# Patient Record
Sex: Female | Born: 1950 | Race: White | Hispanic: No | Marital: Married | Smoking: Former smoker
Health system: Southern US, Community
[De-identification: ages and names within clinical notes are randomized; demographics above are authoritative.]

## PROBLEM LIST (undated history)

## (undated) DIAGNOSIS — K219 Gastro-esophageal reflux disease without esophagitis: Secondary | ICD-10-CM

## (undated) DIAGNOSIS — K2270 Barrett's esophagus without dysplasia: Secondary | ICD-10-CM

## (undated) DIAGNOSIS — K317 Polyp of stomach and duodenum: Secondary | ICD-10-CM

## (undated) DIAGNOSIS — J439 Emphysema, unspecified: Secondary | ICD-10-CM

## (undated) DIAGNOSIS — T7840XA Allergy, unspecified, initial encounter: Secondary | ICD-10-CM

## (undated) DIAGNOSIS — I2510 Atherosclerotic heart disease of native coronary artery without angina pectoris: Secondary | ICD-10-CM

## (undated) DIAGNOSIS — M1990 Unspecified osteoarthritis, unspecified site: Secondary | ICD-10-CM

## (undated) DIAGNOSIS — K2210 Ulcer of esophagus without bleeding: Secondary | ICD-10-CM

## (undated) DIAGNOSIS — B029 Zoster without complications: Secondary | ICD-10-CM

## (undated) DIAGNOSIS — K222 Esophageal obstruction: Secondary | ICD-10-CM

## (undated) HISTORY — PX: JOINT REPLACEMENT: SHX530

## (undated) HISTORY — DX: Emphysema, unspecified: J43.9

## (undated) HISTORY — PX: TOTAL ABDOMINAL HYSTERECTOMY W/ BILATERAL SALPINGOOPHORECTOMY: SHX83

## (undated) HISTORY — DX: Esophageal obstruction: K22.2

## (undated) HISTORY — PX: COMPLETE MASTECTOMY W/ SENTINEL NODE BIOPSY: SHX1383

## (undated) HISTORY — PX: CARPAL TUNNEL RELEASE: SHX101

## (undated) HISTORY — DX: Zoster without complications: B02.9

## (undated) HISTORY — DX: Unspecified osteoarthritis, unspecified site: M19.90

## (undated) HISTORY — DX: Ulcer of esophagus without bleeding: K22.10

## (undated) HISTORY — PX: OTHER SURGICAL HISTORY: SHX169

## (undated) HISTORY — DX: Atherosclerotic heart disease of native coronary artery without angina pectoris: I25.10

## (undated) HISTORY — DX: Polyp of stomach and duodenum: K31.7

## (undated) HISTORY — PX: ABDOMINAL HYSTERECTOMY: SHX81

## (undated) HISTORY — PX: RADIOFREQUENCY ABLATION: SHX2290

## (undated) HISTORY — DX: Gastro-esophageal reflux disease without esophagitis: K21.9

## (undated) HISTORY — PX: CHOLECYSTECTOMY: SHX55

## (undated) HISTORY — DX: Allergy, unspecified, initial encounter: T78.40XA

---

## 2009-01-01 ENCOUNTER — Ambulatory Visit: Payer: Self-pay | Admitting: Family Medicine

## 2009-01-01 DIAGNOSIS — S838X9A Sprain of other specified parts of unspecified knee, initial encounter: Secondary | ICD-10-CM | POA: Insufficient documentation

## 2009-01-01 DIAGNOSIS — S86819A Strain of other muscle(s) and tendon(s) at lower leg level, unspecified leg, initial encounter: Secondary | ICD-10-CM

## 2009-01-01 DIAGNOSIS — M25569 Pain in unspecified knee: Secondary | ICD-10-CM | POA: Insufficient documentation

## 2009-04-03 ENCOUNTER — Ambulatory Visit: Payer: Self-pay | Admitting: Family Medicine

## 2009-04-03 DIAGNOSIS — B029 Zoster without complications: Secondary | ICD-10-CM | POA: Insufficient documentation

## 2010-08-10 HISTORY — PX: NISSEN FUNDOPLICATION: SHX2091

## 2010-08-12 ENCOUNTER — Encounter: Payer: Self-pay | Admitting: Family Medicine

## 2010-08-12 ENCOUNTER — Ambulatory Visit
Admission: RE | Admit: 2010-08-12 | Discharge: 2010-08-12 | Payer: Self-pay | Source: Home / Self Care | Admitting: Family Medicine

## 2010-08-12 DIAGNOSIS — L989 Disorder of the skin and subcutaneous tissue, unspecified: Secondary | ICD-10-CM | POA: Insufficient documentation

## 2010-08-12 DIAGNOSIS — K449 Diaphragmatic hernia without obstruction or gangrene: Secondary | ICD-10-CM | POA: Insufficient documentation

## 2010-08-12 LAB — CONVERTED CEMR LAB
Bilirubin Urine: NEGATIVE
Blood Glucose, Fingerstick: 106
Nitrite: NEGATIVE
Protein, U semiquant: NEGATIVE
pH: 6

## 2010-08-13 ENCOUNTER — Encounter: Payer: Self-pay | Admitting: Family Medicine

## 2010-08-13 LAB — CONVERTED CEMR LAB
ALT: 26 units/L (ref 0–35)
AST: 34 units/L (ref 0–37)
BUN: 18 mg/dL (ref 6–23)
CO2: 26 meq/L (ref 19–32)
Calcium: 9.4 mg/dL (ref 8.4–10.5)
Chloride: 102 meq/L (ref 96–112)
Creatinine, Ser: 0.88 mg/dL (ref 0.40–1.20)
Hgb A1c MFr Bld: 5.6 % (ref <5.7)
Lipase: <10 units/L (ref 0–75)
Sed Rate: 10 mm/hr (ref 0–22)
Total Bilirubin: 0.7 mg/dL (ref 0.3–1.2)

## 2010-08-15 ENCOUNTER — Ambulatory Visit
Admission: RE | Admit: 2010-08-15 | Discharge: 2010-08-15 | Payer: Self-pay | Source: Home / Self Care | Attending: Family Medicine | Admitting: Family Medicine

## 2010-08-15 DIAGNOSIS — K219 Gastro-esophageal reflux disease without esophagitis: Secondary | ICD-10-CM | POA: Insufficient documentation

## 2010-08-31 ENCOUNTER — Encounter: Payer: Self-pay | Admitting: Orthopedic Surgery

## 2010-09-01 ENCOUNTER — Encounter: Payer: Self-pay | Admitting: Family Medicine

## 2010-09-05 ENCOUNTER — Ambulatory Visit: Admit: 2010-09-05 | Payer: Self-pay | Admitting: Family Medicine

## 2010-09-10 DIAGNOSIS — K2210 Ulcer of esophagus without bleeding: Secondary | ICD-10-CM

## 2010-09-10 HISTORY — DX: Ulcer of esophagus without bleeding: K22.10

## 2010-09-11 NOTE — Assessment & Plan Note (Signed)
Summary: Change medication  Insurance company will not pay for Avelox.  Will substitute Cipro and metronidazole. Donna Christen MD  August 12, 2010 7:45 PM    Prescriptions: METRONIDAZOLE 500 MG TABS (METRONIDAZOLE) One by mouth q6hr  #28 x 0   Entered and Authorized by:   Donna Christen MD   Signed by:   Donna Christen MD on 08/12/2010   Method used:   Electronically to        [facility] [phone]* (retail)       [address] [place], [place]  [address]       Ph: [phone] or [phone]       Fax: [phone]   RxID:   [id number] CIPROFLOXACIN HCL 750 MG TABS (CIPROFLOXACIN HCL) One by mouth two times a day  #14 x 0   Entered and Authorized by:   Donna Christen MD   Signed by:   Donna Christen MD on 08/12/2010   Method used:   Elect[facility]ically to        [facility] [phone]* (retail)       [address] [place], [place]  [address]       Ph: [phone] or [phone]       Fax: [phone]   RxID:   [id number]

## 2010-09-11 NOTE — Assessment & Plan Note (Signed)
Summary: NAUESA/ PAIN IN Rachel Carr /EAR PAIN/LEG PAIN (rm 3)   Vital Signs:  Patient Profile:   60 Years Old Female CC:      N/V x 3 months, throat "discomfort" Height:     62 inches Weight:      183 pounds O2 Sat:      97 % O2 treatment:    Room Air Temp:     98.7 degrees F oral Pulse rate:   95 / minute Resp:     18 per minute BP sitting:   120 / 79  (left arm) Cuff size:   regular  Vitals Entered By: Lajean Saver RN (August 12, 2010 9:23 AM)             Is Patient Diabetic? No CBG Result 106      Updated Prior Medication List: No Medications Current Allergies: No known allergies History of Present Illness Chief Complaint: N/V x 3 months, throat "discomfort" History of Present Illness:  Subjective:  Patient complains of several problems: 1)  For about 3 months she has had persistent nocturnal epigastric burning usually associated with nausea and occasional vomiting.  These symptoms usually awaken her at about midnight.  She had no response from Zantac 150mg  daily for about 2 weeks.  The symptoms improve daytime, and she usually only has occasional nausea after eating, but not consistent.  She has seen occasional bright blood in her stool but notes that she has a history of hemorrhoids.  No changes in stool caliber. 2)  During the past 2 to 3 days she has developed URI symptoms with sore throat, nasal congestion, and cough.  No fever, although she has felt warm.  No pleuritic pain or shortness of breath.  She states that she had pneumonia last year. She has been more fatigued than usual.  She smokes, but has almost quit. 3)  She notes that she has had increased urine frequency for about 3 months and often feels thirsty. 4)  She complains of a mole on her left chest lateral to the breast that has changed color, but has not been painful.  Past history of cholecystectomy, partial hysterectomy because of endometriosis (left ovary remains), and appendectomy. Family  history of diabetes in 4 sisters.  REVIEW OF SYSTEMS Constitutional Symptoms      Denies fever, chills, night sweats, weight loss, weight gain, and fatigue.  Eyes       Denies change in vision, eye pain, eye discharge, glasses, contact lenses, and eye surgery. Ear/Nose/Throat/Mouth       Complains of ear pain and sinus problems.      Denies hearing loss/aids, change in hearing, ear discharge, dizziness, frequent runny nose, frequent nose bleeds, sore throat, hoarseness, and tooth pain or bleeding.  Respiratory       Complains of productive cough.      Denies dry cough, wheezing, shortness of breath, asthma, bronchitis, and emphysema/COPD.  Cardiovascular       Denies murmurs, chest pain, and tires easily with exhertion.    Gastrointestinal       Complains of nausea/vomiting and blood in bowel movements.      Denies stomach pain, diarrhea, constipation, and indigestion. Genitourniary       Denies painful urination, kidney stones, and loss of urinary control. Neurological       Complains of tingling.      Denies paralysis, seizures, and fainting/blackouts.      Comments: tingling in right thigh Musculoskeletal  Denies muscle pain, joint pain, joint stiffness, decreased range of motion, redness, swelling, muscle weakness, and gout.  Skin       Denies bruising, unusual mles/lumps or sores, and hair/skin or nail changes.  Psych       Denies mood changes, temper/anger issues, anxiety/stress, speech problems, depression, and sleep problems. Other Comments: Patinet c/o of vomiting 2-3 xs/week x 3 months which she attributed to stress. For the past 2 weeks she has has blood in her stool. She also c/o feeling like something is "stuck in her throat".  2 days ago she developed a cold. Her appetite is unchanged an denies fever. She stopped seeingher PCP because she felt they didnt give her attentive care. I gave heer a card for [place]   Past History:  Past Medical History: Reviewed history  from 01/01/2009 and no changes required. Unremarkable  Past Surgical History: Reviewed history from 01/01/2009 and no changes required. Rt knee surgery Cholecystectomy Hysterectomy  Family History: Mother, Heart Disease Father, D, Prostate CA Brother, Kidney CA Sister, Esophogus CA, DM  Social History: Reviewed history from 01/01/2009 and no changes required. 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  Extraocular movement is intact.  Conjunctivae are not inflamed.  Ears:  Canals normal.  Tympanic membranes normal.   Nose:  Normal septum.  Normal turbinates, mildly congested.  Mild maxillary sinus tenderness present.  Pharynx:  Normal  Neck:  Supple.  Slightly tender shotty posterior nodes are palpated bilaterally.  Lungs:  Clear to auscultation.  Breath sounds are equal.  Heart:  Regular rate and rhythm without murmurs, rubs, or gallops.  Abdomen:  Vague sub-xiphoid epigastric tenderness without masses or hepatosplenomegaly.  There is also mild tenderness over the descending colon.   Bowel sounds are present.  No CVA or flank tenderness.  Extremities:  No edema.  Pedal pulses are full and equal.  Skin:  On the left chest above breast is a benign appearing tan nodule with discrete edges that is suggestive of a seborrheic keratosis. CBC: 12.7 with increased GR 78.5; Hgb 13.9 Assessment New Problems: DIABETES MELLITUS, FAMILY HX (ICD-V18.0) SKIN LESION (ICD-709.9) ABDOMINAL PAIN (ICD-789.00) UPPER RESPIRATORY INFECTION, ACUTE (ICD-465.9) DIAPHRAGMAT HERN W/O MENTION OBSTRUCTION/GANGREN (ICD-553.3)  SUSPECT COMBINATION OF GERD (?ULCER) AND POSSIBLE DIVERTICULITIS.  NOW WITH ONSET OF VIRAL URI.  PATIENT AT RISK FOR TYPE 2 DIABETES.  Plan New Medications/Changes: BENZONATATE 200 MG CAPS (BENZONATATE) One by mouth hs as needed cough   #12 x 0, 08/12/2010, Donna Christen MD AVELOX 400 MG TABS (MOXIFLOXACIN HCL) 1 by mouth daily  #7 x 0, 08/12/2010, Donna Christen MD OMEPRAZOLE 20 MG CPDR (OMEPRAZOLE) 1 by mouth qpm, before eating  #15 x 2, 08/12/2010, Donna Christen MD  New Orders: Urinalysis [CPT-81003] CBC w/Diff [60454-09811] T-CMP with estimated GFR [[address]] T-Amylase [82150-23210] T-Lipase [83690-23215] T- Sed rate non-auto [85651] T- Hemoglobin A1C [83036-23375] Est. Patient Level IV [91478] Planning Comments:   Will treat empirically for diverticulitis:  begin clear liquids and slowly advance.  Begin Avelox for one week. Begin trial of omeprazole for GERD Treat URI with expectorant and  cough suppressant at bedtime. Labs pending:  CMP, amylase/lipase, Sed rate, Hgb A1c Follow-up with PCP within one week.  May need GI referral.  Consider biopsy of lesion left chest. Encouraged to discontinue smoking. Recommend flu shot when well Return for worsening symptoms.  The patient and/or caregiver has been counseled thoroughly with regard to medications prescribed including dosage, schedule, interactions, rationale for use, and possible side effects and they verbalize understanding.  Diagnoses and expected course of recovery discussed and will return if not improved as expected or if the condition worsens. Patient and/or caregiver verbalized understanding.  Prescriptions: BENZONATATE 200 MG CAPS (BENZONATATE) One by mouth hs as needed cough  #12 x 0   Entered and Authorized by:   Donna Christen MD   Signed by:   Donna Christen MD on 08/12/2010   Method used:   Print then Give to Patient   RxID:   [id number] AVELOX 400 MG TABS (MOXIFLOXACIN HCL) 1 by mouth daily  #7 x 0   Entered and Authorized by:   Donna Christen MD   Signed by:   Donna Christen MD on 08/12/2010   Method used:   Print then Give to Patient   RxID:   [id number] OMEPRAZOLE 20 MG CPDR (OMEPRAZOLE) 1 by mouth qpm, before eating   #15 x 2   Entered and Authorized by:   Donna Christen MD   Signed by:   Donna Christen MD on 08/12/2010   Method used:   Print then Give to Patient   RxID:   [id number]   Patient Instructions: 1)  May use Mucinex D (guaifenesin with decongestant) twice daily for congestion. 2)  Begin clear liquids today then gradually advance diet. 3)  May use Afrin nasal spray (or generic oxymetazoline) twice daily for about 5 days.  Also recommend using saline nasal spray several times daily and/or saline nasal irrigation. 4)  Followup with family doctor in about 7 to 10 days.  Return for worsening symptoms.  Orders Added: 1)  Urinalysis [CPT-81003] 2)  CBC w/Diff [66440-34742] 3)  T-CMP with estimated GFR [[address]] 4)  T-Amylase [82150-23210] 5)  T-Lipase [83690-23215] 6)  T- Sed rate non-auto [85651] 7)  T- Hemoglobin A1C [83036-23375] 8)  Est. Patient Level IV [59563]    Laboratory Results   Urine Tests  Date/Time Received: August 12, 2010 10:25 AM  Date/Time Reported: August 12, 2010 10:25 AM   Routine Urinalysis   Color: yellow Appearance: Cloudy Glucose: negative   (Normal Range: Negative) Bilirubin: negative   (Normal Range: Negative) Ketone: negative   (Normal Range: Negative) Spec. Gravity: 1.020   (Normal Range: 1.003-1.035) Blood: trace-lysed   (Normal Range: Negative) pH: 6.0   (Normal Range: 5.0-8.0) Protein: negative   (Normal Range: Negative) Urobilinogen: 0.2   (Normal Range: 0-1) Nitrite: negative   (Normal Range: Negative) Leukocyte Esterace: trace   (Normal Range: Negative)     Blood Tests     CBG Random:: 106mg /dL

## 2010-09-11 NOTE — Assessment & Plan Note (Signed)
Summary: NOV reflux/ vomitting   Vital Signs:  Patient profile:   60 year old female Height:      62 inches Weight:      184 pounds BMI:     33.78 O2 Sat:      99 % on Room air Temp:     97.5 degrees F oral Pulse rate:   77 / minute BP sitting:   125 / 85  (left arm) Cuff size:   large  Vitals Entered By: Payton Spark CMA (August 15, 2010 9:43 AM)  O2 Flow:  Room air CC: New to est. F/U UC. Discuss labs from UC.    Primary Care Provider:  Seymour Bars DO  CC:  New to est. F/U UC. Discuss labs from UC. Marland Kitchen  History of Present Illness: 60 yo WF presents for heartburn with associated nocturnal nausea and vomitting for the past 3 months, occuring just about every night regardless of what she's had for dinner.  She is starting to get more daytime symptoms but no vomitting.  She tried OTC antacids and H2 blockers which did not help.  She went to UC last wk and was put on Cipro + Flagyl for ? infection.  She was started on Omeprazole but this has not helped at all.  She does not drink ETOH or take NSAIDs.  She has seen some blood in her vomit and some blackness in her stools.  She is not having much epigastric pain but she has some early satiety and bloating.  She has yet to have a colonoscopy since turning 50.  She is a smoker and has fam hx of esophageal cancer.  She has lost about 5 lbs in the past month.  Her CMP and Lipase came back normal at [facility].    Current Medications (verified): 1)  Omeprazole 20 Mg Cpdr (Omeprazole) .Marland Kitchen.. 1 By Mouth Qpm, Before Eating 2)  Benzonatate 200 Mg Caps (Benzonatate) .... One By Mouth Hs As Needed Cough 3)  Ciprofloxacin Hcl 750 Mg Tabs (Ciprofloxacin Hcl) .... One By Mouth Two Times A Day 4)  Metronidazole 500 Mg Tabs (Metronidazole) .... 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    Review of Systems       no fevers/sweats/weakness, unexplained wt loss/gain, no change in vision, no difficulty hearing, +ringing in ears, no hay fever/allergies, no CP/discomfort, no palpitations, no breast lump/nipple discharge, no cough/wheeze, + blood in stool, +N/Vno /D, + nocturia, no leaking urine, no unusual vag bleeding, no vaginal/penile discharge, + muscle/joint pain, no rash, no new/changing mole, no HA, no memory loss, no anxiety, no sleep problem, no depression, no unexplained lumps, no easy bruising/bleeding, no concern with sexual function   Physical Exam  General:  alert, well-developed, well-nourished, well-hydrated, and overweight-appearing.   Head:  normocephalic and atraumatic.   Eyes:  sclera non icteric Mouth:  pharynx pink and moist and fair dentition.   Neck:  supple and no masses.   Lungs:  normal respiratory effort, no intercostal retractions, no accessory muscle use, and normal breath sounds.   Heart:  normal rate, regular rhythm, and no murmur.   Abdomen:  soft.  mildly distended.  No epigastric tenderness or guarding.  NABS.  No HSM.   Extremities:  no UE  or LE edema Skin:  no pallor or jaundice Cervical Nodes:  No lymphadenopathy noted Psych:  good eye contact, not anxious appearing, and not depressed appearing.     Impression & Recommendations:  Problem # 1:  GERD (ICD-530.81) Severe GERD vs PUD vs obstructive mass likely given her history. Will change her Omeprazole to Dexilant (samples given) and get her in with GI ASAP for EGD. Recommend long term smoking cessation.  Avoid NSAIDs, ASA and ETOH.  Call if symptoms worsen. OK to complete current abx though I am not convinced this is a bacterial process (other than maybe H. Pylori). The following medications were removed from the medication list:    Omeprazole 20 Mg Cpdr (Omeprazole) .Marland Kitchen... 1 by mouth qpm, before  eating Her updated medication list for this problem includes:    Dexilant 60 Mg Cpdr (Dexlansoprazole) .Marland Kitchen... 1 capsule by mouth once daily  Orders: Gastroenterology Referral (GI)  Problem # 2:  VOMITING ALONE (ICD-787.03) Nocturnal vomitting x 3 mos.  Treat as per #1 with EGD/ colonoscopy, stronger PPI. Reglan added to help with gastric emptying.  Small meals, bland diet.   Repeat labs at 3 wk f/u visit. Orders: Gastroenterology Referral (GI)  Complete Medication List: 1)  Ciprofloxacin Hcl 750 Mg Tabs (Ciprofloxacin hcl) .... One by mouth two times a day 2)  Metronidazole 500 Mg Tabs (Metronidazole) .... One by mouth q6hr 3)  Dexilant 60 Mg Cpdr (Dexlansoprazole) .Marland Kitchen.. 1 capsule by mouth once daily 4)  Reglan 10 Mg Tabs (Metoclopramide hcl) .Marland Kitchen.. 1 tab by mouth 30 min before dinner  Patient Instructions: 1)  Stick to bland diet, small portion sizes. 2)  Finish out antibiotics. 3)  Change Omeprazole to Dexilant once daily for acid reflux. 4)  Use Reglan to help with gastric emptying -- take 30 min before dinner.   5)  Return for follow up in 3 wks. 6)  GI appt will be made. Prescriptions: REGLAN 10 MG TABS (METOCLOPRAMIDE HCL) 1 tab by mouth 30 min before dinner  #30 x 0   Entered and Authorized by:   Seymour Bars DO   Signed by:   Seymour Bars DO on 08/15/2010   Method used:   [facility] to        [facility] [phone]* (retail)       [address] [place], [place]  [address]       Ph: [phone] or [phone]       Fax: [phone]   RxID:   [id number]    Orders Added: 1)  Gastroenterology Referral [GI] 2)  New Patient Level III [phone]

## 2010-09-11 NOTE — Letter (Signed)
Summary: Handout Printed  Printed Handout:  - Clear Liquid Diet-Brief 

## 2010-09-12 ENCOUNTER — Ambulatory Visit (INDEPENDENT_AMBULATORY_CARE_PROVIDER_SITE_OTHER): Payer: BC Managed Care – PPO | Admitting: Family Medicine

## 2010-09-12 ENCOUNTER — Encounter: Payer: Self-pay | Admitting: Family Medicine

## 2010-09-12 DIAGNOSIS — K219 Gastro-esophageal reflux disease without esophagitis: Secondary | ICD-10-CM

## 2010-09-12 DIAGNOSIS — R1110 Vomiting, unspecified: Secondary | ICD-10-CM

## 2010-09-15 LAB — CONVERTED CEMR LAB
ALT: 19 units/L (ref 0–35)
Albumin: 4.3 g/dL (ref 3.5–5.2)
Basophils Absolute: 0.1 10*3/uL (ref 0.0–0.1)
CO2: 27 meq/L (ref 19–32)
Calcium: 9.4 mg/dL (ref 8.4–10.5)
Chloride: 103 meq/L (ref 96–112)
Cholesterol: 203 mg/dL — ABNORMAL HIGH (ref 0–200)
Glucose, Bld: 78 mg/dL (ref 70–99)
Lymphocytes Relative: 32 % (ref 12–46)
Lymphs Abs: 2.7 10*3/uL (ref 0.7–4.0)
Neutro Abs: 4.4 10*3/uL (ref 1.7–7.7)
Neutrophils Relative %: 52 % (ref 43–77)
Platelets: 249 10*3/uL (ref 150–400)
Potassium: 4.9 meq/L (ref 3.5–5.3)
RDW: 13.3 % (ref 11.5–15.5)
Sodium: 139 meq/L (ref 135–145)
Total Bilirubin: 0.4 mg/dL (ref 0.3–1.2)
Total Protein: 6.9 g/dL (ref 6.0–8.3)
VLDL: 24 mg/dL (ref 0–40)
WBC: 8.5 10*3/uL (ref 4.0–10.5)

## 2010-09-17 NOTE — Assessment & Plan Note (Signed)
Summary: f/u vomitting   Vital Signs:  Patient profile:   60 year old female Height:      62 inches Weight:      184 pounds BMI:     33.78 O2 Sat:      99 % on Room air Pulse rate:   65 / minute BP sitting:   126 / 78  (left arm) Cuff size:   large  Vitals Entered By: Payton Spark CMA (September 12, 2010 10:19 AM)  O2 Flow:  Room air CC: F/U. No better.   Primary Care Provider:  Seymour Bars DO  CC:  F/U. No better.Rachel Carr  History of Present Illness: 60 yo WF presents for f/u severe GERD with nocturnal vomitting.  She did see Dr Jason Fila.  She is scheduled for an EGD on 2-14.  She was started on Dexilant which is not doing anything and Reglan before meals which is also not helping.  She continues to deny wt loss, melena, hematochezia, constipation, fevers or nightsweats.  She does not take NSAIDs or drink ETOH.  Cipro and Flagyl given by UC did not help either.  She c/o mostly epigastric pain for about an hr after eating (almost anything) and then vomitts w/ little nausea.    Current Medications (verified): 1)  Dexilant 60 Mg Cpdr (Dexlansoprazole) .Rachel Carr.. 1 Capsule By Mouth Once Daily 2)  Reglan 10 Mg Tabs (Metoclopramide Hcl) .Rachel Carr.. 1 Tab By Mouth 30 Min Before Dinner  Allergies (verified): No Known Drug Allergies  Past History:  Past Medical History: Reviewed history from 01/01/2009 and no changes required. Unremarkable  Past Surgical History: Reviewed history from 08/15/2010 and no changes required. Rt knee surgery Cholecystectomy Hysterectomy for endometriosis carpal tunnel  Family History: Reviewed history from 08/12/2010 and no changes required. Mother, Heart Disease Father, D, Prostate CA Brother, Kidney CA Sister, Esophogus CA, DM  Social History: Reviewed history from 08/15/2010 and no changes required. 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    Review of Systems      See HPI  Physical Exam  General:  alert,  well-developed, well-nourished, well-hydrated, and overweight-appearing.   Head:  normocephalic and atraumatic.   Eyes:  sclera non icteric Mouth:  pharynx pink and moist.   Neck:  no masses.   Lungs:  normal respiratory effort, no intercostal retractions, no accessory muscle use, and normal breath sounds.   Heart:  normal rate, regular rhythm, and no murmur.   Abdomen:  soft.  mildly distended.  mild  epigastric tenderness  with  guarding.  NABS.  No HSM.   Extremities:  no LE edema Skin:  color normal.  no pallor or jaundice Psych:  good eye contact, not anxious appearing, and not depressed appearing.     Impression & Recommendations:  Problem # 1:  VOMITING ALONE (ICD-787.03) Mostly nocturnal postprandial vomitting after having epigastric pain x 1 hr following consumption of a meal (of almost anything).  She saw Dr Jason Fila and is scheduled for an EGD/ colonoscopy 2-14.  Will replace Reglan with Zofran for now to see if this helps some.  Stick to a soft, bland diet and stay off NSAIDs and ETOH.  No sign of clinical dehydration.   Orders: T-CBC w/Diff ([id number])  Problem # 2:  GERD (ICD-530.81) Though not helping much, she can stay on Dexilant until she has her EGD. Her updated medication list for this problem includes:    Dexilant 60 Mg Cpdr (Dexlansoprazole) .Rachel KitchenMarland KitchenMarland KitchenMarland Carr  1 capsule by mouth once daily  Complete Medication List: 1)  Dexilant 60 Mg Cpdr (Dexlansoprazole) .Rachel Carr.. 1 capsule by mouth once daily 2)  Zofran 8 Mg Tabs (Ondansetron hcl) .Rachel Carr.. 1 tab by mouth q 8 hrs as needed nausea  Other Orders: T-Comprehensive Metabolic Panel (16109-60454) T-Lipid Profile (09811-91478)  Patient Instructions: 1)  Use Maalox in the morning and at night. 2)  Take Zofran up to 3 x a day for nausea. 3)  Labs today. 4)  Will call you w/ results on Monday. 5)  F/U with Dr Jason Fila for EGD/ colonoscopy. 6)  REturn for f/u in 4 wks. Prescriptions: ZOFRAN 8 MG TABS (ONDANSETRON HCL) 1 tab by mouth q 8 hrs  as needed nausea  #24 x 0   Entered and Authorized by:   Seymour Bars DO   Signed by:   Seymour Bars DO on 09/12/2010   Method used:   Electronically to        [facility] [phone]* (retail)       [address] [place], [place]  [address]       Ph: [phone] or [phone]       Fax: [phone]   RxID:   [id number]    Orders Added: 1)  T-CBC w/Diff [43329-51884] 2)  T-Comprehensive Metabolic Panel [80053-22900] 3)  T-Lipid Profile [80061-22930] 4)  Est. Patient Level III [16606]

## 2010-09-24 ENCOUNTER — Encounter: Payer: Self-pay | Admitting: Family Medicine

## 2010-09-29 ENCOUNTER — Encounter: Payer: Self-pay | Admitting: Family Medicine

## 2010-10-01 NOTE — Miscellaneous (Signed)
Summary: EGD/ COLONOSCOPY  Clinical Lists Changes  Observations: Added new observation of COLONRECACT: Repeat colonoscopy in 5 years.  PATH PENDING DEXILANT STARTED REPEAT EGD IN 8-12 WKS. (09/23/2010 12:50) Added new observation of COLONOSCOPY: Location:  [facility].    COLON / EGD  ABNORMAL MUCOSA IN THE WHOLE STOMACH (GASTRITIS) ULCER IN THE DISTAL ESOPHAGUS POLYP IN THE ILEO-CECAL VALVE POLYP IN THE SIGMOID COLON POLYPS IN THE RECTUM O/W NORMAL  (09/23/2010 12:50)      Colonoscopy  Procedure date:  09/23/2010  Findings:      Location:  [facility].    COLON / EGD  ABNORMAL MUCOSA IN THE WHOLE STOMACH (GASTRITIS) ULCER IN THE DISTAL ESOPHAGUS POLYP IN THE ILEO-CECAL VALVE POLYP IN THE SIGMOID COLON POLYPS IN THE RECTUM O/W NORMAL   Comments:      Repeat colonoscopy in 5 years.  PATH PENDING DEXILANT STARTED REPEAT EGD IN 8-12 WKS.   Colonoscopy  Procedure date:  09/23/2010  Findings:      Location:  [facility].    COLON / EGD  ABNORMAL MUCOSA IN THE WHOLE STOMACH (GASTRITIS) ULCER IN THE DISTAL ESOPHAGUS POLYP IN THE ILEO-CECAL VALVE POLYP IN THE SIGMOID COLON POLYPS IN THE RECTUM O/W NORMAL   Comments:      Repeat colonoscopy in 5 years.  PATH PENDING DEXILANT STARTED REPEAT EGD IN 8-12 WKS.

## 2010-10-01 NOTE — Consult Note (Signed)
Summary: [facility]  [facility]   Imported By: Lanelle Bal 09/23/2010 12:55:20  _____________________________________________________________________  External Attachment:    Type:   Image     Comment:   External Document

## 2010-10-10 ENCOUNTER — Encounter: Payer: Self-pay | Admitting: Family Medicine

## 2010-10-10 ENCOUNTER — Ambulatory Visit (INDEPENDENT_AMBULATORY_CARE_PROVIDER_SITE_OTHER): Payer: BC Managed Care – PPO | Admitting: Family Medicine

## 2010-10-10 DIAGNOSIS — K2900 Acute gastritis without bleeding: Secondary | ICD-10-CM | POA: Insufficient documentation

## 2010-10-10 DIAGNOSIS — D126 Benign neoplasm of colon, unspecified: Secondary | ICD-10-CM | POA: Insufficient documentation

## 2010-10-10 DIAGNOSIS — K2210 Ulcer of esophagus without bleeding: Secondary | ICD-10-CM

## 2010-10-16 NOTE — Letter (Signed)
Summary: Letter with Path Results/[facility]  Letter with Path Results/[facility]   Imported By: Lanelle Bal 10/09/2010 12:38:26  _____________________________________________________________________  External Attachment:    Type:   Image     Comment:   External Document

## 2010-10-16 NOTE — Assessment & Plan Note (Signed)
Summary: f/u EGD   Vital Signs:  Patient profile:   60 year old female Height:      62 inches Weight:      183 pounds BMI:     33.59 O2 Sat:      97 % on Room air Pulse rate:   83 / minute BP sitting:   125 / 83  (left arm) Cuff size:   large  Vitals Entered By: Payton Spark CMA (October 10, 2010 10:51 AM)  O2 Flow:  Room air CC: F/U vomiting   Primary Care Provider:  Seymour Bars DO  CC:  F/U vomiting.  History of Present Illness: Rachel yo WF prsents for f/u visit.  She had her EGD/ colonoscopy done last month for recurrent vomitting with dyspepsia and was found to have colon polyps (due for repeat in 5 yrs- colonoscopy), an ulcer in the distal esophagus and gastritis.  She was put on Pantoprazole once daily.  She continues to have vomitting, mostly late at night but not every night now.  Denies epigastric pain or heartburn.  Tried Zofran but this did not help.  Denies melena or hematochezia.  not taking NSAIDs or drinking ETOH.  Says it doesn't really matter what she eats.  She goes back for repeat EGD in about 6 wks.  Current Medications (verified): 1)  Zofran 8 Mg Tabs (Ondansetron Hcl) .Marland Kitchen.. 1 Tab By Mouth Q 8 Hrs As Needed Nausea  Allergies (verified): No Known Drug Allergies  Past History:  Past Medical History: esophageal ulcer/ gastritis - Dr Jason Fila 09-2010  Past Surgical History: Rt knee surgery Cholecystectomy Hysterectomy for endometriosis carpal tunnel colonoscopy 2012-- due for repeat 2017 for polyps  Social History: Reviewed history from 08/15/2010 and no changes required. 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    Review of Systems      See HPI  Physical Exam  General:  alert, well-developed, well-nourished, well-hydrated, and overweight-appearing.   Head:  normocephalic and atraumatic.   Eyes:  sclera non icteric Mouth:  pharynx pink and moist.   Lungs:  normal respiratory effort, no intercostal  retractions, no accessory muscle use, and normal breath sounds.   Heart:  normal rate, regular rhythm, and no murmur.   Abdomen:  soft.  slight epigastric and periumbilical TTP w/o much guarding. Skin:  color normal.  no pallor or jaundice Psych:  good eye contact, not anxious appearing, and not depressed appearing.     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  Denies anorexia or pain but continues to have some vomitting (slightly less) from before.  due for repeat EGD with Dr Jason Fila in about 6 wks.    Reviewed w/ pt to be sure she was not on high risk meds or products:  no ETOH, NSAIDs, bispophonates, ASA. She is to continue eating small meals.    Problem # 2:  ACUTE GASTRITIS WITHOUT MENTION OF HEMORRHAGE (ICD-535.00) As per #1.  Because her vomitting is at night and she gets anxious about waking up like this everynight, I wonder if there is a certain anxiety component given her lack of pain.  will start Lorazepam every night for the next month to see if this helps. The following medications were removed from the medication list:    Dexilant 60 Mg Cpdr (Dexlansoprazole) .Marland Kitchen... 1 capsule by mouth once daily Her updated medication list for this problem  includes:    Pantoprazole Sodium 40 Mg Tbec (Pantoprazole sodium) .Marland Kitchen... 1 tab by mouth daily  Problem # 3:  COLONIC POLYPS (ICD-211.3) Reviewed w/ pt findings on recent colonoscopy.  Repeat in 5 yrs.  Doing well after procedure.  Complete Medication List: 1)  Lorazepam 1 Mg Tabs (Lorazepam) .Marland Kitchen.. 1 tab by mouth at bedtime as needed anxiety 2)  Pantoprazole Sodium 40 Mg Tbec (Pantoprazole sodium) .Marland Kitchen.. 1 tab by mouth daily  Patient Instructions: 1)  Stay on Pantoprazole and f/u with dr Jason Fila for esophageal ulcer/ gastritis.  2)  Start Lorazepam 1 tab at 8 pm each night.   3)  Call if any problems. 4)  Return for a PHYSICAL in 3 mos. Prescriptions: LORAZEPAM 1  MG TABS (LORAZEPAM) 1 tab by mouth at bedtime as needed anxiety  #30 x 0   Entered and Authorized by:   Seymour Bars DO   Signed by:   Seymour Bars DO on 10/10/2010   Method used:   Printed then faxed to ...       [facility]  [place] [phone]* (retail)       [address] [place], [place]  [address]       Ph: [phone] or [phone]       Fax: [phone]   RxID:   [id number]    Orders Added: 1)  Est. Patient Level IV [36644]

## 2010-12-09 ENCOUNTER — Telehealth: Payer: Self-pay | Admitting: *Deleted

## 2010-12-09 NOTE — Telephone Encounter (Signed)
I have not seen her in months.  Will not call in sleep meds as all of them can be habit forming. She is free to try OTC Unisom prn.

## 2010-12-09 NOTE — Telephone Encounter (Signed)
Pt left message requesting soemthing to help her sleep.  She is seeing Dr. Huston Foley on 12/20/10, but is having trouble sleeping and is still having nausea at night.  Please advise.

## 2011-01-02 ENCOUNTER — Encounter: Payer: Self-pay | Admitting: Family Medicine

## 2011-01-02 ENCOUNTER — Ambulatory Visit (INDEPENDENT_AMBULATORY_CARE_PROVIDER_SITE_OTHER): Payer: BC Managed Care – PPO | Admitting: Family Medicine

## 2011-01-02 VITALS — BP 137/92 | HR 66 | Ht 62.0 in | Wt 180.0 lb

## 2011-01-02 DIAGNOSIS — F17200 Nicotine dependence, unspecified, uncomplicated: Secondary | ICD-10-CM

## 2011-01-02 MED ORDER — BUPROPION HCL ER (SR) 150 MG PO TB12: 150.0000 mg | ORAL_TABLET | Freq: Two times a day (BID) | ORAL | Status: DC

## 2011-01-02 NOTE — Assessment & Plan Note (Signed)
Counseled face to face on smoking cessation for 15 min.  She is motivated to quit and had SEs on Chantix in the past.  She has cut herself back from 2 to 1/2 ppd and does not want to do nicotine replacement.  Start Wellbutrin 150 mg once a day for 3 days then increase to bid.  Call if any problems. Plan quit date for 7 days from now and stay on medicine for 3 mos.  We discussed the behavior changes needed with smoking cessation.

## 2011-01-02 NOTE — Patient Instructions (Signed)
Start Buproprion 150 mg in the mornings for 3 days then increase to taking 1 tab in the morning and 1 tab at night. Set quit take for 5-7 days from now.    Plan to stay on this for 3 months.  Call me if any problems.

## 2011-01-02 NOTE — Progress Notes (Signed)
  Subjective:    Patient ID: JAUNITA MIKELS, female    DOB: [birth year], 60 y.o.   MRN: [id number]  HPI 60 yo WF presents for smoking cessation.  She tried Chantix for a wk but she had adverse SEs.  She has also tried the patch.  She has cut back from 2 ppd to 1/2 ppd.  Her husband quit smoking and she has a good support system.  She is interested in wellbutrin.  She is very motivated to quit right now.  Tried nicotrol inhaler but didn't like it.  BP 137/92  Pulse 66  Ht 5\' 2"  (1.575 m)  Wt 180 lb (81.647 kg)  BMI 32.92 kg/m2  SpO2 99%   Review of Systems  HENT: Negative for congestion.   Respiratory: Positive for cough.        Objective:   Physical Exam  Constitutional: She appears well-developed and well-nourished.  Skin: Skin is warm and dry.  Psychiatric: She has a normal mood and affect.          Assessment & Plan:

## 2011-01-11 ENCOUNTER — Encounter: Payer: Self-pay | Admitting: Family Medicine

## 2011-01-13 ENCOUNTER — Encounter: Payer: Self-pay | Admitting: Family Medicine

## 2011-01-13 ENCOUNTER — Ambulatory Visit (INDEPENDENT_AMBULATORY_CARE_PROVIDER_SITE_OTHER): Payer: BC Managed Care – PPO | Admitting: Family Medicine

## 2011-01-13 VITALS — BP 117/82 | HR 71 | Ht 62.0 in | Wt 181.0 lb

## 2011-01-13 DIAGNOSIS — F17200 Nicotine dependence, unspecified, uncomplicated: Secondary | ICD-10-CM

## 2011-01-13 DIAGNOSIS — K2270 Barrett's esophagus without dysplasia: Secondary | ICD-10-CM | POA: Insufficient documentation

## 2011-01-13 MED ORDER — PANTOPRAZOLE SODIUM 40 MG PO TBEC: 40.0000 mg | DELAYED_RELEASE_TABLET | Freq: Two times a day (BID) | ORAL | Status: DC

## 2011-01-13 MED ORDER — DIAZEPAM 5 MG PO TABS: 5.0000 mg | ORAL_TABLET | Freq: Every evening | ORAL | Status: AC | PRN

## 2011-01-13 NOTE — Patient Instructions (Signed)
Keep up the good work with smoking cessation.  When your Nexium samples run out, change to Pantoprazole twice daily for reflux.  Stop drinking soda.  Stay on Wellbutrin.  Add Diazepam 5 mg at night for sleep/ anxiety/ see if it helps nighttime vomitting.

## 2011-01-13 NOTE — Assessment & Plan Note (Signed)
I will review her notes from Dr Jason Fila.  Due to cost,  I changed her Nexium to Pantoprazole, keeping it BID.  Reviewed diet- small meals, bland diet and recommend avoiding soda.  Given occurrence of vomitting mostly at night, I do wonder if he has some cyclic vomitting or anxiety component.  Will add Diazepam 5 mg at bedtime.  Call if any problems.

## 2011-01-13 NOTE — Assessment & Plan Note (Signed)
Doing well.  Started wellbutrin 1 month ago and is down to 3 cigs/ day w/o SEs.  Keep up the good work and stay on wellbutrin for 3-4 mos.

## 2011-01-13 NOTE — Progress Notes (Signed)
  Subjective:    Patient ID: Rachel Carr, female    DOB: 24-Aug-1950, 60 y.o.   MRN: [id number]  HPI  60 yo WF presents for continued problems with vomitting and heartburn.  She had gastric emptying scan that came back normal and did have an EGD that was + for Barretts esophagus.  She is on Nexium bid which has some.  She has some anxiety but denies any acute stressors.  She has cut back on her smoking and is down to 3 cigarettes/ day.  She has been on wellbutrin for the past 4 wks and done well on it.  She is trying to eat bland foods, small meals at night, elevated the HOB at night but is still drinking soda.  Denies melena, hematochezia or wt loss.    BP 117/82  Pulse 71  Ht 5\' 2"  (1.575 m)  Wt 181 lb (82.101 kg)  BMI 33.11 kg/m2  SpO2 98%    Review of Systems  Constitutional: Negative for fever, chills and fatigue.  HENT: Positive for sore throat.   Gastrointestinal: Positive for nausea and vomiting. Negative for abdominal pain and diarrhea.       Objective:   Physical Exam  Constitutional: She appears well-developed and well-nourished. No distress.       obese  Eyes: No scleral icterus.  Neck: Neck supple.  Cardiovascular: Normal rate, regular rhythm and normal heart sounds.   Pulmonary/Chest: Effort normal and breath sounds normal.  Abdominal: Bowel sounds are normal. She exhibits no distension. There is no tenderness. There is no guarding.  Musculoskeletal: She exhibits no edema.  Lymphadenopathy:    She has no cervical adenopathy.  Skin: Skin is warm and dry.  Psychiatric: She has a normal mood and affect.          Assessment & Plan:

## 2011-04-23 ENCOUNTER — Encounter: Payer: Self-pay | Admitting: Family Medicine

## 2011-05-05 ENCOUNTER — Encounter: Payer: BC Managed Care – PPO | Admitting: Family Medicine

## 2011-05-05 DIAGNOSIS — Z0289 Encounter for other administrative examinations: Secondary | ICD-10-CM

## 2011-06-01 ENCOUNTER — Encounter: Payer: Self-pay | Admitting: Family Medicine

## 2011-06-01 ENCOUNTER — Inpatient Hospital Stay (INDEPENDENT_AMBULATORY_CARE_PROVIDER_SITE_OTHER)
Admission: RE | Admit: 2011-06-01 | Discharge: 2011-06-01 | Disposition: A | Discharge status: Home / Self Care | Payer: BC Managed Care – PPO | Source: Ambulatory Visit | Attending: Family Medicine | Admitting: Family Medicine

## 2011-06-01 DIAGNOSIS — K120 Recurrent oral aphthae: Secondary | ICD-10-CM

## 2011-06-01 DIAGNOSIS — J0100 Acute maxillary sinusitis, unspecified: Secondary | ICD-10-CM

## 2011-06-05 ENCOUNTER — Other Ambulatory Visit: Payer: Self-pay | Admitting: Family Medicine

## 2011-07-13 NOTE — Progress Notes (Signed)
Summary: Sinus/Mouth Blister (rm 2)   Vital Signs:  Patient Profile:   60 Years Old Female CC:      sinus pain/pressure and blisters in mouth x 1 week Height:     62 inches Weight:      176 pounds O2 Sat:      97 % O2 treatment:    Room Air Temp:     98.4 degrees F oral Pulse rate:   76 / minute Resp:     14 per minute BP sitting:   123 / 80  (left arm) Cuff size:   large  Vitals Entered By: Lajean Saver RN (June 01, 2011 3:49 PM)                  Updated Prior Medication List: PANTOPRAZOLE SODIUM 40 MG TBEC (PANTOPRAZOLE SODIUM) 1 tab by mouth daily BUPROPION HCL 100 MG TABS (BUPROPION HCL)  REGLAN 5 MG TABS (METOCLOPRAMIDE HCL)   Current Allergies: No known allergies History of Present Illness Chief Complaint: sinus pain/pressure and blisters in mouth x 1 week History of Present Illness:  Subjective: Patient complains of sinus congestion for about a week.  During this time she has developed painful sores on her lower anterior gums. No sore throat No cough No pleuritic pain No wheezing + post-nasal drainage + sinus pain/pressure No itchy/red eyes No earache No hemoptysis No SOB No fever/chills No nausea No vomiting No abdominal pain No diarrhea No skin rashes + fatigue No myalgias No headache    REVIEW OF SYSTEMS Constitutional Symptoms      Denies fever, chills, night sweats, weight loss, weight gain, and fatigue.  Eyes       Denies change in vision, eye pain, eye discharge, glasses, contact lenses, and eye surgery. Ear/Nose/Throat/Mouth       Complains of sinus problems and hoarseness.      Denies hearing loss/aids, change in hearing, ear pain, ear discharge, dizziness, frequent runny nose, frequent nose bleeds, sore throat, and tooth pain or bleeding.      Comments: blisters in mouth Respiratory       Complains of wheezing.      Denies dry cough, productive cough, shortness of breath, asthma, bronchitis, and emphysema/COPD.  Cardiovascular       Denies murmurs, chest pain, and tires easily with exhertion.    Gastrointestinal       Complains of nausea/vomiting.      Denies stomach pain, diarrhea, constipation, blood in bowel movements, and indigestion. Genitourniary       Denies painful urination, blood or discharge from vagina, kidney stones, and loss of urinary control. Neurological       Denies paralysis, seizures, and fainting/blackouts. Musculoskeletal       Denies muscle pain, joint pain, joint stiffness, decreased range of motion, redness, swelling, muscle weakness, and gout.  Skin       Denies bruising, unusual mles/lumps or sores, and hair/skin or nail changes.  Psych       Denies mood changes, temper/anger issues, anxiety/stress, speech problems, depression, and sleep problems. Other Comments: Patient has had GI complications x 1 month. She has been vomiting often and believes this is the cause of her mouth pain and sores. The oral blisters started about the same time has her sinus pain.    Past History:  Past Medical History: Reviewed history from 10/10/2010 and no changes required. esophageal ulcer/ gastritis - Dr Jason Fila 09-2010  Past Surgical History: Reviewed history from 10/10/2010 and no changes required.  Rt knee surgery Cholecystectomy Hysterectomy for endometriosis carpal tunnel colonoscopy 2012-- due for repeat 2017 for polyps  Family History: Reviewed history from 08/12/2010 and no changes required. Mother, Heart Disease Father, D, Prostate CA Brother, Kidney CA Sister, Esophogus CA, DM  Social History: Reviewed history from 08/15/2010 and no changes required. 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     Objective:  No acute distress  Eyes:  Pupils are equal, round, and reactive to light and accomodation.  Extraocular movement is intact.  Conjunctivae are not inflamed.  Ears:  Canals normal.  Tympanic membranes normal.   Nose:  Mildly congested  turbinates.  + maxillary sinus tenderness  Mouth:  Anterior mandibular gingiva slightly inflamed and tender to palpation.  No swelling.  There are several small tender shallow ulcers along the base of anterior gingiva.  No tooth tenderness Pharynx:  Normal  Neck:  Supple.  No adenopathy is present.  No thyromegaly is present  Lungs:  Clear to auscultation.  Breath sounds are equal.  Heart:  Regular rate and rhythm without murmurs, rubs, or gallops.   Assessment New Problems: ACUTE MAXILLARY SINUSITIS (ICD-461.0) APHTHOUS ULCERS (ICD-528.2)  ? GINGIVITIS  Plan New Medications/Changes: AMOXICILLIN 875 MG TABS (AMOXICILLIN) One by mouth two times a day  #20 x 0, 06/01/2011, Donna Christen MD  New Orders: Est. Patient Level IV [16109] Planning Comments:   Begin amoxicillin, expectorant/decongestant, cough suppressant at bedtime.  Increase fluid intake May apply OTC Orabase gel to ulcers on gingiva Recommend daily multi-vitamin supplement with B complex  Recommend evaluation by periodontist Followup with ENT if mouth ulcers not improving 7 to 10 days   The patient and/or caregiver has been counseled thoroughly with regard to medications prescribed including dosage, schedule, interactions, rationale for use, and possible side effects and they verbalize understanding.  Diagnoses and expected course of recovery discussed and will return if not improved as expected or if the condition worsens. Patient and/or caregiver verbalized understanding.  Prescriptions: AMOXICILLIN 875 MG TABS (AMOXICILLIN) One by mouth two times a day  #20 x 0   Entered and Authorized by:   Donna Christen MD   Signed by:   Donna Christen MD on 06/01/2011   Method used:   Print then Give to Patient   RxID:   [id number]   Patient Instructions: 1)  Take Mucinex D (guaifenesin with decongestant) twice daily for congestion. 2)  Increase fluid intake, rest. 3)  May use Afrin nasal spray (or generic oxymetazoline)  twice daily for about 5 days.  Also recommend using saline nasal spray several times daily and/or saline nasal irrigation. 4)  Apply Orabase gel (OTC) to mouth ulcers 5)  Recommend B complex daily vitamin 6)  Recommend evaluation by periodontist 7)  Followup with ENT doctor if not improving 7 to 10 days.   Orders Added: 1)  Est. Patient Level IV [95621]

## 2011-10-14 ENCOUNTER — Ambulatory Visit (INDEPENDENT_AMBULATORY_CARE_PROVIDER_SITE_OTHER): Payer: BC Managed Care – PPO | Admitting: Family Medicine

## 2011-10-14 ENCOUNTER — Encounter: Payer: Self-pay | Admitting: Family Medicine

## 2011-10-14 VITALS — BP 106/70 | HR 71 | Wt 166.0 lb

## 2011-10-14 DIAGNOSIS — E669 Obesity, unspecified: Secondary | ICD-10-CM

## 2011-10-14 DIAGNOSIS — K2270 Barrett's esophagus without dysplasia: Secondary | ICD-10-CM

## 2011-10-14 DIAGNOSIS — Z1231 Encounter for screening mammogram for malignant neoplasm of breast: Secondary | ICD-10-CM

## 2011-10-14 DIAGNOSIS — J189 Pneumonia, unspecified organism: Secondary | ICD-10-CM

## 2011-10-14 NOTE — Progress Notes (Signed)
  Subjective:    Patient ID: Rachel Carr, female    DOB: 1951/05/15, 61 y.o.   MRN: [id number]  HPI F/U left lingular PNA. Seen at [facility] on [facility]/2010 and then d/c next day on 09/29/10.  D/C Home on [place] x 7 days.  No longer pain or SOB.  Activity level are better. No fever.  I have the admission note but not the discharge summary.    Hx of barretts and intractable vomiting.  Says sees Dr. Adriana Simas for this.  She has called their office to make an appt.    Review of Systems     Objective:   Physical Exam  Constitutional: She is oriented to person, place, and time. She appears well-developed and well-nourished.  HENT:  Head: Normocephalic and atraumatic.  Cardiovascular: Normal rate, regular rhythm and normal heart sounds.   Pulmonary/Chest: Effort normal and breath sounds normal.  Neurological: She is alert and oriented to person, place, and time.  Skin: Skin is warm and dry.  Psychiatric: She has a normal mood and affect. Her behavior is normal.          Assessment & Plan:  Pneumonia-resolving. Symptomatically she is improved. Her pulse ox is still a little low, but her lung exam is normal.  Barrett's esophagus with a retractable vomiting-she will get appointments and with Dr. Adriana Simas to followup on this.  She is interested in pneumococcal vaccine but doesn't want it today. She says she will call back to make a nurse appointment for this. We also discussed getting the shingles vaccine. I encouraged her to check with her insurance first to see what the coverage is.   She is due for mammogram I will put in the order. She's had a hysterectomy so does not need a mammogram I will update her health maintenance.

## 2011-11-24 ENCOUNTER — Encounter: Payer: Self-pay | Admitting: Family Medicine

## 2011-11-24 ENCOUNTER — Ambulatory Visit (INDEPENDENT_AMBULATORY_CARE_PROVIDER_SITE_OTHER): Payer: BC Managed Care – PPO | Admitting: Family Medicine

## 2011-11-24 VITALS — BP 96/64 | HR 88 | Ht 61.0 in | Wt 162.0 lb

## 2011-11-24 DIAGNOSIS — F17200 Nicotine dependence, unspecified, uncomplicated: Secondary | ICD-10-CM

## 2011-11-24 DIAGNOSIS — R1110 Vomiting, unspecified: Secondary | ICD-10-CM

## 2011-11-24 DIAGNOSIS — K219 Gastro-esophageal reflux disease without esophagitis: Secondary | ICD-10-CM

## 2011-11-24 MED ORDER — ONDANSETRON 8 MG PO TBDP: 8.0000 mg | ORAL_TABLET | Freq: Three times a day (TID) | ORAL | Status: AC | PRN

## 2011-11-24 MED ORDER — AZITHROMYCIN 250 MG PO TABS: ORAL_TABLET | ORAL | Status: AC

## 2011-11-24 MED ORDER — METOCLOPRAMIDE HCL 10 MG PO TABS: 10.0000 mg | ORAL_TABLET | Freq: Every day | ORAL | Status: DC

## 2011-11-24 NOTE — Patient Instructions (Signed)
Aspiration Pneumonia Aspiration pneumonia is an infection in your lungs. It occurs when you breathe (aspirate) things into your lungs such as food, vomit, or liquid. When these things get into your lungs, swelling (inflammation) can occur. This can make it difficult for you to breath. Aspiration pneumonia is a serious condition and can be life threatening.  CAUSES  Aspiration pneumonia can have many causes. Some of the causes include:  Having a brain injury or disease:   Stroke.   Seizures.   Confusion (Dementia).   ALS (Amyotrophic Lateral Sclerosis, or Lou Gehrig's disease).   Parkinson's disease.   Other causes of aspiration pneumonia include:   Being under general anesthesia for procedures.   Being in a coma (unconscious). This unconscious state can be caused by medicine, illegal drugs, injury or disease. Being in a coma can decrease a person's cough (gag) reflex. Aspiration can occur when a person is not awake enough to cough if something goes into the lungs.   A narrowing of the esophagus (the tube that carries food to the stomach).   Having dental problems that makes it hard to swallow.   Drinking too much alcohol. If a person passes out and vomits, vomit can be swallowed into the lungs.   Taking certain medications. Tranquilizers and sedatives can sometimes decrease your swallowing or gag reflex.  SYMPTOMS   Coughing after swallowing food or liquids.   Breathing problems. These could include wheezing (a whistling sound) or shortness of breath.   Bluish skin. This can be caused by lack of oxygen.   Coughing up food or mucus. The mucus might contain blood, pus or greenish material.   Fever.   Chest pain.   Fatigue (being more tired than usual).   Sweating more than usual.   Bad breath.  DIAGNOSIS  A physical examination and testing will be needed to see if aspiration pneumonia is present. This can include:  A review of the above symptoms.   Chest X-ray.  This is a picture of the lungs.   Listening to your lungs with a stethoscope. Your healthcare provider will listen for:   Crackling sounds in the lungs.   Decreased breath sounds.   A rapid heartbeat.   Swallowing study. This test looks at how food is swallowed and whether it goes into your breathing tube (trachea) or food pipe (esophagus).   Sputum culture. Sputum (saliva and mucus) is collected from the lungs or bronchi (tubes that carry air to the lungs). It is then tested for bacteria.   Computed tomography. This is called a CT scan. It also can show lung damage.   Bronchoscopy. This test uses a flexible tube (bronchoscope) to see inside the lungs.  TREATMENT  Treatment will depend on how severe the aspiration pneumonia is and what led to it.  Some people may need to be treated in the hospital. Your breathing will be carefully monitored. Depending on how well you are breathing, you may:   Be able to breath on your own but need oxygen.   Need breathing support via a breathing machine (ventilator).   Medication:   Antibiotics or anti-fungal drugs might be prescribed. The particular medicine will depend on what caused the infection.   Other drugs may be given to reduce fever and/or pain.   Other treatments or corrections may be needed. For example:   Following a recommended diet. This is especially important if the swallowing study was failed.   Revising medications.   Fixing dental problems.  Correcting breathing obstructions.   Treating stomach disorders.   Dealing with alcohol issues.   Having a feeding tube placed in the stomach.  HOME CARE INSTRUCTIONS   Take any medicines that were prescribed. Follow the directions carefully.   Check with your caregiver before taking over-the-counter medications.   Rest as instructed by your caregiver.   Keep all follow-up appointments with your healthcare provider. This is important so the caregiver can make sure the  pneumonia is gone.  SEEK MEDICAL CARE IF:  Any of these symptoms return:  Shortness of breath or difficulty breathing.   Wheezing.   Fever of more than 100.5 F (38.1 C) or as recommended by your caregiver.   Chest pain.  MAKE SURE YOU:   Understand these instructions.   Will watch your condition.   Will get help right away if you are not doing well or get worse.  Document Released: 05/24/2009 Document Revised: 07/16/2011 Document Reviewed: 05/24/2009 [facility] Patient Information 2012 [place], [place].

## 2011-11-24 NOTE — Progress Notes (Addendum)
  Subjective:    Patient ID: Rachel Carr, female    DOB: [birth year], 61 y.o.   MRN: [id number]  HPI  Patient w vomiting and HX pf aspiration. Patient has had in the last month or so the hospitalization do to aspiration pneumonia. According to her she is now been diagnosed with having severe esophageal/hiatal hernia that she see a Careers adviser for repair. She reports last night throwing up multiple x5-7 and feeling as if she may have aspirated gastric material.  Review of Systems  HENT: Negative for voice change.   Respiratory: Positive for cough.   Gastrointestinal: Positive for vomiting.  Skin: Negative for color change.  Neurological: Negative for light-headedness.      BP 96/64  Pulse 88  Ht 5\' 1"  (1.549 m)  Wt 162 lb (73.483 kg)  BMI 30.61 kg/m2  SpO2 97% Objective:   Physical Exam  Constitutional: She is oriented to person, place, and time. She appears well-developed and well-nourished.  HENT:  Head: Normocephalic.  Neck: Normal range of motion.  Cardiovascular: Normal rate, regular rhythm and normal heart sounds.   Pulmonary/Chest: Effort normal and breath sounds normal. No respiratory distress. She has no wheezes.  Neurological: She is alert and oriented to person, place, and time.  Skin: Skin is warm.   patient is afebrile.    Assessment & Plan:  #1 history of aspiration pneumonia with prolonged episode of nausea and vomiting last night. While no signs of pneumonia detected at this time because of her history we'll place her on Z-Pak #2 recurrent reflux and vomiting. Recommend she restart her Reglan, take this with her Protonix and Zofran for nausea. Followup in May with surgeon for evaluation of hernia repair Counseled about the need to stop smoking.

## 2011-12-10 DIAGNOSIS — IMO0001 Reserved for inherently not codable concepts without codable children: Secondary | ICD-10-CM | POA: Insufficient documentation

## 2011-12-15 ENCOUNTER — Inpatient Hospital Stay: Admission: RE | Admit: 2011-12-15 | Payer: BC Managed Care – PPO | Source: Ambulatory Visit

## 2012-01-13 ENCOUNTER — Encounter: Payer: Self-pay | Admitting: Family Medicine

## 2012-01-13 ENCOUNTER — Ambulatory Visit (INDEPENDENT_AMBULATORY_CARE_PROVIDER_SITE_OTHER): Payer: BC Managed Care – PPO | Admitting: Family Medicine

## 2012-01-13 VITALS — BP 103/69 | HR 81 | Ht 61.0 in | Wt 163.0 lb

## 2012-01-13 DIAGNOSIS — R252 Cramp and spasm: Secondary | ICD-10-CM

## 2012-01-13 DIAGNOSIS — G4700 Insomnia, unspecified: Secondary | ICD-10-CM

## 2012-01-13 DIAGNOSIS — E860 Dehydration: Secondary | ICD-10-CM

## 2012-01-13 DIAGNOSIS — E785 Hyperlipidemia, unspecified: Secondary | ICD-10-CM

## 2012-01-13 NOTE — Progress Notes (Signed)
  Subjective:    Patient ID: Rachel Carr, female    DOB: 1950/11/17, 61 y.o.   MRN: [id number]  HPI Not sleeping well since Sat ( 5 days).  Scheduled for surgery since July 17th for her stomach. Wakes up feeling like she is choking, gagging and then will vomit and hard to get back to sleep. 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 She was dehydrated Says started getting muscle cramps in her legs. Needs a note for work safing she is say to work.    Severe GERD - She is back on carafate and reglan.  Says doesn't usually have problems falling asleep. Just wakes up cooughing and gagging with occ vomiting and then can take an hour to finally fall back asleep.  Only caffeine in the AM. Doesn't watch TV to go to bed.    Review of Systems     Objective:   Physical Exam  Constitutional: She is oriented to person, place, and time. She appears well-developed and well-nourished.  HENT:  Head: Normocephalic and atraumatic.  Mouth/Throat: Oropharynx is clear and moist.  Cardiovascular: Normal rate, regular rhythm and normal heart sounds.   Pulmonary/Chest: Effort normal and breath sounds normal.  Abdominal: Soft. Bowel sounds are normal. She exhibits no distension and no mass. There is no tenderness. There is no rebound and no guarding.  Neurological: She is alert and oriented to person, place, and time.  Skin: Skin is warm and dry.  Psychiatric: She has a normal mood and affect. Her behavior is normal.          Assessment & Plan:  [place] - Reviewed sleep hygiene. Avoid any sedatives bc of vomiting in the middle of the night.  No much risk of suffocation and from aspiration.  Recommend trial of melatonin or valerian root. Call if not helping.   Mild dehydration - Work on staying hydrated.  Muscle cramps have resolved.  Check BMP today. Also discussed if starts to feel bad gain should not go to work and risk injury to herself or others. Given a work note to  return tomorrow.   Severe GERD - Surgery scheduled July 17th.   Muscle camps - likley secondary to dehydration. Will check elctrolytes today.   Hyperlipidemia-due to recheck cholesterol. Has been over a year. Lab Results  Component Value Date   CHOL 203* 09/12/2010   HDL 59 09/12/2010   LDLCALC 409* 09/12/2010   TRIG 122 09/12/2010   CHOLHDL 3.4 Ratio 09/12/2010

## 2012-01-13 NOTE — Patient Instructions (Signed)
Melatonin 8 mg and or valerian root capsules to help for sleep.  Insomnia Insomnia is frequent trouble falling and/or staying asleep. Insomnia can be a long term problem or a short term problem. Both are common. Insomnia can be a short term problem when the wakefulness is related to a certain stress or worry. Long term insomnia is often related to ongoing stress during waking hours and/or poor sleeping habits. Overtime, sleep deprivation itself can make the problem worse. Every little thing feels more severe because you are overtired and your ability to cope is decreased. CAUSES    Stress, anxiety, and depression.   Poor sleeping habits.   Distractions such as TV in the bedroom.   Naps close to bedtime.   Engaging in emotionally charged conversations before bed.   Technical reading before sleep.   Alcohol and other sedatives. They may make the problem worse. They can hurt normal sleep patterns and normal dream activity.   Stimulants such as caffeine for several hours prior to bedtime.   Pain syndromes and shortness of breath can cause insomnia.   Exercise late at night.   Changing time zones may cause sleeping problems (jet lag).  It is sometimes helpful to have someone observe your sleeping patterns. They should look for periods of not breathing during the night (sleep apnea). They should also look to see how long those periods last. If you live alone or observers are uncertain, you can also be observed at a sleep clinic where your sleep patterns will be professionally monitored. Sleep apnea requires a checkup and treatment. Give your caregivers your medical history. Give your caregivers observations your family has made about your sleep.   SYMPTOMS    Not feeling rested in the morning.   Anxiety and restlessness at bedtime.   Difficulty falling and staying asleep.  TREATMENT    Your caregiver may prescribe treatment for an underlying medical disorders. Your caregiver can give  advice or help if you are using alcohol or other drugs for self-medication. Treatment of underlying problems will usually eliminate insomnia problems.   Medications can be prescribed for short time use. They are generally not recommended for lengthy use.   Over-the-counter sleep medicines are not recommended for lengthy use. They can be habit forming.   You can promote easier sleeping by making lifestyle changes such as:   Using relaxation techniques that help with breathing and reduce muscle tension.   Exercising earlier in the day.   Changing your diet and the time of your last meal. No night time snacks.   Establish a regular time to go to bed.   Counseling can help with stressful problems and worry.   Soothing music and white noise may be helpful if there are background noises you cannot remove.   Stop tedious detailed work at least one hour before bedtime.  HOME CARE INSTRUCTIONS    Keep a diary. Inform your caregiver about your progress. This includes any medication side effects. See your caregiver regularly. Take note of:   Times when you are asleep.   Times when you are awake during the night.   The quality of your sleep.   How you feel the next day.  This information will help your caregiver care for you.  Get out of bed if you are still awake after 15 minutes. Read or do some quiet activity. Keep the lights down. Wait until you feel sleepy and go back to bed.   Keep regular sleeping and waking hours.  Avoid naps.   Exercise regularly.   Avoid distractions at bedtime. Distractions include watching television or engaging in any intense or detailed activity like attempting to balance the household checkbook.   Develop a bedtime ritual. Keep a familiar routine of bathing, brushing your teeth, climbing into bed at the same time each night, listening to soothing music. Routines increase the success of falling to sleep faster.   Use relaxation techniques. This can be  using breathing and muscle tension release routines. It can also include visualizing peaceful scenes. You can also help control troubling or intruding thoughts by keeping your mind occupied with boring or repetitive thoughts like the old concept of counting sheep. You can make it more creative like imagining planting one beautiful flower after another in your backyard garden.   During your day, work to eliminate stress. When this is not possible use some of the previous suggestions to help reduce the anxiety that accompanies stressful situations.  MAKE SURE YOU:    Understand these instructions.   Will watch your condition.   Will get help right away if you are not doing well or get worse.  Document Released: 07/24/2000 Document Revised: 07/16/2011 Document Reviewed: 08/24/2007 [facility] Patient Information 2012 [place], [place].

## 2012-03-17 DIAGNOSIS — Z9889 Other specified postprocedural states: Secondary | ICD-10-CM | POA: Insufficient documentation

## 2012-06-04 ENCOUNTER — Emergency Department (INDEPENDENT_AMBULATORY_CARE_PROVIDER_SITE_OTHER): Payer: BC Managed Care – PPO

## 2012-06-04 ENCOUNTER — Emergency Department
Admission: EM | Admit: 2012-06-04 | Discharge: 2012-06-04 | Disposition: A | Discharge status: Home / Self Care | Payer: BC Managed Care – PPO | Source: Home / Self Care | Attending: Family Medicine | Admitting: Family Medicine

## 2012-06-04 ENCOUNTER — Encounter: Payer: Self-pay | Admitting: Emergency Medicine

## 2012-06-04 DIAGNOSIS — M7520 Bicipital tendinitis, unspecified shoulder: Secondary | ICD-10-CM

## 2012-06-04 DIAGNOSIS — M898X9 Other specified disorders of bone, unspecified site: Secondary | ICD-10-CM

## 2012-06-04 DIAGNOSIS — M25519 Pain in unspecified shoulder: Secondary | ICD-10-CM

## 2012-06-04 DIAGNOSIS — M7522 Bicipital tendinitis, left shoulder: Secondary | ICD-10-CM

## 2012-06-04 MED ORDER — PREDNISONE 20 MG PO TABS: 20.0000 mg | ORAL_TABLET | Freq: Two times a day (BID) | ORAL | Status: DC

## 2012-06-04 NOTE — ED Provider Notes (Addendum)
History     CSN: [id number]  Arrival date & time 06/04/12  1136   First MD Initiated Contact with Patient 06/04/12 1142      Chief Complaint  Patient presents with  . Shoulder Pain      HPI Comments: Patient complains of onset of left shoulder pain one week ago that has gradually become worse.  She applied heat last night, and has had a significant increase in pain and decreased range of motion.  She recalls no recent or past shoulder injury, and has had no recent change in physical activities.  Patient is a 61 y.o. female presenting with shoulder pain. The history is provided by the patient.  Shoulder Pain This is a new problem. Episode onset: one week ago. The problem occurs constantly. The problem has been gradually worsening. Associated symptoms comments: none. Exacerbated by: moving left shoulder. Nothing relieves the symptoms. She has tried a warm compress for the symptoms. The treatment provided no relief.    Past Medical History  Diagnosis Date  . Esophageal ulcer 09-2010    gastritis Dr. Jason Fila  . Gastric polyps     colonoscopy 2012(due again for repeat 2017)  . Shingles     on the right eye    Past Surgical History  Procedure Date  . Rt knee surgery   . Cholecystectomy   . Abdominal hysterectomy     endometriosis  . Carpal tunnel release     Family History  Problem Relation Age of Onset  . Heart disease Mother   . Diabetes Father   . Cancer Father     prostrate  . Cancer Sister     esophagus  . Diabetes Sister   . Cancer Brother     kidney    History  Substance Use Topics  . Smoking status: Current Every Day Smoker -- 0.5 packs/day for 20 years    Types: Cigarettes  . Smokeless tobacco: Not on file  . Alcohol Use: No    OB History    Grav Para Term Preterm Abortions TAB SAB Ect Mult Living                  Review of Systems  All other systems reviewed and are negative.    Allergies  Review of patient's allergies indicates no known  allergies.  Home Medications   Current Outpatient Rx  Name Route Sig Dispense Refill  . BUPROPION HCL ER (SR) 150 MG PO TB12  TAKE 1 TABLET TWICE A DAY 60 tablet 3  . METOCLOPRAMIDE HCL 10 MG PO TABS Oral Take 1 tablet (10 mg total) by mouth at bedtime. 30 tablet 1  . OMEPRAZOLE 40 MG PO CPDR Oral Take 40 mg by mouth daily.    Marland Kitchen PREDNISONE 20 MG PO TABS Oral Take 1 tablet (20 mg total) by mouth 2 (two) times daily. Take with food. 10 tablet 0  . SUCRALFATE 1 G PO TABS Oral Take 1 g by mouth 4 (four) times daily.      BP 105/70  Pulse 85  Temp 97.4 F (36.3 C) (Oral)  Resp 16  Ht 5\' 1"  (1.549 m)  Wt 158 lb (71.668 kg)  BMI 29.85 kg/m2  SpO2 98%  Physical Exam  Nursing note and vitals reviewed. Constitutional: She is oriented to person, place, and time. She appears well-developed and well-nourished. No distress.  HENT:  Head: Normocephalic and atraumatic.  Eyes: Conjunctivae normal are normal. Pupils are equal, round, and reactive to light.  Neck: Normal range of motion.  Cardiovascular: Normal rate, regular rhythm and normal heart sounds.   Pulmonary/Chest: Effort normal and breath sounds normal.  Musculoskeletal:       Left shoulder: She exhibits decreased range of motion, tenderness, bony tenderness, swelling, pain and decreased strength. She exhibits no effusion, no crepitus, no deformity, no laceration, no spasm and normal pulse.       Arms:      There is tenderness and swelling over the left AC joint.  There is also tenderness over the short and long heads of left biceps tendons.   Patient cannot actively or passively abduct her left arm above horizontal.  Apley test positive.  Empty can positive.  Yergason probably positive.  Lift off test negative.  External rotation strength adequate.  + cross arm test.  Hawkins test positive.  Lymphadenopathy:    She has no cervical adenopathy.  Neurological: She is alert and oriented to person, place, and time.  Skin: Skin is warm and  dry. No rash noted.    ED Course  Procedures  none   Dg Shoulder Left  06/04/2012  *RADIOLOGY REPORT*  Clinical Data: Pain and tenderness  LEFT SHOULDER - 2+ VIEW  Comparison: None.  Findings: Small spurs about the [facility] joint.  Negative for fracture, dislocation, or other acute bony abnormality.  No other significant degenerative change.  Normal mineralization and alignment.  IMPRESSION:  Small AC joint spurs   Original Report Authenticated By: Thora Lance III, M.D.      1. Biceps tendonitis on left   2. Acromioclavicular joint pain; note small AC joint spurs on X-ray       MDM  Rx for prednisone burst. Apply ice pack for 30 to 45 minutes every 1 to 4 hours.  Continue until swelling decreases.  Begin pendulum exercises.  May take Hydrococone/acetaminophen at bedtime for pain.  After finishing prednisone, may switch to Ibuprofen 200mg , 4 tabs every 8 hours with food        Lattie Haw, MD 06/04/12 1311  Lattie Haw, MD 06/04/12 [id number]

## 2012-06-04 NOTE — ED Notes (Signed)
Patient has had left shoulder pain for about 1 week; woke up this a.m.with exacerbation of same. States pain mostly in shoulder and is sharp, especially upon any movement.

## 2012-06-22 ENCOUNTER — Encounter: Payer: Self-pay | Admitting: Family Medicine

## 2012-08-31 ENCOUNTER — Telehealth: Payer: Self-pay | Admitting: Family Medicine

## 2012-08-31 ENCOUNTER — Encounter: Payer: Self-pay | Admitting: *Deleted

## 2012-08-31 ENCOUNTER — Emergency Department (INDEPENDENT_AMBULATORY_CARE_PROVIDER_SITE_OTHER): Payer: BC Managed Care – PPO

## 2012-08-31 ENCOUNTER — Emergency Department
Admission: EM | Admit: 2012-08-31 | Discharge: 2012-08-31 | Disposition: A | Discharge status: Home / Self Care | Payer: BC Managed Care – PPO | Source: Home / Self Care | Attending: Family Medicine | Admitting: Family Medicine

## 2012-08-31 DIAGNOSIS — R059 Cough, unspecified: Secondary | ICD-10-CM

## 2012-08-31 DIAGNOSIS — J40 Bronchitis, not specified as acute or chronic: Secondary | ICD-10-CM

## 2012-08-31 DIAGNOSIS — R05 Cough: Secondary | ICD-10-CM

## 2012-08-31 DIAGNOSIS — J441 Chronic obstructive pulmonary disease with (acute) exacerbation: Secondary | ICD-10-CM

## 2012-08-31 DIAGNOSIS — J069 Acute upper respiratory infection, unspecified: Secondary | ICD-10-CM

## 2012-08-31 DIAGNOSIS — B001 Herpesviral vesicular dermatitis: Secondary | ICD-10-CM

## 2012-08-31 DIAGNOSIS — R0989 Other specified symptoms and signs involving the circulatory and respiratory systems: Secondary | ICD-10-CM

## 2012-08-31 HISTORY — DX: Barrett's esophagus without dysplasia: K22.70

## 2012-08-31 MED ORDER — PREDNISONE 50 MG PO TABS: ORAL_TABLET | ORAL | Status: DC

## 2012-08-31 MED ORDER — METHYLPREDNISOLONE SODIUM SUCC 125 MG IJ SOLR
125.0000 mg | Freq: Once | INTRAMUSCULAR | Status: AC
  Administered 2012-08-31: 125 mg via INTRAMUSCULAR

## 2012-08-31 MED ORDER — VALACYCLOVIR HCL 1 G PO TABS: 1000.0000 mg | ORAL_TABLET | Freq: Two times a day (BID) | ORAL | Status: DC

## 2012-08-31 MED ORDER — ALBUTEROL SULFATE HFA 108 (90 BASE) MCG/ACT IN AERS: 2.0000 | INHALATION_SPRAY | RESPIRATORY_TRACT | Status: DC | PRN

## 2012-08-31 MED ORDER — DOXYCYCLINE HYCLATE 100 MG PO CAPS: 100.0000 mg | ORAL_CAPSULE | Freq: Two times a day (BID) | ORAL | Status: AC

## 2012-08-31 NOTE — ED Provider Notes (Signed)
History     CSN: [id number]  Arrival date & time 08/31/12  1553   First MD Initiated Contact with Patient 08/31/12 1611      Chief Complaint  Patient presents with  . Cough   HPI  URI Symptoms Onset: 1 week  Description: rhinorrhea, nasal congestion, cough, wheezing  Modifying factors:  30+ pack year smoking history. Quit 6 months ago. Also with hx/o PNA that required admission in the past.   Symptoms Nasal discharge: yes Fever: no Sore throat: no Cough: yes Wheezing: yes Ear pain: no GI symptoms: no Sick contacts: yes  Red Flags  Stiff neck:  Dyspnea: mild Rash: no Swallowing difficulty: no  Sinusitis Risk Factors Headache/face pain: no Double sickening: no tooth pain: no  Allergy Risk Factors Sneezing: no Itchy scratchy throat: no Seasonal symptoms: no  Flu Risk Factors Headache: no muscle aches: no severe fatigue: no  Pt also reports fever blister formation over R nares over past 4-5 days. Has happened in the past. No purulent drainage.     Past Medical History  Diagnosis Date  . Esophageal ulcer 09-2010    gastritis Dr. Jason Fila  . Gastric polyps     colonoscopy 2012(due again for repeat 2017)  . Shingles     on the right eye  . Barrett's syndrome     Past Surgical History  Procedure Date  . Rt knee surgery   . Cholecystectomy   . Abdominal hysterectomy     endometriosis  . Carpal tunnel release   . Radiofrequency ablation     Family History  Problem Relation Age of Onset  . Heart disease Mother   . Diabetes Father   . Cancer Father     prostrate  . Cancer Sister     esophagus  . Diabetes Sister   . Cancer Brother     kidney    History  Substance Use Topics  . Smoking status: Current Every Day Smoker -- 0.5 packs/day for 20 years    Types: Cigarettes  . Smokeless tobacco: Not on file  . Alcohol Use: No    OB History    Grav Para Term Preterm Abortions TAB SAB Ect Mult Living                  Review of Systems  All  other systems reviewed and are negative.    Allergies  Review of patient's allergies indicates no known allergies.  Home Medications   Current Outpatient Rx  Name  Route  Sig  Dispense  Refill  . METOCLOPRAMIDE HCL 10 MG PO TABS   Oral   Take 1 tablet (10 mg total) by mouth at bedtime.   30 tablet   1   . OMEPRAZOLE 40 MG PO CPDR   Oral   Take 40 mg by mouth daily.         Marland Kitchen PREDNISONE 20 MG PO TABS   Oral   Take 1 tablet (20 mg total) by mouth 2 (two) times daily. Take with food.   10 tablet   0   . SUCRALFATE 1 G PO TABS   Oral   Take 1 g by mouth 4 (four) times daily.           BP 116/75  Pulse 82  Temp 98.4 F (36.9 C) (Oral)  Resp 16  Ht 5\' 1"  (1.549 m)  Wt 163 lb (73.936 kg)  BMI 30.80 kg/m2  SpO2 97%  Physical Exam  Constitutional: She appears  well-developed and well-nourished.  HENT:  Head: Normocephalic and atraumatic.  Right Ear: External ear normal.  Left Ear: External ear normal.  Nose:         +nasal erythema, rhinorrhea bilaterally, + post oropharyngeal erythema   0.5-1 cm erythematous vesicular lesion on r nares consistent with fever blister     Eyes: Conjunctivae normal are normal. Pupils are equal, round, and reactive to light.  Neck: Normal range of motion. Neck supple.  Cardiovascular: Normal rate, regular rhythm and normal heart sounds.   Pulmonary/Chest: Effort normal.       Diffuses wheezes and course breath sounds    Lymphadenopathy:    She has no cervical adenopathy.    ED Course  Procedures (including critical care time)  Labs Reviewed - No data to display Dg Chest 2 View  08/31/2012  *RADIOLOGY REPORT*  Clinical Data: Cough and congestion.  CHEST - 2 VIEW  Comparison: None.  Findings: There is peribronchial thickening but the lungs are clear.  Heart size is normal.  No pneumothorax or pleural fluid.  IMPRESSION: Bronchitic change without focal process.   Original Report Authenticated By: Holley Dexter, M.D.       1. URI (upper respiratory infection)   2. COPD exacerbation   3. Fever blister   4. Bronchitis       MDM  Solumedrol 125mg  IMx1 Prednisone x 5 days. Doxycycline.  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  Plan for follow up with PCP in 2-3 days for general reevaluation.  Resp status is overall stable currently.  Discussed infectious and resp red flags at length.     The patient and/or caregiver has been counseled thoroughly with regard to treatment plan and/or medications prescribed including dosage, schedule, interactions, rationale for use, and possible side effects and they verbalize understanding. Diagnoses and expected course of recovery discussed and will return if not improved as expected or if the condition worsens. Patient and/or caregiver verbalized understanding.             Doree Albee, MD 08/31/12 1807

## 2012-08-31 NOTE — Telephone Encounter (Signed)
Call pt: Recommend spirometry to evaluate her lungs in 2-3 weeks when she is feeling better. There was some signs of thickened bronchial tubes so want to make sure breathing function is normal.

## 2012-08-31 NOTE — ED Notes (Signed)
Patient c/o cough and congestion x 1 week. ? Fever blister developed in and around nose yesterday. Denies fever. Taken Robitussin.

## 2012-09-01 NOTE — Telephone Encounter (Signed)
Pt.notified

## 2012-10-25 ENCOUNTER — Encounter: Payer: Self-pay | Admitting: Family Medicine

## 2012-10-25 ENCOUNTER — Telehealth: Payer: Self-pay | Admitting: *Deleted

## 2012-10-25 ENCOUNTER — Telehealth: Payer: Self-pay | Admitting: Family Medicine

## 2012-10-25 NOTE — Telephone Encounter (Signed)
Please call patient if she's willing to schedule her mammogram. I do not see an up-to-date with in her report. He we can certainly schedule her downstairs at that continued for her.

## 2012-10-25 NOTE — Telephone Encounter (Signed)
Imaging spoke with pt & pt does not want to schedule a mammo.

## 2012-10-25 NOTE — Telephone Encounter (Signed)
Imaging dept is scheduling her mammo appt.

## 2013-10-16 ENCOUNTER — Encounter: Payer: Self-pay | Admitting: Family Medicine

## 2013-10-16 ENCOUNTER — Ambulatory Visit (INDEPENDENT_AMBULATORY_CARE_PROVIDER_SITE_OTHER): Payer: BC Managed Care – PPO | Admitting: Family Medicine

## 2013-10-16 VITALS — BP 114/75 | HR 68 | Temp 97.4°F | Wt 174.0 lb

## 2013-10-16 DIAGNOSIS — B9689 Other specified bacterial agents as the cause of diseases classified elsewhere: Secondary | ICD-10-CM

## 2013-10-16 DIAGNOSIS — H6122 Impacted cerumen, left ear: Secondary | ICD-10-CM

## 2013-10-16 DIAGNOSIS — J329 Chronic sinusitis, unspecified: Secondary | ICD-10-CM

## 2013-10-16 DIAGNOSIS — H6120 Impacted cerumen, unspecified ear: Secondary | ICD-10-CM

## 2013-10-16 DIAGNOSIS — A499 Bacterial infection, unspecified: Secondary | ICD-10-CM

## 2013-10-16 MED ORDER — AMOXICILLIN-POT CLAVULANATE 500-125 MG PO TABS: ORAL_TABLET | ORAL | Status: AC

## 2013-10-16 NOTE — Progress Notes (Signed)
CC: Rachel Carr is a 63 y.o. female is here for Headache and Otalgia   Subjective: HPI:  Complains of left ear pain has been present for the past week worsening on a daily basis. Accompanied by fevers and chills over the weekend subjectively no objective measurement. Symptoms are worsened with leaning forward or looking down. Pain radiates from just beneath the left eye into the ear continues to the back of her head on the left. Interventions have included sweet oil placed in the ear without any benefit. She reports muffled hearing on the left and mild dizziness but no other motor or sensory disturbances. Symptoms overall are moderate in severity present all hours of the day. Denies confusion, weakness, rash, chest pain, shortness of breath or drainage from either ear. Has been accompanied by nasal congestion since onset.   Review Of Systems Outlined In HPI  Past Medical History  Diagnosis Date  . Esophageal ulcer 09-2010    gastritis Dr. Glennon Hamilton  . Gastric polyps     colonoscopy 2012(due again for repeat 2017)  . Shingles     on the right eye  . Barrett's syndrome   . Esophageal stricture     Past Surgical History  Procedure Laterality Date  . Rt knee surgery    . Cholecystectomy    . Abdominal hysterectomy      endometriosis  . Carpal tunnel release    . Radiofrequency ablation     Family History  Problem Relation Age of Onset  . Heart disease Mother   . Diabetes Father   . Cancer Father     prostrate  . Cancer Sister     esophagus  . Diabetes Sister   . Cancer Brother     kidney    History   Social History  . Marital Status: Married    Spouse Name: N/A    Number of Children: N/A  . Years of Education: N/A   Occupational History  . Not on file.   Social History Main Topics  . Smoking status: Current Every Day Smoker -- 0.50 packs/day for 20 years    Types: Cigarettes  . Smokeless tobacco: Not on file  . Alcohol Use: No  . Drug Use: No  . 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   Other Topics Concern  . Not on file   Social History Narrative  . No narrative on file     Objective: BP 114/75  Pulse 68  Temp(Src) 97.4 F (36.3 C) (Oral)  Wt 174 lb (78.926 kg)  General: Alert and Oriented, No Acute Distress HEENT: Pupils equal, round, reactive to light. Conjunctivae clear.  External ears unremarkable, on initial exam left canal is obscured by a cerumen impaction. After successful removal of impaction  left canal is unremarkable.canals clear with intact TMs with appropriate landmarks.  Middle ear appears open without effusion. Pink inferior turbinates.  Moist mucous membranes, pharynx without inflammation nor lesions.  Neck supple without palpable lymphadenopathy nor abnormal masses. No mastoid tenderness to percussion Lungs: Clear to auscultation bilaterally, no wheezing/ronchi/rales.  Comfortable work of breathing. Good air movement. Neuro: Cranial nerves II through XII grossly intact Extremities: No peripheral edema.  Strong peripheral pulses.  Mental Status: No depression, anxiety, nor agitation. Skin: Warm and dry.  Assessment & Plan: Ia was seen today for headache and otalgia.  Diagnoses and associated orders for this visit:  Bacterial sinusitis - amoxicillin-clavulanate (AUGMENTIN) 500-125 MG per tablet;  Take one by mouth every 8 hours for ten total days.  Impacted cerumen of left ear    Bacterial sinusitis: Start Augmentin with as needed Alka-Seltzer cold and sinus and nasal saline washes. If pain, dizziness, or any other neurologic symptoms persist by Wednesday call for consideration of neuroimaging.  Indication: Cerumen impaction of the ear(s) Medical necessity statement: On physical examination, cerumen impairs clinically significant portions of the external auditory canal, and tympanic membrane. Noted obstructive, copious cerumen that cannot be removed without  magnification and instrumentations requiring physician skills Consent: Discussed benefits and risks of procedure and verbal consent obtained Procedure: Patient was prepped for the procedure. Utilized an otoscope to assess and take note of the ear canal, the tympanic membrane, and the presence, amount, and placement of the cerumen. Gentle water irrigation and soft plastic curette was utilized to remove cerumen.  Post procedure examination: shows cerumen was completely removed. Patient tolerated procedure well. The patient is made aware that they may experience temporary vertigo, temporary hearing loss, and temporary discomfort. If these symptom last for more than 24 hours to call the clinic or proceed to the ED.    Return if symptoms worsen or fail to improve.

## 2013-12-20 ENCOUNTER — Emergency Department
Admission: EM | Admit: 2013-12-20 | Discharge: 2013-12-20 | Disposition: A | Discharge status: Home / Self Care | Payer: BC Managed Care – PPO | Source: Home / Self Care | Attending: Family Medicine | Admitting: Family Medicine

## 2013-12-20 ENCOUNTER — Encounter: Payer: Self-pay | Admitting: Emergency Medicine

## 2013-12-20 DIAGNOSIS — H5710 Ocular pain, unspecified eye: Secondary | ICD-10-CM

## 2013-12-20 DIAGNOSIS — H5712 Ocular pain, left eye: Secondary | ICD-10-CM

## 2013-12-20 MED ORDER — PREDNISOLONE ACETATE 1 % OP SUSP: 1.0000 [drp] | Freq: Four times a day (QID) | OPHTHALMIC | Status: DC

## 2013-12-20 MED ORDER — VALACYCLOVIR HCL 1 G PO TABS: 1000.0000 mg | ORAL_TABLET | Freq: Three times a day (TID) | ORAL | Status: DC

## 2013-12-20 NOTE — ED Notes (Signed)
Pt c/o RT eye pain, burning and swelling x this AM. Denies fever or injury. She reports a hx of shingles in her RT eye 2 years ago. No OTC meds.

## 2013-12-20 NOTE — ED Provider Notes (Signed)
CSN: [id number]     Arrival date & time 12/20/13  1404 History   First MD Initiated Contact with Patient 12/20/13 1420     Chief Complaint  Patient presents with  . Eye Pain      HPI Comments: Patient noticed some mild irritation in her right eye yesterday evening, but no foreign body sensation.  This morning she awoke with distinct burning right eye pain and sensitivity to light.  She noticed a burning sensation in the area beneath her right eye.  No known foreign body to right eye.  No injury.  No fevers, chills, and sweats.  She feels well otherwise. She states that she had herpes zoster ophthalmicus last year and the initial symptoms at that time were exactly the same.  At that time she had a rash beneath her right eye.  Her symptoms last year resolved with Valtrex.  Patient is a 63 y.o. female presenting with eye pain. The history is provided by the patient.  Eye Pain This is a recurrent problem. The current episode started 6 to 12 hours ago. The problem occurs constantly. The problem has been gradually worsening. Associated symptoms comments: none. Exacerbated by: light. Nothing relieves the symptoms. She has tried nothing for the symptoms.    Past Medical History  Diagnosis Date  . Esophageal ulcer 09-2010    gastritis Dr. Glennon Hamilton  . Gastric polyps     colonoscopy 2012(due again for repeat 2017)  . Shingles     on the right eye  . Barrett's syndrome   . Esophageal stricture    Past Surgical History  Procedure Laterality Date  . Rt knee surgery    . Cholecystectomy    . Abdominal hysterectomy      endometriosis  . Carpal tunnel release    . Radiofrequency ablation     Family History  Problem Relation Age of Onset  . Heart disease Mother   . Diabetes Father   . Cancer Father     prostrate  . Cancer Sister     esophagus  . Diabetes Sister   . Cancer Brother     kidney   History  Substance Use Topics  . Smoking status: Current Every Day Smoker -- 0.50 packs/day for 20  years    Types: Cigarettes  . Smokeless tobacco: Not on file  . Alcohol Use: No   OB History   Grav Para Term Preterm Abortions TAB SAB Ect Mult Living                 Review of Systems  Eyes: Positive for pain.  All other systems reviewed and are negative.   Allergies  Review of patient's allergies indicates no known allergies.  Home Medications   Prior to Admission medications   Medication Sig Start Date End Date Taking? Authorizing Provider  albuterol (PROVENTIL HFA;VENTOLIN HFA) 108 (90 BASE) MCG/ACT inhaler Inhale 2 puffs into the lungs every 4 (four) hours as needed for wheezing (cough, shortness of breath or wheezing.). 08/31/12   Shanda Howells, MD  metoCLOPramide (REGLAN) 10 MG tablet Take 1 tablet (10 mg total) by mouth at bedtime. 11/24/11 11/23/12  Frederich Cha, MD  omeprazole (PRILOSEC) 40 MG capsule Take 40 mg by mouth daily.    Historical Provider, MD  prednisoLONE acetate (PRED FORTE) 1 % ophthalmic suspension Place 1 drop into the right eye 4 (four) times daily. 12/20/13   Kandra Nicolas, MD  sucralfate (CARAFATE) 1 G tablet Take 1 g by mouth  4 (four) times daily.    Historical Provider, MD  valACYclovir (VALTREX) 1000 MG tablet Take 1 tablet (1,000 mg total) by mouth 3 (three) times daily. 12/20/13   Kandra Nicolas, MD   BP 123/78  Pulse 78  Temp(Src) 97.7 F (36.5 C) (Oral)  Resp 18  Ht 5\' 1"  (1.549 m)  Wt 171 lb (77.565 kg)  BMI 32.33 kg/m2  SpO2 99% Physical Exam Nursing notes and Vital Signs reviewed. Appearance:  Patient appears healthy, stated age, and in no acute distress Eyes:  Pupils are equal, round, and reactive to light and accomodation.  Extraocular movement is intact.  Conjunctivae are not inflamed.  Patient has distinct photophobia, not completely resolved after instillation of tetracaine ophth susp.  Fundi difficult to adequately visualize.  No lid swelling, erythema or tenderness.  Right lid eversion reveals no foreign bodies.  No fluorescein  uptake in right eye.    Skin:  No rash present.   ED Course  Procedures  none          MDM   1. 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     Begin Valtrex, and Prednisolone ophth suspension. May take Tylenol as needed for pain. Followup with ophthalmologist tomorrow.   Kandra Nicolas, MD 12/20/13 [phone]

## 2013-12-20 NOTE — Discharge Instructions (Signed)
May take Tylenol as needed for pain. 

## 2014-04-10 ENCOUNTER — Encounter: Payer: Self-pay | Admitting: Family Medicine

## 2014-08-22 ENCOUNTER — Emergency Department (INDEPENDENT_AMBULATORY_CARE_PROVIDER_SITE_OTHER)
Admission: EM | Admit: 2014-08-22 | Discharge: 2014-08-22 | Disposition: A | Discharge status: Home / Self Care | Payer: BLUE CROSS/BLUE SHIELD | Source: Home / Self Care | Attending: Family Medicine | Admitting: Family Medicine

## 2014-08-22 ENCOUNTER — Encounter: Payer: Self-pay | Admitting: *Deleted

## 2014-08-22 DIAGNOSIS — J209 Acute bronchitis, unspecified: Secondary | ICD-10-CM

## 2014-08-22 MED ORDER — DOXYCYCLINE HYCLATE 100 MG PO CAPS: 100.0000 mg | ORAL_CAPSULE | Freq: Two times a day (BID) | ORAL | Status: DC

## 2014-08-22 MED ORDER — BENZONATATE 200 MG PO CAPS: 200.0000 mg | ORAL_CAPSULE | Freq: Every day | ORAL | Status: DC

## 2014-08-22 NOTE — ED Notes (Signed)
Aveena c/o non-productive cough, congestion, sinus pain and aches x 1 week. Denies fever.

## 2014-08-22 NOTE — Discharge Instructions (Signed)
Take plain Mucinex (1200 mg guaifenesin) twice daily for cough and congestion.  May add Pseudoephedrine for sinus congestion.   Increase fluid intake, rest. May use Afrin nasal spray (or generic oxymetazoline) twice daily for about 5 days.  Also recommend using saline nasal spray several times daily and saline nasal irrigation; continue using Neti pot. Try warm salt water gargles for sore throat.  Stop all antihistamines for now, and other non-prescription cough/cold preparations. Follow-up with family doctor for smoking cessation.  Follow-up with family doctor if not improving about10 days.

## 2014-08-22 NOTE — ED Provider Notes (Signed)
CSN: [id number]     Arrival date & time 08/22/14  1234 History   First MD Initiated Contact with Patient 08/22/14 1325     Chief Complaint  Patient presents with  . Cough  . Nasal Congestion      HPI Comments: Patient complains of ten day history of typical cold-like symptoms including myalgias, sinus congestion, headache, fatigue, and cough (but minimal sore throat).  The cough has persisted and she now has intermittent wheezing. She continues to smoke and admits that she would like to quit.   The history is provided by the patient.    Past Medical History  Diagnosis Date  . Esophageal ulcer 09-2010    gastritis Dr. Glennon Hamilton  . Gastric polyps     colonoscopy 2012(due again for repeat 2017)  . Shingles     on the right eye  . Barrett's syndrome   . Esophageal stricture    Past Surgical History  Procedure Laterality Date  . Rt knee surgery    . Cholecystectomy    . Abdominal hysterectomy      endometriosis  . Carpal tunnel release    . Radiofrequency ablation     Family History  Problem Relation Age of Onset  . Heart disease Mother   . Diabetes Father   . Cancer Father     prostrate  . Cancer Sister     esophagus  . Diabetes Sister   . Cancer Brother     kidney   History  Substance Use Topics  . Smoking status: Current Every Day Smoker -- 0.50 packs/day for 20 years    Types: Cigarettes  . Smokeless tobacco: Never Used  . Alcohol Use: No   OB History    No data available     Review of Systems No sore throat + hoarseness + cough No pleuritic pain + wheezing + nasal congestion + post-nasal drainage No sinus pain/pressure No itchy/red eyes Noearache No hemoptysis No SOB No fever/chills No nausea No vomiting No abdominal pain No diarrhea No urinary symptoms No skin rash + fatigue + myalgias + headache Used OTC meds without relief  Allergies  Review of patient's allergies indicates no known allergies.  Home Medications   Prior to Admission  medications   Medication Sig Start Date End Date Taking? Authorizing Provider  omeprazole (PRILOSEC) 40 MG capsule Take 40 mg by mouth daily.   Yes Historical Provider, MD  benzonatate (TESSALON) 200 MG capsule Take 1 capsule (200 mg total) by mouth at bedtime. Take as needed for cough 08/22/14   Kandra Nicolas, MD  doxycycline (VIBRAMYCIN) 100 MG capsule Take 1 capsule (100 mg total) by mouth 2 (two) times daily. Take with food. 08/22/14   Kandra Nicolas, MD  sucralfate (CARAFATE) 1 G tablet Take 1 g by mouth 4 (four) times daily.    Historical Provider, MD  valACYclovir (VALTREX) 1000 MG tablet Take 1 tablet (1,000 mg total) by mouth 3 (three) times daily. 12/20/13   Kandra Nicolas, MD   BP 113/74 mmHg  Pulse 79  Temp(Src) 98.1 F (36.7 C) (Oral)  Resp 16  Wt 180 lb (81.647 kg)  SpO2 97% Physical Exam Nursing notes and Vital Signs reviewed. Appearance:  Patient appears stated age, and in no acute distress Eyes:  Pupils are equal, round, and reactive to light and accomodation.  Extraocular movement is intact.  Conjunctivae are not inflamed  Ears:  Canals normal.  Tympanic membranes normal.  Nose:  Mildly congested turbinates.  No sinus tenderness.    Pharynx:  Normal Neck:  Supple.  Tender enlarged posterior nodes are palpated bilaterally  Lungs:  Clear to auscultation.  Breath sounds are equal.  Heart:  Regular rate and rhythm without murmurs, rubs, or gallops.  Abdomen:  Nontender without masses or hepatosplenomegaly.  Bowel sounds are present.  No CVA or flank tenderness.  Extremities:  No edema.  No calf tenderness Skin:  No rash present.   ED Course  Procedures  none     MDM   1. Acute bronchitis, unspecified organism    Begin doxycycline 100mg  BID.  Prescription written for Benzonatate [facility]) to take at bedtime for night-time cough.  Take plain Mucinex (1200 mg guaifenesin) twice daily for cough and congestion.  May add Pseudoephedrine for sinus congestion.   Increase  fluid intake, rest. May use Afrin nasal spray (or generic oxymetazoline) twice daily for about 5 days.  Also recommend using saline nasal spray several times daily and saline nasal irrigation; continue using Neti pot. Try warm salt water gargles for sore throat.  Stop all antihistamines for now, and other non-prescription cough/cold preparations. Follow-up with family doctor for smoking cessation.  Follow-up with family doctor if not improving about10 days.     Kandra Nicolas, MD 08/27/14 [phone]

## 2014-08-24 ENCOUNTER — Ambulatory Visit: Payer: BLUE CROSS/BLUE SHIELD | Admitting: Family Medicine

## 2014-08-30 ENCOUNTER — Ambulatory Visit (INDEPENDENT_AMBULATORY_CARE_PROVIDER_SITE_OTHER): Payer: BLUE CROSS/BLUE SHIELD | Admitting: Family Medicine

## 2014-08-30 ENCOUNTER — Encounter: Payer: Self-pay | Admitting: Family Medicine

## 2014-08-30 VITALS — BP 89/57 | HR 108 | Ht 61.0 in | Wt 181.0 lb

## 2014-08-30 DIAGNOSIS — Z720 Tobacco use: Secondary | ICD-10-CM

## 2014-08-30 DIAGNOSIS — F17200 Nicotine dependence, unspecified, uncomplicated: Secondary | ICD-10-CM

## 2014-08-30 DIAGNOSIS — K5900 Constipation, unspecified: Secondary | ICD-10-CM | POA: Diagnosis not present

## 2014-08-30 DIAGNOSIS — K5909 Other constipation: Secondary | ICD-10-CM

## 2014-08-30 MED ORDER — VARENICLINE TARTRATE 0.5 MG X 11 & 1 MG X 42 PO MISC: ORAL | Status: DC

## 2014-08-30 NOTE — Patient Instructions (Signed)
Constipation  Constipation is when a person has fewer than three bowel movements a week, has difficulty having a bowel movement, or has stools that are dry, hard, or larger than normal. As people grow older, constipation is more common. If you try to fix constipation with medicines that make you have a bowel movement (laxatives), the problem may get worse. Long-term laxative use may cause the muscles of the colon to become weak. A low-fiber diet, not taking in enough fluids, and taking certain medicines may make constipation worse.   CAUSES   · Certain medicines, such as antidepressants, pain medicine, iron supplements, antacids, and water pills.    · Certain diseases, such as diabetes, irritable bowel syndrome (IBS), thyroid disease, or depression.    · Not drinking enough water.    · Not eating enough fiber-rich foods.    · Stress or travel.    · Lack of physical activity or exercise.    · Ignoring the urge to have a bowel movement.    · Using laxatives too much.    SIGNS AND SYMPTOMS   · Having fewer than three bowel movements a week.    · Straining to have a bowel movement.    · Having stools that are hard, dry, or larger than normal.    · Feeling full or bloated.    · Pain in the lower abdomen.    · Not feeling relief after having a bowel movement.    DIAGNOSIS   Your health care provider will take a medical history and perform a physical exam. Further testing may be done for severe constipation. Some tests may include:  · A barium enema X-ray to examine your rectum, colon, and, sometimes, your small intestine.    · A sigmoidoscopy to examine your lower colon.    · A colonoscopy to examine your entire colon.  TREATMENT   Treatment will depend on the severity of your constipation and what is causing it. Some dietary treatments include drinking more fluids and eating more fiber-rich foods. Lifestyle treatments may include regular exercise. If these diet and lifestyle recommendations do not help, your health care  provider may recommend taking over-the-counter laxative medicines to help you have bowel movements. Prescription medicines may be prescribed if over-the-counter medicines do not work.   HOME CARE INSTRUCTIONS   · Eat foods that have a lot of fiber, such as fruits, vegetables, whole grains, and beans.  · Limit foods high in fat and processed sugars, such as french fries, hamburgers, cookies, candies, and soda.    · A fiber supplement may be added to your diet if you cannot get enough fiber from foods.    · Drink enough fluids to keep your urine clear or pale yellow.    · Exercise regularly or as directed by your health care provider.    · Go to the restroom when you have the urge to go. Do not hold it.    · Only take over-the-counter or prescription medicines as directed by your health care provider. Do not take other medicines for constipation without talking to your health care provider first.    SEEK IMMEDIATE MEDICAL CARE IF:   · You have bright red blood in your stool.    · Your constipation lasts for more than 4 days or gets worse.    · You have abdominal or rectal pain.    · You have thin, pencil-like stools.    · You have unexplained weight loss.  MAKE SURE YOU:   · Understand these instructions.  · Will watch your condition.  · Will get help right away if you are not   you have with your health care provider. Magnesium Hydroxide oral suspension What is this medicine? MAGNESIUM HYDROXIDE (mag NEE zhum hye DROX ide) is a laxative. It is used to treat constipation. This medicine may be used for other purposes; ask your health care provider or pharmacist if you have questions. COMMON BRAND NAME(S): Ex-Lax, Hardin Negus  Milk of Magnesia What should I tell my health care provider before I take this medicine? They need to know if you have any of these conditions: -bowel, intestinal, or stomach disease -change in bowel habits for more than 14 days -kidney disease -low magnesium diet -nausea, vomiting -stomach pain or blockage -an unusual or allergic reaction to magnesium hydroxide, other medicines, foods, dyes, or preservatives -pregnant or trying to get pregnant -breast-feeding How should I use this medicine? Take this medicine by mouth. Follow the directions on the package or prescription label. Shake well before using. Use a specially marked spoon or dropper to measure each dose. Ask your pharmacist if you do not have one. Household spoons are not accurate. After taking this medicine, drink a full glass of water. Take your medicine at regular intervals. Do not take your medicine more often than directed. Talk to your pediatrician regarding the use of this medicine in children. While this medicine may be used in children for selected conditions, precautions do apply. Overdosage: If you think you have taken too much of this medicine contact a poison control center or emergency room at once. NOTE: This medicine is only for you. Do not share this medicine with others. What if I miss a dose? If you miss a dose, take it as soon as you can. If it is almost time for your next dose, take only that dose. Do not take double or extra doses. What may interact with this medicine? -antibiotics -delavirdine -gabapentin -lactulose -medicines for fungal infections like itraconazole and ketoconazole -medicines for osteoporosis like alendronate, etidronate, risedronate and tiludronate -medicines for seizures like ethotoin and phenytoin -methenamine -other magnesium-containing antacids, laxatives or supplements -phenothiazines like chlorpromazine, mesoridazine, prochlorperazine,  thioridazine -quinidine -rosuvastatin -sodium polystyrene sulfonate -sotalol -vitamin D This list may not describe all possible interactions. Give your health care provider a list of all the medicines, herbs, non-prescription drugs, or dietary supplements you use. Also tell them if you smoke, drink alcohol, or use illegal drugs. Some items may interact with your medicine. What should I watch for while using this medicine? Tell your doctor or healthcare professional if your symptoms do not start to get better or if they get worse. Do not treat yourself for constipation with this medicine for more than 1 week. See a doctor if you have black tarry stools, rectal bleeding, or if you feel unusually tired. Do not change to another laxative product without advice. If you are taking other medicines, leave an interval of at least 2 hours before or after taking this medicine. To help reduce constipation, drink several glasses of water a day. What side effects may I notice from receiving this medicine? Side effects that you should report to your doctor or health care professional as soon as possible: -allergic reactions like skin rash, itching or hives, swelling of the face, lips, or tongue -confusion -feeling faint or lightheaded, falls -loss of appetite -nausea, vomiting -rectal bleeding -unusually weak or tired Side effects that usually do not require medical attention (report to your doctor or health care professional if they continue or are bothersome): -chalky taste -diarrhea -stomach cramps This list may not describe all possible  side effects. Call your doctor for medical advice about side effects. You may report side effects to FDA at 1-800-FDA-1088. Where should I keep my medicine? Keep out of the reach of children. Store at room temperature between 15 and 30 degrees C (59 and 86 degrees F). Do not freeze. Protect from light and moisture. Throw away any unused medicine after the expiration  date. NOTE: This sheet is a summary. It may not cover all possible information. If you have questions about this medicine, talk to your doctor, pharmacist, or health care provider.  2015, [employer]. (2008-02-13 14:54:13)

## 2014-08-30 NOTE — Progress Notes (Signed)
   Subjective:    Patient ID: Rachel Carr, female    DOB: 08-Aug-1951, 64 y.o.   MRN: [id number]  HPI She is wanting to quit smoking. She is smoking about 2 packs per week.  Took Chantix aobut 10 years ago and started it but couldn't afford to continue it. She did feel it help. She said the point where she really wants to quit smoking.  Constipation - pt reports that her bm's are irregular and she has to take a laxative before they move. She says the stools are soft, they are not hard but they just not move on her own. she is now using prunelax. she has tried miralax before and didn't work. she does drink plenty of water. She denies any blood in the stool. She does have hemorrhoids and says they occasionally bleed.   Review of Systems     Objective:   Physical Exam  Constitutional: She is oriented to person, place, and time. She appears well-developed and well-nourished.  HENT:  Head: Normocephalic and atraumatic.  Cardiovascular: Normal rate, regular rhythm and normal heart sounds.   Pulmonary/Chest: Effort normal and breath sounds normal.  Abdominal: Soft. Bowel sounds are normal. She exhibits no distension and no mass. There is no tenderness. There is no rebound and no guarding.  Neurological: She is alert and oriented to person, place, and time.  Skin: Skin is warm and dry.  Psychiatric: She has a normal mood and affect. Her behavior is normal.          Assessment & Plan:  Tobacco abuse-is fantastic as she's her to cut back down to 2 packs per day. Will restart Chantix. She'll try to 10 years ago and did well with it. I reminded her about the potential side effects and call she hasn't palms or concerns. Prescription given today as well as coupon card provided. Next  Constipation-, chronic-discussed different options to help move her bowels. Encouraged her to increase fiber and drink plenty of water. We also discussed that there are 2 prescription medications on the market for  chronic constipation that could be used. We could also try getting her bowels moving with the stimuli laxative and then switch back to MiraLAX on a daily basis. She was worried and did not want to take it more than 4 days in a row. I let her know that it's safe to use on a regular basis. And if this is still not helping the consider one of the prescription options. Abdomen was soft and nontender on exam today.

## 2014-09-29 ENCOUNTER — Emergency Department
Admission: EM | Admit: 2014-09-29 | Discharge: 2014-09-29 | Disposition: A | Discharge status: Home / Self Care | Payer: BLUE CROSS/BLUE SHIELD | Source: Home / Self Care | Attending: Family Medicine | Admitting: Family Medicine

## 2014-09-29 ENCOUNTER — Encounter: Payer: Self-pay | Admitting: Emergency Medicine

## 2014-09-29 DIAGNOSIS — B0230 Zoster ocular disease, unspecified: Secondary | ICD-10-CM

## 2014-09-29 MED ORDER — VALACYCLOVIR HCL 1 G PO TABS: 1000.0000 mg | ORAL_TABLET | Freq: Three times a day (TID) | ORAL | Status: AC

## 2014-09-29 MED ORDER — PREDNISOLONE ACETATE 1 % OP SUSP: 1.0000 [drp] | Freq: Four times a day (QID) | OPHTHALMIC | Status: DC

## 2014-09-29 NOTE — Discharge Instructions (Signed)
Thank you for coming in today. Follow-up with an ophthalmologist on Monday. Call the clinic if you cannot be seen by the same ophthalmologist who saw you last year. Start Valtrex 3 times daily and use the eyedrops as directed. Return as needed  Shingles Shingles (herpes zoster) is an infection that is caused by the same virus that causes chickenpox (varicella). The infection causes a painful skin rash and fluid-filled blisters, which eventually break open, crust over, and heal. It may occur in any area of the body, but it usually affects only one side of the body or face. The pain of shingles usually lasts about 1 month. However, some people with shingles may develop long-term (chronic) pain in the affected area of the body. Shingles often occurs many years after the person had chickenpox. It is more common:  In people older than 50 years.  In people with weakened immune systems, such as those with HIV, AIDS, or cancer.  In people taking medicines that weaken the immune system, such as transplant medicines.  In people under great stress. CAUSES  Shingles is caused by the varicella zoster virus (VZV), which also causes chickenpox. After a person is infected with the virus, it can remain in the person's body for years in an inactive state (dormant). To cause shingles, the virus reactivates and breaks out as an infection in a nerve root. The virus can be spread from person to person (contagious) through contact with open blisters of the shingles rash. It will only spread to people who have not had chickenpox. When these people are exposed to the virus, they may develop chickenpox. They will not develop shingles. Once the blisters scab over, the person is no longer contagious and cannot spread the virus to others. SIGNS AND SYMPTOMS  Shingles shows up in stages. The initial symptoms may be pain, itching, and tingling in an area of the skin. This pain is usually described as burning, stabbing, or  throbbing.In a few days or weeks, a painful red rash will appear in the area where the pain, itching, and tingling were felt. The rash is usually on one side of the body in a band or belt-like pattern. Then, the rash usually turns into fluid-filled blisters. They will scab over and dry up in approximately 2-3 weeks. Flu-like symptoms may also occur with the initial symptoms, the rash, or the blisters. These may include:  Fever.  Chills.  Headache.  Upset stomach. DIAGNOSIS  Your health care provider will perform a skin exam to diagnose shingles. Skin scrapings or fluid samples may also be taken from the blisters. This sample will be examined under a microscope or sent to a lab for further testing. TREATMENT  There is no specific cure for shingles. Your health care provider will likely prescribe medicines to help you manage the pain, recover faster, and avoid long-term problems. This may include antiviral drugs, anti-inflammatory drugs, and pain medicines. HOME CARE INSTRUCTIONS   Take a cool bath or apply cool compresses to the area of the rash or blisters as directed. This may help with the pain and itching.   Take medicines only as directed by your health care provider.   Rest as directed by your health care provider.  Keep your rash and blisters clean with mild soap and cool water or as directed by your health care provider.  Do not pick your blisters or scratch your rash. Apply an anti-itch cream or numbing creams to the affected area as directed by your health  care provider.  Keep your shingles rash covered with a loose bandage (dressing).  Avoid skin contact with:  Babies.   Pregnant women.   Children with eczema.   Elderly people with transplants.   People with chronic illnesses, such as leukemia or AIDS.   Wear loose-fitting clothing to help ease the pain of material rubbing against the rash.  Keep all follow-up visits as directed by your health care  provider.If the area involved is on your face, you may receive a referral for a specialist, such as an eye doctor (ophthalmologist) or an ear, nose, and throat (ENT) doctor. Keeping all follow-up visits will help you avoid eye problems, chronic pain, or disability.  SEEK IMMEDIATE MEDICAL CARE IF:   You have facial pain, pain around the eye area, or loss of feeling on one side of your face.  You have ear pain or ringing in your ear.  You have loss of taste.  Your pain is not relieved with prescribed medicines.   Your redness or swelling spreads.   You have more pain and swelling.  Your condition is worsening or has changed.   You have a fever. MAKE SURE YOU:  Understand these instructions.  Will watch your condition.  Will get help right away if you are not doing well or get worse. Document Released: 07/27/2005 Document Revised: 12/11/2013 Document Reviewed: 03/10/2012 [facility] Patient Information 2015 [place], [place]. This information is not intended to replace advice given to you by your health care provider. Make sure you discuss any questions you have with your health care provider.

## 2014-09-29 NOTE — ED Notes (Signed)
States right eye irritation with redness began yesterday; no known injury or foreign body; is concerned because has history of shingles which involved eye. Vision with glasses wnl.

## 2014-09-29 NOTE — ED Provider Notes (Signed)
Rachel Carr is a 64 y.o. female who presents to Urgent Care today for right eye pain. Patient has a one-day history of worsening right eye pain and redness. She notes some burning sensations. She denies any significant photophobia. 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 Her current symptoms are somewhat consistent with previous episodes of zoster ophthalmicus. She has not tried any medications yet.   Past Medical History  Diagnosis Date  . Esophageal ulcer 09-2010    gastritis Dr. Glennon Hamilton  . Gastric polyps     colonoscopy 2012(due again for repeat 2017)  . Shingles     on the right eye  . Barrett's syndrome   . Esophageal stricture   . Shingles    Past Surgical History  Procedure Laterality Date  . Rt knee surgery    . Cholecystectomy    . Abdominal hysterectomy      endometriosis  . Carpal tunnel release    . Radiofrequency ablation     History  Substance Use Topics  . Smoking status: Current Every Day Smoker -- 0.50 packs/day for 20 years    Types: Cigarettes  . Smokeless tobacco: Never Used  . Alcohol Use: No   ROS as above Medications: No current facility-administered medications for this encounter.   Current Outpatient Prescriptions  Medication Sig Dispense Refill  . AMBULATORY NON FORMULARY MEDICATION Take 2 tablets by mouth as needed. Medication Name: Prunelax    . omeprazole (PRILOSEC) 40 MG capsule Take 40 mg by mouth daily.    . prednisoLONE acetate (PRED FORTE) 1 % ophthalmic suspension Place 1 drop into the right eye 4 (four) times daily. 5 mL 0  . valACYclovir (VALTREX) 1000 MG tablet Take 1 tablet (1,000 mg total) by mouth 3 (three) times daily. 30 tablet 0  . varenicline (CHANTIX STARTING MONTH PAK) 0.5 MG X 11 & 1 MG X 42 tablet Take one 0.5 mg tablet by mouth once daily for 3 days, then increase to one 0.5 mg tablet twice daily for 4 days, then increase to one 1 mg tablet twice daily. 53 tablet 0   No Known Allergies   Exam:   BP 114/75 mmHg  Pulse 73  Temp(Src) 97.5 F (36.4 C) (Oral)  Resp 16  Ht 5\' 1"  (1.549 m)  Wt 177 lb (80.287 kg)  BMI 33.46 kg/m2  SpO2 100% Gen: Well NAD HEENT: ,  MMM right is significant scleral/conjunctival injection. Pupils are equal round reactive to light bilaterally. Eye motion is normal. No uptake with fluorescein stain. Visual Acuity:  Right Eye Distance: 20/25 Left Eye Distance: 20/25 Bilateral Distance: 20/20 (with correction)  Lungs: Normal work of breathing. CTABL Heart: RRR no MRG Abd: NABS, Soft. Nondistended, Nontender Exts: Brisk capillary refill, warm and well perfused.   No results found for this or any previous visit (from the past 24 hour(s)). No results found.  Assessment and Plan: 64 y.o. 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 Follow-up with ophthalmology. Treat with Pred Forte today and Valtrex as previous. Return as needed.   Discussed warning signs or symptoms. Please see discharge instructions. Patient expresses understanding.     Gregor Hams, MD 09/29/14 [phone]

## 2014-10-01 ENCOUNTER — Telehealth: Payer: Self-pay

## 2014-10-01 DIAGNOSIS — B0230 Zoster ocular disease, unspecified: Secondary | ICD-10-CM

## 2014-10-01 NOTE — Telephone Encounter (Signed)
Patient states she was seen by [facility] care the first time she had shingles. Signed referral.

## 2014-10-01 NOTE — Telephone Encounter (Signed)
Patient called and requested a referral to [facility]. She has zoster ophthalmicus. I called and left patient a message stating that [facility] is optometrist and not ophthalmologist. Awaiting a call back.

## 2014-10-01 NOTE — Telephone Encounter (Signed)
[facility] called and states that they are seeing this patient tomorrow for shingles and they need a Referral since she has [employer]. Can you enter this referral

## 2015-05-10 ENCOUNTER — Ambulatory Visit (INDEPENDENT_AMBULATORY_CARE_PROVIDER_SITE_OTHER): Payer: BLUE CROSS/BLUE SHIELD | Admitting: Osteopathic Medicine

## 2015-05-10 ENCOUNTER — Encounter: Payer: Self-pay | Admitting: Osteopathic Medicine

## 2015-05-10 VITALS — BP 119/73 | HR 71 | Temp 97.8°F | Ht 61.0 in | Wt 173.0 lb

## 2015-05-10 DIAGNOSIS — B9689 Other specified bacterial agents as the cause of diseases classified elsewhere: Secondary | ICD-10-CM

## 2015-05-10 DIAGNOSIS — J019 Acute sinusitis, unspecified: Secondary | ICD-10-CM

## 2015-05-10 MED ORDER — AMOXICILLIN-POT CLAVULANATE 875-125 MG PO TABS: 1.0000 | ORAL_TABLET | Freq: Two times a day (BID) | ORAL | Status: DC

## 2015-05-10 MED ORDER — FLUTICASONE PROPIONATE 50 MCG/ACT NA SUSP: 2.0000 | Freq: Every day | NASAL | Status: DC

## 2015-05-10 NOTE — Progress Notes (Signed)
HPI: Rachel Carr is a 64 y.o. female who presents to [place]  today for chief complaint of:  Chief Complaint  Patient presents with  . Sinus Problem  . Nasal Congestion    . Location: sinuses . Quality: pressure . Severity: moderate . Duration: 3 weeks . Context: no sick contacts, no recent travel . Modifying factors: has tried the following OTC medications: neti pot  without relief . Assoc signs/symptoms: no fever/chills, no productive cough   Past medical, social and family history reviewed: Past Medical History  Diagnosis Date  . Esophageal ulcer 09-2010    gastritis Dr. Glennon Hamilton  . Gastric polyps     colonoscopy 2012(due again for repeat 2017)  . Shingles     on the right eye  . Barrett's syndrome   . Esophageal stricture   . Shingles    Past Surgical History  Procedure Laterality Date  . Rt knee surgery    . Cholecystectomy    . Abdominal hysterectomy      endometriosis  . Carpal tunnel release    . Radiofrequency ablation     Social History  Substance Use Topics  . Smoking status: Current Every Day Smoker -- 0.50 packs/day for 20 years    Types: Cigarettes  . Smokeless tobacco: Never Used  . Alcohol Use: No   Family History  Problem Relation Age of Onset  . Heart disease Mother   . Diabetes Father   . Cancer Father     prostrate  . Cancer Sister     esophagus  . Diabetes Sister   . Cancer Brother     kidney    Current Outpatient Prescriptions  Medication Sig Dispense Refill  . AMBULATORY NON FORMULARY MEDICATION Take 2 tablets by mouth as needed. Medication Name: Prunelax    . omeprazole (PRILOSEC) 40 MG capsule Take 40 mg by mouth daily.    .      . varenicline (CHANTIX STARTING MONTH PAK) 0.5 MG X 11 & 1 MG X 42 tablet Take one 0.5 mg tablet by mouth once daily for 3 days, then increase to one 0.5 mg tablet twice daily for 4 days, then increase to one 1 mg tablet twice daily. 53 tablet 0   No current  facility-administered medications for this visit.   No Known Allergies    Review of Systems: CONSTITUTIONAL: no fever/chills HEAD/EYES/EARS/NOSE/THROAT: no headache, no vision change or hearing change, yes sore throat CARDIAC: No chest pain/pressure/palpitations, no orthopnea RESPIRATORY: no cough, no shortness of breath GASTROINTESTINAL: no nausea, no vomiting, no abdominal pain/blood in stool/diarrhea/constipation MUSCULOSKELETAL: no myalgia/arthralgia   Exam:  There were no vitals taken for this visit. Constitutional: VSS, see above. General Appearance: alert, well-developed, well-nourished, NAD Eyes: Normal lids and conjunctive, non-icteric sclera, PERRLA Ears, Nose, Mouth, Throat: Normal external inspection ears/nares/mouth/lips/gums, normal TM bilaterally, MMM; posterior pharynx without erythema, without exudate Neck: No masses, trachea midline. No thyroid enlargement/tenderness/mass appreciated, normal lymph nodes Respiratory: Normal respiratory effort. no wheeze/rhonchi/rales Cardiovascular: S1/S2 normal, no murmur/rub/gallop auscultated. RRR. No carotid bruit or JVD. No lower extremity edema.     No results found for this or any previous visit (from the past 72 hour(s)).    ASSESSMENT/PLAN:  Acute bacterial rhinosinusitis - Plan: fluticasone (FLONASE) 50 MCG/ACT nasal spray, amoxicillin-clavulanate (AUGMENTIN) 875-125 MG tablet   Given long course of illness, likely bacterial. Pt advised take all antibiotics even if feeling better, let us know if no improvement even after abx are done.  RTC sooner if any problems, otherwise w/u with Dr Madilyn Fireman. Patient has been educated on significant possible side effects of medication and is instructed to contact me or other medical professional with any concerns about side effects.

## 2015-05-13 ENCOUNTER — Encounter: Payer: Self-pay | Admitting: Family Medicine

## 2015-05-13 ENCOUNTER — Ambulatory Visit (INDEPENDENT_AMBULATORY_CARE_PROVIDER_SITE_OTHER): Payer: BLUE CROSS/BLUE SHIELD | Admitting: Family Medicine

## 2015-05-13 VITALS — BP 108/64 | HR 70 | Wt 173.0 lb

## 2015-05-13 DIAGNOSIS — R1013 Epigastric pain: Secondary | ICD-10-CM

## 2015-05-13 DIAGNOSIS — R110 Nausea: Secondary | ICD-10-CM | POA: Diagnosis not present

## 2015-05-13 DIAGNOSIS — Z9889 Other specified postprocedural states: Secondary | ICD-10-CM

## 2015-05-13 MED ORDER — DEXLANSOPRAZOLE 60 MG PO CPDR: 60.0000 mg | DELAYED_RELEASE_CAPSULE | Freq: Every day | ORAL | Status: DC

## 2015-05-13 MED ORDER — SUCRALFATE 1 GM/10ML PO SUSP: 1.0000 g | Freq: Three times a day (TID) | ORAL | Status: DC

## 2015-05-13 NOTE — Progress Notes (Signed)
   Subjective:    Patient ID: Rachel Carr, female    DOB: 1950-09-06, 64 y.o.   MRN: [id number]  HPI Abdominal Pain x 1-2 months -  pt has hx of barrett's dx. she reports that she has nausea, no vomiting, and her BM's have been runny/thin. she has been having trouble swallowing. Her last colonoscopy was in 2012, approximately 4 years ago. Pain in between her breasts after eating.  She taking the 40mg  omeprazole BID.  No other medication.  No GERD sxs.  .   She avoids soda and only drinks water. Her last endoscopy was about a year ago.  Review of Systems     Objective:   Physical Exam  Constitutional: She is oriented to person, place, and time. She appears well-developed and well-nourished.  HENT:  Head: Normocephalic and atraumatic.  Cardiovascular: Normal rate, regular rhythm and normal heart sounds.   Pulmonary/Chest: Effort normal and breath sounds normal.  Abdominal: Soft. Bowel sounds are normal. She exhibits no distension and no mass. There is tenderness. There is no rebound and no guarding.  Mild tenderness in teh epigastric area.    Neurological: She is alert and oriented to person, place, and time.  Skin: Skin is warm and dry.  Psychiatric: She has a normal mood and affect. Her behavior is normal.          Assessment & Plan:  Epigastric pain - I really think we need to probably get her back in with Dr. Glennon Hamilton her gastroenterologist. Burnis Medin try switching her from Prilosec to dexilant. She had taken in the past but have palms with insurance covering it. We will also add back Carafate before meals to help coat the stomach. We'll try) suspension but may need to switch to tabs which she can crush if needed if the liquid is not covered. She has had a fundoplication so would really be better if she is able to get the liquid and safer ultimately. Continue to watch diet. Also do additional blood work just to evaluate for hepatitis and pancreatitis.

## 2015-05-14 ENCOUNTER — Other Ambulatory Visit: Payer: Self-pay | Admitting: Family Medicine

## 2015-05-14 LAB — LIPASE: LIPASE: 8 U/L (ref 7–60)

## 2015-05-14 LAB — CBC WITH DIFFERENTIAL/PLATELET
BASOS ABS: 0.1 10*3/uL (ref 0.0–0.1)
BASOS PCT: 1 % (ref 0–1)
EOS ABS: 0.2 10*3/uL (ref 0.0–0.7)
EOS PCT: 3 % (ref 0–5)
HCT: 39.3 % (ref 36.0–46.0)
Hemoglobin: 13.5 g/dL (ref 12.0–15.0)
LYMPHS ABS: 2.8 10*3/uL (ref 0.7–4.0)
Lymphocytes Relative: 40 % (ref 12–46)
MCH: 30.8 pg (ref 26.0–34.0)
MCHC: 34.4 g/dL (ref 30.0–36.0)
MCV: 89.7 fL (ref 78.0–100.0)
MPV: 10.8 fL (ref 8.6–12.4)
Monocytes Absolute: 0.7 10*3/uL (ref 0.1–1.0)
Monocytes Relative: 10 % (ref 3–12)
NEUTROS PCT: 46 % (ref 43–77)
Neutro Abs: 3.2 10*3/uL (ref 1.7–7.7)
PLATELETS: 247 10*3/uL (ref 150–400)
RBC: 4.38 MIL/uL (ref 3.87–5.11)
RDW: 13.3 % (ref 11.5–15.5)
WBC: 7 10*3/uL (ref 4.0–10.5)

## 2015-05-14 LAB — COMPLETE METABOLIC PANEL WITH GFR
ALT: 11 U/L (ref 6–29)
AST: 12 U/L (ref 10–35)
Albumin: 4.1 g/dL (ref 3.6–5.1)
Alkaline Phosphatase: 81 U/L (ref 33–130)
BILIRUBIN TOTAL: 0.5 mg/dL (ref 0.2–1.2)
BUN: 15 mg/dL (ref 7–25)
CHLORIDE: 103 mmol/L (ref 98–110)
CO2: 29 mmol/L (ref 20–31)
CREATININE: 0.89 mg/dL (ref 0.50–0.99)
Calcium: 9.4 mg/dL (ref 8.6–10.4)
GFR, EST AFRICAN AMERICAN: 79 mL/min (ref >=60)
GFR, Est Non African American: 69 mL/min (ref >=60)
GLUCOSE: 99 mg/dL (ref 65–99)
Potassium: 4.7 mmol/L (ref 3.5–5.3)
SODIUM: 139 mmol/L (ref 135–146)
TOTAL PROTEIN: 6.5 g/dL (ref 6.1–8.1)

## 2015-05-14 LAB — AMYLASE: AMYLASE: 18 U/L (ref 0–105)

## 2015-05-14 MED ORDER — PANTOPRAZOLE SODIUM 40 MG PO TBEC
40.0000 mg | DELAYED_RELEASE_TABLET | Freq: Every day | ORAL | Status: DC
Start: 2015-05-14 — End: 2016-03-27

## 2015-05-14 MED ORDER — SUCRALFATE 1 G PO TABS: 1.0000 g | ORAL_TABLET | Freq: Three times a day (TID) | ORAL | Status: DC

## 2015-05-14 NOTE — Progress Notes (Signed)
Note received from the pharmacy that the liquid suspension of Carafate is not covered but the tabs are. The insurance will also not covered  Dexilant. Will send over prescription for pantoprazole. If that is not covered or not effective then consider doing prior authorization for that [place] .

## 2015-05-16 NOTE — Progress Notes (Signed)
Called and informed pt of recommendations .Rachel Carr  

## 2015-05-26 ENCOUNTER — Emergency Department: Admission: EM | Admit: 2015-05-26 | Discharge: 2015-05-26 | Payer: BLUE CROSS/BLUE SHIELD | Source: Home / Self Care

## 2015-08-28 ENCOUNTER — Other Ambulatory Visit: Payer: Self-pay | Admitting: Family Medicine

## 2015-08-28 DIAGNOSIS — Z139 Encounter for screening, unspecified: Secondary | ICD-10-CM

## 2015-09-04 ENCOUNTER — Ambulatory Visit (INDEPENDENT_AMBULATORY_CARE_PROVIDER_SITE_OTHER): Payer: BLUE CROSS/BLUE SHIELD

## 2015-09-04 DIAGNOSIS — Z1231 Encounter for screening mammogram for malignant neoplasm of breast: Secondary | ICD-10-CM | POA: Diagnosis not present

## 2015-09-04 DIAGNOSIS — Z139 Encounter for screening, unspecified: Secondary | ICD-10-CM

## 2015-09-21 ENCOUNTER — Emergency Department
Admission: EM | Admit: 2015-09-21 | Discharge: 2015-09-21 | Disposition: A | Discharge status: Home / Self Care | Payer: BLUE CROSS/BLUE SHIELD | Source: Home / Self Care | Attending: Family Medicine | Admitting: Family Medicine

## 2015-09-21 ENCOUNTER — Encounter: Payer: Self-pay | Admitting: Emergency Medicine

## 2015-09-21 DIAGNOSIS — H9203 Otalgia, bilateral: Secondary | ICD-10-CM | POA: Diagnosis not present

## 2015-09-21 DIAGNOSIS — J0100 Acute maxillary sinusitis, unspecified: Secondary | ICD-10-CM

## 2015-09-21 DIAGNOSIS — R519 Headache, unspecified: Secondary | ICD-10-CM

## 2015-09-21 DIAGNOSIS — R51 Headache: Secondary | ICD-10-CM | POA: Diagnosis not present

## 2015-09-21 LAB — POCT INFLUENZA A/B
Influenza A, POC: NEGATIVE
Influenza B, POC: NEGATIVE

## 2015-09-21 MED ORDER — ONDANSETRON HCL 4 MG PO TABS: 4.0000 mg | ORAL_TABLET | Freq: Four times a day (QID) | ORAL | Status: DC

## 2015-09-21 MED ORDER — AMOXICILLIN-POT CLAVULANATE 875-125 MG PO TABS: 1.0000 | ORAL_TABLET | Freq: Two times a day (BID) | ORAL | Status: DC

## 2015-09-21 MED ORDER — ONDANSETRON HCL 4 MG PO TABS
4.0000 mg | ORAL_TABLET | Freq: Once | ORAL | Status: AC
  Administered 2015-09-21: 4 mg via ORAL

## 2015-09-21 NOTE — ED Provider Notes (Signed)
CSN: [id number]     Arrival date & time 09/21/15  [id number] History   First MD Initiated Contact with Patient 09/21/15 1000     Chief Complaint  Patient presents with  . Nausea   (Consider location/radiation/quality/duration/timing/severity/associated sxs/prior Treatment) HPI Pt is a 65yo female presenting to [facility] with c/o severe facial pain and pressure with bilateral ear pain, nasal congestion, and nausea for 2 days.  She has been using a Netti pot and taking Aleve without relief.  She is unsure if she had a fever. She has nausea but denies vomiting stating she had nissen fundoplication and is unable to vomit.  Denies diarrhea. Denies sick contacts or recent travel.   Past Medical History  Diagnosis Date  . Esophageal ulcer 09-2010    gastritis Dr. Glennon Hamilton  . Gastric polyps     colonoscopy 2012(due again for repeat 2017)  . Shingles     on the right eye  . Barrett's syndrome   . Esophageal stricture   . Shingles    Past Surgical History  Procedure Laterality Date  . Rt knee surgery    . Cholecystectomy    . Abdominal hysterectomy      endometriosis  . Carpal tunnel release    . Radiofrequency ablation    . Nissen fundoplication  0000000   Family History  Problem Relation Age of Onset  . Heart disease Mother   . Diabetes Father   . Cancer Father     prostrate  . Cancer Sister     esophagus  . Diabetes Sister   . Cancer Brother     kidney   Social History  Substance Use Topics  . Smoking status: Current Every Day Smoker -- 0.50 packs/day for 20 years    Types: Cigarettes  . Smokeless tobacco: Never Used  . Alcohol Use: No   OB History    No data available     Review of Systems  Constitutional: Positive for fatigue. Negative for fever and chills.  HENT: Positive for congestion, ear pain, postnasal drip, rhinorrhea and sinus pressure. Negative for sore throat, trouble swallowing and voice change.   Respiratory: Negative for cough and shortness of breath.    Cardiovascular: Negative for chest pain and palpitations.  Gastrointestinal: Positive for nausea. Negative for vomiting, abdominal pain and diarrhea.  Musculoskeletal: Negative for myalgias, back pain and arthralgias.  Skin: Negative for rash.  Neurological: Positive for dizziness and headaches. Negative for light-headedness.    Allergies  Review of patient's allergies indicates no known allergies.  Home Medications   Prior to Admission medications   Medication Sig Start Date End Date Taking? Authorizing Provider  AMBULATORY NON FORMULARY MEDICATION Take 2 tablets by mouth as needed. Medication Name: Prunelax    Historical Provider, MD  amoxicillin-clavulanate (AUGMENTIN) 875-125 MG tablet Take 1 tablet by mouth 2 (two) times daily. One po bid x 7 days 09/21/15   Noland Fordyce, PA-C  fluticasone [facility]) 50 MCG/ACT nasal spray Place 2 sprays into both nostrils daily. 05/10/15   Emeterio Reeve, DO  ondansetron (ZOFRAN) 4 MG tablet Take 1 tablet (4 mg total) by mouth every 6 (six) hours. 09/21/15   Noland Fordyce, PA-C  pantoprazole (PROTONIX) 40 MG tablet Take 1 tablet (40 mg total) by mouth daily. 05/14/15   Hali Marry, MD  sucralfate (CARAFATE) 1 G tablet Take 1 tablet (1 g total) by mouth 4 (four) times daily -  with meals and at bedtime. 05/14/15   Hali Marry, MD  Meds Ordered and Administered this Visit   Medications  ondansetron (ZOFRAN) tablet 4 mg (4 mg Oral Given 09/21/15 1003)    BP 102/71 mmHg  Pulse 79  Temp(Src) 98.5 F (36.9 C) (Oral)  Wt 171 lb (77.565 kg)  SpO2 98% No data found.   Physical Exam  Constitutional: She appears well-developed and well-nourished. No distress.  HENT:  Head: Normocephalic and atraumatic.  Right Ear: Hearing, external ear and ear canal normal. A middle ear effusion is present.  Left Ear: Hearing, tympanic membrane, external ear and ear canal normal.  Nose: Mucosal edema present. Right sinus exhibits maxillary sinus  tenderness. Right sinus exhibits no frontal sinus tenderness. Left sinus exhibits maxillary sinus tenderness. Left sinus exhibits no frontal sinus tenderness.  Mouth/Throat: Uvula is midline, oropharynx is clear and moist and mucous membranes are normal.  Eyes: Conjunctivae are normal. No scleral icterus.  Neck: Normal range of motion. Neck supple.  Cardiovascular: Normal rate, regular rhythm and normal heart sounds.   Pulmonary/Chest: Effort normal and breath sounds normal. No stridor. No respiratory distress. She has no wheezes. She has no rales. She exhibits no tenderness.  Abdominal: Soft. She exhibits no distension. There is no tenderness.  Musculoskeletal: Normal range of motion.  Lymphadenopathy:    She has no cervical adenopathy.  Neurological: She is alert.  Skin: Skin is warm and dry. She is not diaphoretic.  Nursing note and vitals reviewed.   ED Course  Procedures (including critical care time)  Labs Review Labs Reviewed  POCT INFLUENZA A/B    Imaging Review No results found.    MDM   1. Acute maxillary sinusitis, recurrence not specified   2. Frontal headache   3. Ear pain, bilateral    Pt c/o severe facial pain with bilateral ear pain and pressure.   She has tried Aleve and sinus rinses without relief of pain. She does have sinus tenderness on exam and middle ear effusion in Right ear. Due to severity of pain, will tx with augmentin. Pt declined prednisone stating she has not reacted well to it in the past.  Advised pt to use acetaminophen and ibuprofen as needed for fever and pain. Encouraged rest and fluids. F/u with PCP in 7-10 days if not improving, sooner if worsening. Pt verbalized understanding and agreement with tx plan.     Noland Fordyce, PA-C 09/21/15 1029

## 2015-09-21 NOTE — ED Notes (Signed)
Pt  C/o nausea, HA, bilateral ear and facial pain x2 days. Unknown fever?

## 2015-09-21 NOTE — Discharge Instructions (Signed)

## 2015-11-19 ENCOUNTER — Encounter: Payer: Self-pay | Admitting: Family Medicine

## 2015-11-19 ENCOUNTER — Ambulatory Visit (INDEPENDENT_AMBULATORY_CARE_PROVIDER_SITE_OTHER): Payer: BLUE CROSS/BLUE SHIELD | Admitting: Family Medicine

## 2015-11-19 ENCOUNTER — Other Ambulatory Visit (HOSPITAL_COMMUNITY)
Admission: RE | Admit: 2015-11-19 | Discharge: 2015-11-19 | Disposition: A | Discharge status: Home / Self Care | Payer: BLUE CROSS/BLUE SHIELD | Source: Ambulatory Visit | Attending: Family Medicine | Admitting: Family Medicine

## 2015-11-19 VITALS — BP 128/65 | HR 71 | Wt 174.0 lb

## 2015-11-19 DIAGNOSIS — Z1159 Encounter for screening for other viral diseases: Secondary | ICD-10-CM | POA: Diagnosis not present

## 2015-11-19 DIAGNOSIS — Z1151 Encounter for screening for human papillomavirus (HPV): Secondary | ICD-10-CM | POA: Diagnosis not present

## 2015-11-19 DIAGNOSIS — Z114 Encounter for screening for human immunodeficiency virus [HIV]: Secondary | ICD-10-CM

## 2015-11-19 DIAGNOSIS — Z13220 Encounter for screening for lipoid disorders: Secondary | ICD-10-CM | POA: Diagnosis not present

## 2015-11-19 DIAGNOSIS — Z01411 Encounter for gynecological examination (general) (routine) with abnormal findings: Secondary | ICD-10-CM | POA: Insufficient documentation

## 2015-11-19 DIAGNOSIS — Z0000 Encounter for general adult medical examination without abnormal findings: Secondary | ICD-10-CM | POA: Diagnosis not present

## 2015-11-19 DIAGNOSIS — R635 Abnormal weight gain: Secondary | ICD-10-CM

## 2015-11-19 NOTE — Progress Notes (Signed)
Subjective:     Rachel Carr is a 65 y.o. female and is here for a comprehensive physical exam. The patient reports no problems. She is excited she is actually retiring next week. She will go Medicare. He does exercise some. She reports that her last eye exam was in 2016 performed at [facility]. She declines any vaccinations.  Social History   Social History  . Marital Status: Married    Spouse Name: N/A  . Number of Children: N/A  . Years of Education: N/A   Occupational History  . Not on file.   Social History Main Topics  . Smoking status: Current Every Day Smoker -- 0.50 packs/day for 20 years    Types: Cigarettes  . Smokeless tobacco: Never Used  . Alcohol Use: No  . Drug Use: No  . 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   Other Topics Concern  . Not on file   Social History Narrative   Health Maintenance  Topic Date Due  . Hepatitis C Screening  06-05-1951  . HIV Screening  11/21/1965  . INFLUENZA VACCINE  07/09/2016 (Originally 03/10/2016)  . ZOSTAVAX  11/18/2020 (Originally 11/22/2010)  . TETANUS/TDAP  08/10/2017  . MAMMOGRAM  09/03/2017  . COLONOSCOPY  05/10/2020    The following portions of the patient's history were reviewed and updated as appropriate: allergies, current medications, past family history, past medical history, past social history, past surgical history and problem list.  Review of Systems A comprehensive review of systems was negative.   Objective:    BP 128/65 mmHg  Pulse 71  Wt 174 lb (78.926 kg)  SpO2 100% General appearance: alert, cooperative and appears stated age Head: Normocephalic, without obvious abnormality, atraumatic Eyes: conj clear, EOMI Ears: normal TM's and external ear canals both ears Nose: Nares normal. Septum midline. Mucosa normal. No drainage or sinus tenderness. Throat: lips, mucosa, and tongue normal; teeth and gums normal Neck: no adenopathy, no carotid bruit, no JVD,  supple, symmetrical, trachea midline and thyroid not enlarged, symmetric, no tenderness/mass/nodules Back: symmetric, no curvature. ROM normal. No CVA tenderness. Lungs: clear to auscultation bilaterally Breasts: normal appearance, no masses or tenderness Heart: regular rate and rhythm, S1, S2 normal, no murmur, click, rub or gallop Abdomen: soft, non-tender; bowel sounds normal; no masses,  no organomegaly Pelvic: external genitalia normal, no adnexal masses or tenderness, rectovaginal septum normal, vagina normal without discharge and vaginal walls normal.  vaginal cuff swab performed.  Extremities: extremities normal, atraumatic, no cyanosis or edema Pulses: 2+ and symmetric Skin: Skin color, texture, turgor normal. No rashes or lesions Lymph nodes: Cervical, supraclavicular, and axillary nodes normal. Neurologic: Alert and oriented X 3, normal strength and tone. Normal symmetric reflexes. Normal coordination and gait    Assessment:    Healthy female exam.      Plan:     See After Visit Summary for Counseling Recommendations  Keep up a regular exercise program and make sure you are eating a healthy diet Try to eat 4 servings of dairy a day, or if you are lactose intolerant take a calcium with vitamin D daily.   Vaginal cuff swab performed today.   Abnormal weight gain-she has noted she's gained some weight even though she hasn't changed her diet or her exercise or activity levels. No family history of thyroid disease but we will check her thyroid and check for anemia. Also explained that she may need to work on increasing her exercise  to counteract the slowing metabolism as she ages. He also need to continue to work on portion control.  Declines vaccinations.  Mammogram is up-to-date.  Colonoscopy is up-to-date.

## 2015-11-19 NOTE — Patient Instructions (Signed)
Keep up a regular exercise program and make sure you are eating a healthy diet Try to eat 4 servings of dairy a day, or if you are lactose intolerant take a calcium with vitamin D daily.  Your vaccines are up to date.   

## 2015-11-20 ENCOUNTER — Encounter: Payer: Self-pay | Admitting: Family Medicine

## 2015-11-20 DIAGNOSIS — R7301 Impaired fasting glucose: Secondary | ICD-10-CM | POA: Insufficient documentation

## 2015-11-20 LAB — COMPLETE METABOLIC PANEL WITH GFR
ALBUMIN: 3.8 g/dL (ref 3.6–5.1)
ALT: 13 U/L (ref 6–29)
AST: 15 U/L (ref 10–35)
Alkaline Phosphatase: 79 U/L (ref 33–130)
BILIRUBIN TOTAL: 0.5 mg/dL (ref 0.2–1.2)
BUN: 17 mg/dL (ref 7–25)
CALCIUM: 8.7 mg/dL (ref 8.6–10.4)
CO2: 25 mmol/L (ref 20–31)
CREATININE: 0.75 mg/dL (ref 0.50–0.99)
Chloride: 104 mmol/L (ref 98–110)
GFR, Est Non African American: 85 mL/min (ref >=60)
Glucose, Bld: 79 mg/dL (ref 65–99)
Potassium: 4.4 mmol/L (ref 3.5–5.3)
Sodium: 139 mmol/L (ref 135–146)
TOTAL PROTEIN: 6.2 g/dL (ref 6.1–8.1)

## 2015-11-20 LAB — CBC
HCT: 39.2 % (ref 35.0–45.0)
Hemoglobin: 13.2 g/dL (ref 11.7–15.5)
MCH: 31.4 pg (ref 27.0–33.0)
MCHC: 33.7 g/dL (ref 32.0–36.0)
MCV: 93.1 fL (ref 80.0–100.0)
MPV: 10.5 fL (ref 7.5–12.5)
PLATELETS: 247 10*3/uL (ref 140–400)
RBC: 4.21 MIL/uL (ref 3.80–5.10)
RDW: 13.7 % (ref 11.0–15.0)
WBC: 5.6 10*3/uL (ref 3.8–10.8)

## 2015-11-20 LAB — HEMOGLOBIN A1C
HEMOGLOBIN A1C: 5.7 % — AB (ref <5.7)
MEAN PLASMA GLUCOSE: 117 mg/dL

## 2015-11-20 LAB — LIPID PANEL
CHOLESTEROL: 176 mg/dL (ref 125–200)
HDL: 61 mg/dL (ref >=46)
LDL CALC: 98 mg/dL (ref <130)
TRIGLYCERIDES: 87 mg/dL (ref <150)
Total CHOL/HDL Ratio: 2.9 Ratio (ref <=5.0)
VLDL: 17 mg/dL (ref <30)

## 2015-11-20 LAB — HIV ANTIBODY (ROUTINE TESTING W REFLEX): HIV 1&2 Ab, 4th Generation: NONREACTIVE

## 2015-11-20 LAB — HEPATITIS C ANTIBODY: HCV Ab: NEGATIVE

## 2015-11-20 LAB — TSH: TSH: 0.89 mIU/L

## 2015-11-21 LAB — CYTOLOGY - PAP

## 2016-01-10 ENCOUNTER — Ambulatory Visit (INDEPENDENT_AMBULATORY_CARE_PROVIDER_SITE_OTHER): Payer: Medicare Other | Admitting: Osteopathic Medicine

## 2016-01-10 ENCOUNTER — Encounter: Payer: Self-pay | Admitting: Osteopathic Medicine

## 2016-01-10 VITALS — BP 133/79 | HR 77 | Ht 61.0 in | Wt 175.0 lb

## 2016-01-10 DIAGNOSIS — J019 Acute sinusitis, unspecified: Secondary | ICD-10-CM

## 2016-01-10 DIAGNOSIS — J069 Acute upper respiratory infection, unspecified: Secondary | ICD-10-CM

## 2016-01-10 MED ORDER — AMOXICILLIN-POT CLAVULANATE 875-125 MG PO TABS: 1.0000 | ORAL_TABLET | Freq: Two times a day (BID) | ORAL | Status: DC

## 2016-01-10 MED ORDER — IPRATROPIUM BROMIDE 0.03 % NA SOLN: 2.0000 | Freq: Three times a day (TID) | NASAL | Status: DC | PRN

## 2016-01-10 NOTE — Patient Instructions (Signed)
DR. Georganna Maxson'S HOME CARE INSTRUCTIONS: VIRAL ILLNESSES - ACHES/PAINS, SORE THROAT, SINUSITIS, COUGH, GASTRITIS   FRIST, A FEW NOTES ON OVER-THE-COUNTER MEDICATIONS!   USE CAUTION - MANY OVER-THE-COUNTER MEDICATIONS COME IN COMBINATIONS OF MULTIPLE GENERIC MEDICINES. FOR INSTANCE, NYQUIL HAS TYLENOL + COUGH MEDICINE + AN ANTIHISTAMINE, SO BE CAREFUL YOU'RE NOT TAKING A COMBINATION MEDICINE WHICH CONTAINS MEDICATIONS YOU'RE ALSO TAKING SEPARATELY (LIKE NYQUIL SYRUP PLUS TYLENOL PILLS).   YOUR PHARMACIST CAN HELP YOU AVOID MEDICATION INTERACTIONS AND DUPLICATIONS - ASK FOR THEIR HELP IF YOU ARE CONFUSED OR UNSURE ABOUT WHAT TO PURCHASE OVER-THE-COUNTER!   REMEMBER - IF YOU'RE EVER CONCERNED ABOUT MEDICATION SIDE EFFECTS, OR IF YOU'RE EVER CONCERNED YOUR SYMPTOMS ARE GETTING WORSE DESPITE TREATMENT, PLEASE CALL THE DOCTOR'S OFFICE! IF AFTER-HOURS, YOU CAN BE SEEN IN URGENT CARE OR, FOR SEVERE ILLNESS, PLEASE GO TO THE EMERGENCY ROOM.   TREAT SINUS CONGESTION, RUNNY NOSE & POSTNASAL DRIP:  Treat to increase airflow through sinuses, decrease congestion pain and prevent bacterial growth!   Remember, only 0.5-2% of sinus infections are due to a bacteria, the rest are due to a virus (usually the common cold) which will not get better with antibiotics!  NASAL SPRAYS: generally safe and should not interact with other medicines. Can take any of these medications, either alone or together... FLONASE (FLUTICASONE) - 2 sprays in each nostril twice per day (also a great allergy medicine to use long-term!) AFRIN (OXYMETOLAZONE) - use sparingly because it will cause rebound congestion, NEVER USE IN KIDS  SALINE NASAL SPRAY- no limit, but avoid use after other nasal sprays or it can wash the medicine away PRESCRTIPTION ATROVENT - as directed on prescription bottle  ANTIHISTAMINES: Helps dry out runny nose and decreases postnasal drip. Benadryl may cause drowsiness but other preparations should not be  as sedating. Certain kinds are not as safe in elderly individuals. OK to use unless Dr A says otherwise.  Only use one of the following... BENADRYL (DIPHENHYDRAMINE) - 25-50 mg every 6 hours ZYRTEC (CETIRIZINE) - 5-10 mg daily CLARITIN (LORATIDINE) - less potent. 10 mg daily ALLEGRA (FEXOFENADINE) - least likely to cause drowsiness! 180 mg daily or 60 mg twice per day  DECONGESTANTS: Helps dry out runny nose and helps with sinus pain. May cause insomnia, or sometimes elevated heart rate. Can cause problems if used often in people with high blood pressure. OK to use unless Dr A says otherwise. NEVER USE IN KIDS UNDER 2 YEARS OLD. Only use one of the following... SUDAFED (PSEUDOEPHEDINE) - 60 mg every 4 - 6 hours, also comes in 120 mg extended release every 12 hours, maximum 240 mg in 24 hours.  SUDAED PE (PHENYLEPHRINE) - 10 mg every 4 - 6 hours, maximum 60 mg per day  COMBINATIONS OF ABOVE ANTIHISTAMINES + DECONGESTANTS: these usually require you to show your ID at the pharmacy counter. You can also purchase these medicines separately as noted above.  Only use one of the following... ZYRTEC-D (CETIRIZINE + PSEUDOEPHEDRINE) - 12 hour formulation as directed CLARITIN-D (LORATIDINE + PSEUDOEPHEDRINE) - 12 and 24 hour formulations as directed ALLEGRA-D (FEXOFENADINE + PSEUDOEPHEDRINE) - 12 and 24 hour formulations as directed  PRESCRIPTION TREATMENT FOR SINUS PROBLEMS: Can include nasal sprays, pills, or antibiotics in the case of true bacterial infection. Not everyone needs an antibiotic but there are other medicines which will help you feel better while your body fights the infection!    TREAT COUGH & SORE THROAT:  Remember, cough is the body's way of protecting your   airways and lungs, it's a hard-wired reflex that is tough for medicines to treat!   Irritation to the airways will cause cough. This irritation is usually caused by upper airway problems like postnasal drip (treat as above)  and viral sore throat, but in severe cases can be due to lower airway problems like bronchitis or pneumonia, which a doctor can usually hear on exam of your lungs or see on an X-ray.  Sore throat is almost always due to a virus, but occasionally caused by certain strains of Strep, which requires antibiotics.   Exercise and smoking may make cough worse - take it easy while you're sick, and QUIT SMOKING!   Cough due to viral infection can linger for 2-3 weeks or so. If you're coughing longer than you think you should, or if the cough is severe, please make an appointment in the office - you may need a chest X-ray.  EXPECTORANT: Used to help clear airways, take these with PLENTY of water to help thin mucus secretions and make the mucus easier to cough up  Only use one of the following... ROBITUSSIN (DEXTROMETHORPHAN OR GUAIFENISEN depending on formulation)  MUCINEX (GUAIFENICEN) - usually longer acting  COUGH DROPS/LOZENGES: Whichever over-the-counter agent you prefer!  Here are some suggestions for ingredients to look for (can take both)... BENZOCAINE - numbing effect, also in CHLORASEPTIC THROAT SPRAY MENTHOL - cooling effect  HONEY: has gone head-to-head in several clinical trials with prescription cough medicines and found to be equally effective! Try 1 Teaspoon Honey + 2 Drops Lemon Juice, as much as you want to use. NONE FOR KIDS UNDER AGE 2  HERBAL TEA: There are certain ingredients which help "coat the throat" to relieve pain  such as ELM BARK, LICORICE ROOT, MARSHMALLOW ROOT  PRESCRIPTION TREATMENT FOR COUGH: Reserved for severe cases. Can include pills, syrups or inhalers.    TREAT ACHES & PAINS, FEVER:  Illness causes aches, pains and fever as your body increases its natural inflammation response to help fight the infection.   Rest, good hydration and nutrition, and taking anti-inflammatory medications will help.   Remember: a true fever is a temperature 100.4 or  higher. If you have a fever that is 105.0 or higher, that is a dangerous level and needs medical attention in the office or in the ER!  Can take both of these together if it's ok with your doctor... IBUPROFEN - 400-800 mg every 6 - 8 hours. Ibuprofen and similar medications can cause problems for people with heart disease or history of stomach ulcers, check with Dr A first if you're concerned. Lower doses are usually safe and effective.  TYLENOL (ACETAMINOPHEN) - 500-1000 mg every 6 hours. It won't cause problems with heart or stomach.     TREAT GASTROINTESTINAL SYMPTOMS:  Hydrate, hydrate, hydrate! Try drinking Gatorade/Powerade, broth/soup. Avoid milk and juice, these can make diarrhea worse. You can drink water, of course, but if you are also having vomiting and loose stool you are losing electrolytes which water alone can't replace.  IMMODIUM (LOPERAMIDE) - as directed on the bottle to help with loose stool PRESCRIPTION ZOFRAN (ONDANSETRON) OR PHENERGAN (PROMETHAZINE) - as directed to help nausea and vomiting. Try taking these before you eat if you are having trouble keeping food down.     REMEMBER - THE MOST IMPORTANT THINGS YOU CAN DO TO AVOID CATCHING OR SPREADING ILLNESS INCLUDE:   COVER YOUR COUGH WITH YOUR ARM, NOT WITH YOUR HANDS!   DISINFECT COMMONLY USED SURFACES (SUCH AS   TELEPHONES & DOORKNOBS) WHEN YOU OR SOMEONE CLOSE TO YOU IS FEELING SICK!   BE SURE VACCINES ARE UP TO DATE - GET A FLU SHOT EVERY YEAR! THE FLU CAN BE DEADLY FOR BABIES, ELDERLY FOLKS, AND PEOPLE WITH WEAK IMMUNE SYSTEMS - YOU SHOULD BE VACCINATED TO HELP PREVENT YOURSELF FROM GETTING SICK, BUT ALSO TO PREVENT SOMEONE ELSE FROM GETTING AN INFECTION WHICH MAY HOSPITALIZE OR KILL THEM.  GOOD NUTRITION AND HEALTHY LIFESTYLE WILL HELP YOUR IMMUNE SYSTEM YEAR-ROUND! THERE IS NO MAGIC SUPPLEMENT!  AND ABOVE ALL - WASH YOUR HANDS OFTEN!   

## 2016-01-10 NOTE — Progress Notes (Signed)
HPI: Rachel Carr is a 65 y.o. female who presents to [place]  today for chief complaint of:  Chief Complaint  Patient presents with  . Sinus Problem    . Location: Sinuses, frontal . Quality: Soreness fullness . Assoc signs/symptoms: see ROS . Duration: 4 days . Modifying factors: has tried the following OTC/Rx medications: NyQuil, Flonase   Past medical, social and family history reviewed. Current medications and allergies reviewed.     Review of Systems: CONSTITUTIONAL: no fever/chills HEAD/EYES/EARS/NOSE/THROAT: yes headache, no vision change or hearing change, yes sore throat CARDIAC: No chest pain/pressure/palpitations RESPIRATORY: no cough, no shortness of breath GASTROINTESTINAL: no nausea, no vomiting, no abdominal pain, no diarrhea MUSCULOSKELETAL: no myalgia/arthralgia   Exam:  BP 133/79 mmHg  Pulse 77  Ht 5\' 1"  (1.549 m)  Wt 175 lb (79.379 kg)  BMI 33.08 kg/m2 Constitutional: VSS, see above. General Appearance: alert, well-developed, well-nourished, NAD Eyes: Normal lids and conjunctive, non-icteric sclera, PERRLA Ears, Nose, Mouth, Throat: Normal external inspection ears/nares/mouth/lips/gums, normal TM, MMM;       posterior pharynx with erythema, without exudate Neck: No masses, trachea midline. normal lymph nodes Respiratory: Normal respiratory effort. No  wheeze/rhonchi/rales Cardiovascular: S1/S2 normal, no murmur/rub/gallop auscultated. RRR.   No results found for this or any previous visit (from the past 72 hour(s)). No results found.   ASSESSMENT/PLAN: Likely viral, Abx rx printed to fill if no better by Sunday or Monday or if worse, Atrovent and OTC symptomatic treatment   Viral URI - Plan: ipratropium (ATROVENT) 0.03 % nasal spray  Acute sinusitis, recurrence not specified, unspecified location - Plan: amoxicillin-clavulanate (AUGMENTIN) 875-125 MG tablet    Return if symptoms worsen or fail to  improve and as directed by PCP for routine care.

## 2016-02-21 ENCOUNTER — Inpatient Hospital Stay: Payer: Medicare Other | Admitting: Sports Medicine

## 2016-02-25 ENCOUNTER — Encounter: Payer: Self-pay | Admitting: Family Medicine

## 2016-02-25 DIAGNOSIS — R079 Chest pain, unspecified: Secondary | ICD-10-CM | POA: Insufficient documentation

## 2016-02-25 LAB — HEPATIC FUNCTION PANEL
ALT: 16 U/L (ref 7–35)
AST: 18 U/L (ref 13–35)
Alkaline Phosphatase: 72 U/L (ref 25–125)
BILIRUBIN, TOTAL: 0.6 mg/dL

## 2016-02-25 LAB — CBC AND DIFFERENTIAL
HEMATOCRIT: 37 % (ref 36–46)
Hemoglobin: 12.6 g/dL (ref 12.0–16.0)
WBC: 6.2 10*3/mL

## 2016-02-25 LAB — BASIC METABOLIC PANEL
BUN: 11 mg/dL (ref 4–21)
Creatinine: 0.8 mg/dL (ref 0.5–1.1)
GLUCOSE: 89 mg/dL
Potassium: 4 mmol/L (ref 3.4–5.3)
Sodium: 142 mmol/L (ref 137–147)

## 2016-02-25 LAB — TSH: TSH: 1.53 u[IU]/mL (ref 0.41–5.90)

## 2016-02-25 LAB — HEMOGLOBIN A1C: HEMOGLOBIN A1C: 5

## 2016-02-27 DIAGNOSIS — I200 Unstable angina: Secondary | ICD-10-CM | POA: Insufficient documentation

## 2016-02-28 ENCOUNTER — Inpatient Hospital Stay: Payer: Medicare Other | Admitting: Family Medicine

## 2016-03-26 ENCOUNTER — Telehealth: Payer: Self-pay | Admitting: Family Medicine

## 2016-03-26 ENCOUNTER — Ambulatory Visit (INDEPENDENT_AMBULATORY_CARE_PROVIDER_SITE_OTHER): Payer: Medicare Other | Admitting: Family Medicine

## 2016-03-26 ENCOUNTER — Encounter: Payer: Self-pay | Admitting: Family Medicine

## 2016-03-26 VITALS — BP 114/65 | HR 71 | Resp 16 | Ht 61.0 in | Wt 172.0 lb

## 2016-03-26 DIAGNOSIS — Z9861 Coronary angioplasty status: Secondary | ICD-10-CM | POA: Diagnosis not present

## 2016-03-26 DIAGNOSIS — F17200 Nicotine dependence, unspecified, uncomplicated: Secondary | ICD-10-CM

## 2016-03-26 DIAGNOSIS — I2510 Atherosclerotic heart disease of native coronary artery without angina pectoris: Secondary | ICD-10-CM | POA: Diagnosis not present

## 2016-03-26 MED ORDER — CLOPIDOGREL BISULFATE 75 MG PO TABS: 75.0000 mg | ORAL_TABLET | Freq: Every day | ORAL | 3 refills | Status: DC

## 2016-03-26 NOTE — Telephone Encounter (Signed)
Call patient: I sent her prescription for generic Plavix for her to take in place of her Effient. She can just switch. If for example if she took the Effient today than tomorrow she can just take the Plavix instead. If it's not covered by her insurance them please let me know.

## 2016-03-26 NOTE — Addendum Note (Signed)
Addended by: Beatrice Lecher D on: 03/26/2016 11:54 AM   Modules accepted: Orders

## 2016-03-26 NOTE — Telephone Encounter (Signed)
Called pt and advised of recommendations.Rachel Carr

## 2016-03-26 NOTE — Addendum Note (Signed)
Addended by: Teddy Spike on: 03/26/2016 11:36 AM   Modules accepted: Orders

## 2016-03-26 NOTE — Progress Notes (Addendum)
Subjective:    CC: Hosp F/U  HPI:  65 year old female with a history of GERD, and tobacco please went to the emergency department in [facility] complaining of chest pain that have been going on for about a week. She had actually originally presented to the ED on July 12 but decided to leave AMA. Her cardiac enzymes were normal. Her pro-beta peptide was just slightly elevated and d-dimer was normal. She was encouraged to be admitted but actually decided to leave AMA.She went back to the ED on July 18. She was admitted on July 18 for 2 days and discharged home on July 20. She did have a stress test performed which showed an ejection fraction of 82% with reversibility of the inferior wall near the base. Could not exclude ischemia. She then underwent PCI with DES to the right coronary artery. She was started on aspirin and Effient. She has a follow-up in 2-3 weeks at the [facility] clinic. Then a follow-up with Dr. Mauricio Po in 3 months. Now on aspirin, Lipitor, carvedilol, Effient and as needed nitroglycerin.    She says the Effient is quite expensive. She still taking some free samples that were given to her. Plus she says it makes her feel "sick". She denies any nausea/ vomiting or GI bleed.  Past medical history, Surgical history, Family history not pertinant except as noted below, Social history, Allergies, and medications have been entered into the medical record, reviewed, and corrections made.   Review of Systems: No fevers, chills, night sweats, weight loss, chest pain, or shortness of breath.   Objective:    General: Well Developed, well nourished, and in no acute distress.  Neuro: Alert and oriented x3, extra-ocular muscles intact, sensation grossly intact.  HEENT: Normocephalic, atraumatic  Skin: Warm and dry, no rashes. Cardiac: Regular rate and rhythm, no murmurs rubs or gallops, no lower extremity edema. No carotid bruits.  Respiratory: Clear to auscultation bilaterally. Not  using accessory muscles, speaking in full sentences. Ext: No LE edema.    Impression and Recommendations:   CAD - she is actually feeling much better overall. Will discontinue Effient and switch her to Plavix since the cost is going to be over $200 a month and she says she doesn't feel well on the Effient. She says it makes her feel "sick". We'll switch to generic Plavix. Explained her that she will need to take this for up to a year. We will also work on trying to get her back in with cardiology. She should've been contacted to follow-up in the clinic sometime next week but says she has not heard from their office and we will place referral and try to expedite that today. Make sure to continue with statin, aspirin, beta blocker. She has when necessary nitroglycerin as well. Blood pressure is well controlled as well. Strongly encouraged her to continue to work on healthy diet and getting regular exercise. She says she is Artie started making some changes. She has not picked up smoking cigarettes again but has started vaping again. Encouraged her to work on discontinuing that as well.

## 2016-03-27 ENCOUNTER — Other Ambulatory Visit: Payer: Self-pay | Admitting: *Deleted

## 2016-03-27 MED ORDER — PANTOPRAZOLE SODIUM 40 MG PO TBEC: 40.0000 mg | DELAYED_RELEASE_TABLET | Freq: Every day | ORAL | 6 refills | Status: DC

## 2016-03-31 ENCOUNTER — Encounter: Payer: Self-pay | Admitting: *Deleted

## 2016-09-11 ENCOUNTER — Other Ambulatory Visit: Payer: Self-pay | Admitting: Family Medicine

## 2016-09-29 ENCOUNTER — Ambulatory Visit (INDEPENDENT_AMBULATORY_CARE_PROVIDER_SITE_OTHER): Payer: Medicare HMO | Admitting: Family Medicine

## 2016-09-29 ENCOUNTER — Encounter: Payer: Self-pay | Admitting: Family Medicine

## 2016-09-29 VITALS — BP 109/64 | HR 71 | Ht 61.0 in | Wt 178.0 lb

## 2016-09-29 DIAGNOSIS — Z9861 Coronary angioplasty status: Secondary | ICD-10-CM

## 2016-09-29 DIAGNOSIS — R7301 Impaired fasting glucose: Secondary | ICD-10-CM | POA: Diagnosis not present

## 2016-09-29 DIAGNOSIS — K2270 Barrett's esophagus without dysplasia: Secondary | ICD-10-CM

## 2016-09-29 DIAGNOSIS — L821 Other seborrheic keratosis: Secondary | ICD-10-CM | POA: Diagnosis not present

## 2016-09-29 DIAGNOSIS — I2510 Atherosclerotic heart disease of native coronary artery without angina pectoris: Secondary | ICD-10-CM | POA: Diagnosis not present

## 2016-09-29 DIAGNOSIS — Z79899 Other long term (current) drug therapy: Secondary | ICD-10-CM | POA: Diagnosis not present

## 2016-09-29 DIAGNOSIS — L989 Disorder of the skin and subcutaneous tissue, unspecified: Secondary | ICD-10-CM

## 2016-09-29 LAB — POCT GLYCOSYLATED HEMOGLOBIN (HGB A1C): HEMOGLOBIN A1C: 5.8

## 2016-09-29 NOTE — Patient Instructions (Addendum)
Okay to get wet in the shower tomorrow morning. Do not scrub at the area. Pat dry and apply Vaseline daily for the next 2 weeks. Work on [employer] and regular exercise to get her A1c back down to where it was before.

## 2016-09-29 NOTE — Progress Notes (Signed)
Subjective:    CC: IFG   HPI:  IFG - No increased thirst urination. She admits that she ate a lot over the holidays and actually gained a few pounds.  Barrett's esophagus-currently on Protonix 40 mg daily and she's no longer on the Carafate.  Coronary artery disease-she is not had any recent chest pain or shortness of breath. She's been on a statin since then and has not had labs rechecked.  She would also like to look at her moles today. She has one particular on her right abdomen that has gotten larger over very short period of time. She says it started out as a small brown area and has gotten bigger. She denies any pain irritation.  Past medical history, Surgical history, Family history not pertinant except as noted below, Social history, Allergies, and medications have been entered into the medical record, reviewed, and corrections made.   Review of Systems: No fevers, chills, night sweats, weight loss, chest pain, or shortness of breath.   Objective:    General: Well Developed, well nourished, and in no acute distress.  Neuro: Alert and oriented x3, extra-ocular muscles intact, sensation grossly intact.  HEENT: Normocephalic, atraumatic  Skin: Warm and dry, no rashes. She has a large approximately 1-1/2 I 1 cm seborrheic keratosis on the right side of the abdomen. She has 2 slightly smaller lesion just above the strap over the mid back. And on her right shoulder she has a small approximately 2-3 mm hyperkeratotic lesion. Cardiac: Regular rate and rhythm, no murmurs rubs or gallops, no lower extremity edema.  Respiratory: Clear to auscultation bilaterally. Not using accessory muscles, speaking in full sentences.   Impression and Recommendations:    IFG - Hemoglobin A1c is up to 5.8 today which is a big jump for her. Discussed getting back on track with healthy diet and regular exercise. Follow back up in 6 months.  Barrett's esophagus-continue PPI. Off of Carafate saw removed from  medication list.  Seborrheic keratosis on her right abdomen and above her bra strap. Discussed treatment with cryotherapy. Patient would like to have this done and she says it does catch on her bra strap on her clothing.  Coronary artery disease status post stent-due to recheck lipid panel and liver enzymes.  Suspicious lesion on right shoulder-recommend shave biopsy for further evaluation. It's very keratotic on the surface which is concerning for possible squamous cell. Will call with biopsy results once available.  Shave Biopsy Procedure Note  Pre-operative Diagnosis: Suspicious lesion  Post-operative Diagnosis: normal  Locations:right shoudler  Indications: irritation  Anesthesia: shoulder without added sodium bicarbonate  Procedure Details  History of allergy to iodine: no  Patient informed of the risks (including bleeding and infection) and benefits of the  procedure and Verbal informed consent obtained.  The lesion and surrounding area were given a sterile prep using chlorhexidine and draped in the usual sterile fashion. A scalpel was used to shave an area of skin approximately 8mm by 66mm.  Hemostasis achieved with alumuninum chloride. Antibiotic ointment and a sterile dressing applied.  The specimen was sent for pathologic examination. The patient tolerated the procedure well.  EBL: 0 ml  Findings: await pathology  Condition: Stable  Complications: none.  Plan: 1. Instructed to keep the wound dry and covered for 24-48h and clean thereafter. 2. Warning signs of infection were reviewed.   3. Recommended that the patient use OTC acetaminophen as needed for pain.  4. Return PRN.   Cryotherapy Procedure Note  Pre-operative  Diagnosis: Seborrheic keratosis 3  Post-operative Diagnosis: same   Locations: right abdomen 2 mid back  Indications: irritation   Anesthesia: not required    Procedure Details  Patient informed of risks (permanent scarring, infection,  light or dark discoloration, bleeding, infection, weakness, numbness and recurrence of the lesion) and benefits of the procedure and verbal informed consent obtained.  The areas are treated with liquid nitrogen therapy, frozen until ice ball extended 2 mm beyond lesion, allowed to thaw, and treated again. The patient tolerated procedure well.  The patient was instructed on post-op care, warned that there may be blister formation, redness and pain. Recommend OTC analgesia as needed for pain.  Condition: Stable  Complications: none.  Plan: 1. Instructed to keep the area dry and covered for 24-48h and clean thereafter. 2. Warning signs of infection were reviewed.   3. Recommended that the patient use OTC acetaminophen as needed for pain.  4. Return PRN.

## 2016-10-01 LAB — COMPLETE METABOLIC PANEL WITH GFR
ALBUMIN: 4 g/dL (ref 3.6–5.1)
ALK PHOS: 94 U/L (ref 33–130)
ALT: 11 U/L (ref 6–29)
AST: 13 U/L (ref 10–35)
BUN: 18 mg/dL (ref 7–25)
CO2: 25 mmol/L (ref 20–31)
Calcium: 8.8 mg/dL (ref 8.6–10.4)
Chloride: 104 mmol/L (ref 98–110)
Creat: 0.79 mg/dL (ref 0.50–0.99)
GFR, Est African American: >89 mL/min (ref >=60)
GFR, Est Non African American: 79 mL/min (ref >=60)
GLUCOSE: 90 mg/dL (ref 65–99)
POTASSIUM: 4.6 mmol/L (ref 3.5–5.3)
SODIUM: 140 mmol/L (ref 135–146)
Total Bilirubin: 0.9 mg/dL (ref 0.2–1.2)
Total Protein: 6.4 g/dL (ref 6.1–8.1)

## 2016-10-01 LAB — LIPID PANEL
CHOL/HDL RATIO: 1.7 ratio (ref <5.0)
Cholesterol: 127 mg/dL (ref <200)
HDL: 76 mg/dL (ref >50)
LDL CALC: 38 mg/dL (ref <100)
Triglycerides: 65 mg/dL (ref <150)
VLDL: 13 mg/dL (ref <30)

## 2016-11-23 ENCOUNTER — Telehealth: Payer: Self-pay | Admitting: *Deleted

## 2016-11-23 NOTE — Telephone Encounter (Signed)
She really needs appt with [place]

## 2016-11-23 NOTE — Telephone Encounter (Signed)
Pt left vm wanting to know if you would be willing to send an abx in for her. She has a tooth abscess and is currently trying to find a dentist that accepts her ins.  She stated that she is in a lot of pain but doesn't want any pain meds.

## 2016-11-23 NOTE — Telephone Encounter (Signed)
LMOM notifying pt to schedule appointment.

## 2016-11-25 ENCOUNTER — Encounter: Payer: Self-pay | Admitting: Family Medicine

## 2016-11-25 ENCOUNTER — Ambulatory Visit (INDEPENDENT_AMBULATORY_CARE_PROVIDER_SITE_OTHER): Payer: Medicare HMO | Admitting: Family Medicine

## 2016-11-25 VITALS — BP 113/75 | HR 68 | Ht 61.0 in | Wt 179.0 lb

## 2016-11-25 DIAGNOSIS — K047 Periapical abscess without sinus: Secondary | ICD-10-CM | POA: Diagnosis not present

## 2016-11-25 MED ORDER — AMOXICILLIN-POT CLAVULANATE 875-125 MG PO TABS: 1.0000 | ORAL_TABLET | Freq: Two times a day (BID) | ORAL | 0 refills | Status: DC

## 2016-11-25 NOTE — Progress Notes (Signed)
Subjective:    Patient ID: Rachel Carr, female    DOB: 02/09/1951, 66 y.o.   MRN: [id number]  HPI   66 year old female comes in today because she has some significant swelling over her lower jaw at the third tooth from the middle on the left side. She has been swelling for about 5 days infection can't to the office requesting that we call in something that we encouraged her to come in just to make sure that she didn't have any type of extended infection into the jaw etc. She's been using ibuprofen and trying to do some warm salt water gargles. No other medications for pain. She says it was extremely painful to put her partial plate in which fits over the tooth just above the jaw line. She said she does have dental insurance and try: Several practices here locally but they unfortunately will not take her insurance without her paying upfront. No fevers chills or sweats. She denies any pus or drainage from the gum line.   Review of Systems   BP 113/75   Pulse 68   Ht 5\' 1"  (1.549 m)   Wt 179 lb (81.2 kg)   SpO2 100%   BMI 33.82 kg/m     No Known Allergies  Past Medical History:  Diagnosis Date  . Barrett's syndrome   . Esophageal stricture   . Esophageal ulcer 09-2010   gastritis Dr. Glennon Hamilton  . Gastric polyps    colonoscopy 2012(due again for repeat 2017)  . Shingles    on the right eye  . Shingles     Past Surgical History:  Procedure Laterality Date  . ABDOMINAL HYSTERECTOMY     endometriosis  . CARPAL TUNNEL RELEASE    . CHOLECYSTECTOMY    . NISSEN FUNDOPLICATION  4765  . RADIOFREQUENCY ABLATION    . RT knee surgery      Social History   Social History  . Marital status: Married    Spouse name: N/A  . Number of children: N/A  . Years of education: N/A   Occupational History  . Not on file.   Social History Main Topics  . Smoking status: Current Every Day Smoker    Packs/day: 0.50    Years: 20.00  . Smokeless tobacco: Never Used     Comment: uses vape   . Alcohol use No  . Drug use: No  . 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   Other Topics Concern  . Not on file   Social History Narrative  . No narrative on file    Family History  Problem Relation Age of Onset  . Heart disease Mother   . Diabetes Father   . Cancer Father     prostrate  . Cancer Sister     esophagus  . Diabetes Sister   . Cancer Brother     kidney    Outpatient Encounter Prescriptions as of 11/25/2016  Medication Sig  . AMBULATORY NON FORMULARY MEDICATION Take 2 tablets by mouth as needed. Medication Name: Prunelax  . amoxicillin-clavulanate (AUGMENTIN) 875-125 MG tablet Take 1 tablet by mouth 2 (two) times daily.  Marland Kitchen atorvastatin (LIPITOR) 80 MG tablet TAKE ONE TABLET (80 MG TOTAL) BY MOUTH AT BEDTIME.  . carvedilol (COREG) 3.125 MG tablet TAKE ONE TABLET (3.125 MG TOTAL) BY MOUTH 2 (TWO) TIMES DAILY.  Marland Kitchen clopidogrel (PLAVIX) 75 MG tablet Take 1 tablet (75 mg total) by mouth daily.  Marland Kitchen  [facility] CH[facility] ASPIRIN 81 MG chewable tablet Chew 1 tablet by mouth.  . fluticasone (FLONASE) 50 MCG/ACT nasal spray Place 2 sprays into both nostrils daily.  . nitroGLYCERIN (NITROSTAT) 0.4 MG SL tablet PLACE ONE TABLET (0.4 MG TOTAL) UNDER THE TONGUE EVERY 5 (FIVE) MINUTES AS NEEDED FOR CHEST PAIN.  Marland Kitchen pantoprazole (PROTONIX) 40 MG tablet TAKE 1 TABLET (40 MG TOTAL) BY MOUTH DAILY.   No facility-administered encounter medications on file as of 11/25/2016.           Objective:   Physical Exam  Constitutional: She is oriented to person, place, and time. She appears well-developed and well-nourished.  HENT:  Head: Normocephalic and atraumatic.  Right Ear: External ear normal.  Left Ear: External ear normal.  Nose: Nose normal.  Her mouth does appear a little dry. She does have her partial plate and. Where she actually still has a tooth in place which is the third on the bottom Road to the left. There is significant swelling in almost  a white discoloration of the gum around the baseline of the tooth. It is tender to palpation. I was not able to express any pus. The tooth itself is very tender. Nontender along the lower jaw line. She does have a small right anterior cervical lymph node that is mildly swollen.  Eyes: Conjunctivae and EOM are normal.  Neck: Neck supple. No thyromegaly present.  Cardiovascular: Normal rate.   Pulmonary/Chest: Effort normal.  Lymphadenopathy:    She has cervical adenopathy.  Neurological: She is alert and oriented to person, place, and time.  Skin: Skin is dry. No pallor.  Psychiatric: She has a normal mood and affect. Her behavior is normal.  Vitals reviewed.         Assessment & Plan:  Abscessed tooth with infected gum-don't see any extension towards the jaw. No facial swelling and was at this point. Undergo ahead and place her on Augmentin but explained that this has only temporary. Ultimately she needs to have the area dressed. She does not remember any injury to the gum line. We will also help try to call rounds if we can find someone else take her insurance. Also encouraged her to call the insurance company directly to see if they have a list of providers who are in network for her. She said she asked to called on Tuesday multiple times and says never got any went into the phone and left multiple messages and has not heard back from them.

## 2016-11-26 ENCOUNTER — Emergency Department (INDEPENDENT_AMBULATORY_CARE_PROVIDER_SITE_OTHER): Payer: Medicare HMO

## 2016-11-26 ENCOUNTER — Encounter: Payer: Self-pay | Admitting: Emergency Medicine

## 2016-11-26 ENCOUNTER — Emergency Department
Admission: EM | Admit: 2016-11-26 | Discharge: 2016-11-26 | Disposition: A | Discharge status: Home / Self Care | Payer: Medicare HMO | Source: Home / Self Care | Attending: Family Medicine | Admitting: Family Medicine

## 2016-11-26 DIAGNOSIS — M2342 Loose body in knee, left knee: Secondary | ICD-10-CM

## 2016-11-26 DIAGNOSIS — S8992XA Unspecified injury of left lower leg, initial encounter: Secondary | ICD-10-CM

## 2016-11-26 DIAGNOSIS — R936 Abnormal findings on diagnostic imaging of limbs: Secondary | ICD-10-CM | POA: Diagnosis not present

## 2016-11-26 DIAGNOSIS — M1712 Unilateral primary osteoarthritis, left knee: Secondary | ICD-10-CM

## 2016-11-26 DIAGNOSIS — M7122 Synovial cyst of popliteal space [Baker], left knee: Secondary | ICD-10-CM | POA: Diagnosis not present

## 2016-11-26 DIAGNOSIS — M25562 Pain in left knee: Secondary | ICD-10-CM

## 2016-11-26 DIAGNOSIS — S82142A Displaced bicondylar fracture of left tibia, initial encounter for closed fracture: Secondary | ICD-10-CM

## 2016-11-26 DIAGNOSIS — M170 Bilateral primary osteoarthritis of knee: Secondary | ICD-10-CM

## 2016-11-26 MED ORDER — IBUPROFEN 600 MG PO TABS
600.0000 mg | ORAL_TABLET | Freq: Once | ORAL | Status: AC
  Administered 2016-11-26: 600 mg via ORAL

## 2016-11-26 NOTE — ED Triage Notes (Signed)
Patient's left knee locked during exit of shower last night; now very painful when attempting to bend. No OTCs.

## 2016-11-26 NOTE — Discharge Instructions (Signed)
Home care instructions as per Dr. Aundria Mems

## 2016-11-26 NOTE — Consult Note (Signed)
   Subjective:    I'm seeing this patient as a consultation for:  Dr. Theone Murdoch  CC: Left knee injury  HPI: Earlier today this pleasant 66 year old female took misstep, and fell out of the shower. She had immediate pain, swelling, she was able to minimally bear weight. She was seen in urgent care where x-rays showed what appeared to be a lateral tibial plateau fracture. Pain is moderate, persistent, localized laterally without radiation with severe swelling.  Past medical history:  Negative.  See flowsheet/record as well for more information.  Surgical history: Negative.  See flowsheet/record as well for more information.  Family history: Negative.  See flowsheet/record as well for more information.  Social history: Negative.  See flowsheet/record as well for more information.  Allergies, and medications have been entered into the medical record, reviewed, and no changes needed.   Review of Systems: No headache, visual changes, nausea, vomiting, diarrhea, constipation, dizziness, abdominal pain, skin rash, fevers, chills, night sweats, weight loss, swollen lymph nodes, body aches, joint swelling, muscle aches, chest pain, shortness of breath, mood changes, visual or auditory hallucinations.   Objective:   General: Well Developed, well nourished, and in no acute distress.  Neuro/Psych: Alert and oriented x3, extra-ocular muscles intact, able to move all 4 extremities, sensation grossly intact. Skin: Warm and dry, no rashes noted.  Respiratory: Not using accessory muscles, speaking in full sentences, trachea midline.  Cardiovascular: Pulses palpable, no extremity edema. Abdomen: Does not appear distended. Left Knee: Swollen, pain with terminal flexion, visible effusion with palpable fluid wave.  ROM normal in flexion and extension and lower leg rotation. Ligaments with solid consistent endpoints including ACL, PCL, LCL, MCL. Negative Mcmurray's and provocative meniscal tests. Non  painful patellar compression. Patellar and quadriceps tendons unremarkable. Hamstring and quadriceps strength is normal.  X-rays concerning for tibial plateau fracture, however clinical picture is not suggestive.  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  Procedure: Real-time Ultrasound Guided aspiration/injection of left knee Device: GE Logiq E  Verbal informed consent obtained.  Time-out conducted.  Noted no overlying erythema, induration, or other signs of local infection.  Skin prepped in a sterile fashion.  Local anesthesia: Topical Ethyl chloride.  With sterile technique and under real time ultrasound guidance:  Aspirated 20 mL straw-colored fluid, syringe switched and 1 mL kenalog 40, 2 mL bupivacaine, 2 mL lidocaine injected easily. Completed without difficulty  Pain immediately resolved suggesting accurate placement of the medication.  Advised to call if fevers/chills, erythema, induration, drainage, or persistent bleeding.  Images permanently stored and available for review in the ultrasound unit.  Impression: Technically successful ultrasound guided injection.  Impression and Recommendations:   This case required medical decision making of moderate complexity.  Knee injury: Question whether this is a true tibial plateau fracture, I am going to get a CT of the knee without contrast.  CT negative for fracture, only shows osteoarthritis with an intra-articular loose body, injection as above, return to see me in 4 weeks, I'm adding formal physical therapy in the meantime.  ___________________________________________ Gwen Her. Dianah Field, M.D., ABFM., CAQSM. Primary Care and [place] In[employer] of [place] [employer] [facility]

## 2016-11-26 NOTE — ED Provider Notes (Signed)
[facility]    CSN: [id number] Arrival date & time: 11/26/16  1121     History   Chief Complaint Chief Complaint  Patient presents with  . Knee Pain    left    HPI Rachel Carr is a 66 y.o. female.   While patient was stepping out of her shower yesterday, her left knee suddenly locked and has been persistently painful when she tries flex it and bear weight.   The history is provided by the patient.  Knee Pain  Location:  Knee Time since incident:  1 day Injury: no   Knee location:  L knee Pain details:    Quality:  Aching   Radiates to:  Does not radiate   Severity:  Severe   Onset quality:  Sudden   Duration:  1 day   Timing:  Constant   Progression:  Unchanged Chronicity:  New Dislocation: no   Foreign body present:  No foreign bodies Prior injury to area:  No Relieved by:  Nothing Worsened by:  Bearing weight and flexion Ineffective treatments:  NSAIDs Associated symptoms: decreased ROM, stiffness and swelling   Associated symptoms: no back pain, no fever, no muscle weakness and no numbness     Past Medical History:  Diagnosis Date  . Barrett's syndrome   . Esophageal stricture   . Esophageal ulcer 09-2010   gastritis Dr. Glennon Hamilton  . Gastric polyps    colonoscopy 2012(due again for repeat 2017)  . Shingles    on the right eye  . Shingles     Patient Active Problem List   Diagnosis Date Noted  . Left knee injury 11/26/2016  . CAD S/P percutaneous coronary angioplasty 09/29/2016  . IFG (impaired fasting glucose) 11/20/2015  . Obese 10/14/2011  . Barrett's esophagus 01/13/2011  . Tobacco use disorder 01/02/2011  . COLONIC POLYPS 10/10/2010  . ULCER OF ESOPHAGUS WITHOUT BLEEDING 10/10/2010  . GERD 08/15/2010  . 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 ABDOMINAL HYSTERECTOMY     endometriosis  .  CARPAL TUNNEL RELEASE    . CHOLECYSTECTOMY    . NISSEN FUNDOPLICATION  6789  . RADIOFREQUENCY ABLATION    . RT knee surgery      OB History    No data available       Home Medications    Prior to Admission medications   Medication Sig Start Date End Date Taking? Authorizing Provider  AMBULATORY NON FORMULARY MEDICATION Take 2 tablets by mouth as needed. Medication Name: Prunelax    Historical Provider, MD  amoxicillin-clavulanate (AUGMENTIN) 875-125 MG tablet Take 1 tablet by mouth 2 (two) times daily. 11/25/16   Hali Marry, MD  atorvastatin (LIPITOR) 80 MG tablet TAKE ONE TABLET (80 MG TOTAL) BY MOUTH AT BEDTIME. 02/28/16   Historical Provider, MD  carvedilol (COREG) 3.125 MG tablet TAKE ONE TABLET (3.125 MG TOTAL) BY MOUTH 2 (TWO) TIMES DAILY. 02/28/16   Historical Provider, MD  clopidogrel (PLAVIX) 75 MG tablet Take 1 tablet (75 mg total) by mouth daily. 03/26/16   Hali Marry, MD  [facility] ASPIRIN 81 MG chewable tablet Chew 1 tablet by mouth. 02/28/16   Historical Provider, MD  fluticasone (FLONASE) 50 MCG/ACT nasal spray Place 2 sprays into both nostrils daily. 05/10/15   Emeterio Reeve, DO  nitroGLYCERIN (NITROSTAT) 0.4 MG SL tablet PLACE ONE TABLET (0.4 MG TOTAL)  UNDER THE TONGUE EVERY 5 (FIVE) MINUTES AS NEEDED FOR CHEST PAIN. 02/28/16   Historical Provider, MD  pantoprazole (PROTONIX) 40 MG tablet TAKE 1 TABLET (40 MG TOTAL) BY MOUTH DAILY. 09/11/16   Hali Marry, MD    Family History Family History  Problem Relation Age of Onset  . Heart disease Mother   . Diabetes Father   . Cancer Father     prostrate  . Cancer Sister     esophagus  . Diabetes Sister   . Cancer Brother     kidney    Social History Social History  Substance Use Topics  . Smoking status: Current Every Day Smoker    Packs/day: 0.50    Years: 20.00  . Smokeless tobacco: Never Used     Comment: uses vape  . Alcohol use No     Allergies   Patient has no known  allergies.   Review of Systems Review of Systems  Constitutional: Negative for fever.  Musculoskeletal: Positive for stiffness. Negative for back pain.  All other systems reviewed and are negative.    Physical Exam Triage Vital Signs ED Triage Vitals  Enc Vitals Group     BP 11/26/16 1152 104/69     Pulse Rate 11/26/16 1152 73     Resp 11/26/16 1152 16     Temp 11/26/16 1152 98.4 F (36.9 C)     Temp Source 11/26/16 1152 Oral     SpO2 11/26/16 1152 96 %     Weight 11/26/16 1154 176 lb (79.8 kg)     Height 11/26/16 1154 5' 1.5" (1.562 m)     Head Circumference --      Peak Flow --      Pain Score 11/26/16 1154 8     Pain Loc --      Pain Edu? --      Excl. in [place]? --    No data found.   Updated Vital Signs BP 104/69 (BP Location: Left Arm)   Pulse 73   Temp 98.4 F (36.9 C) (Oral)   Resp 16   Ht 5' 1.5" (1.562 m)   Wt 176 lb (79.8 kg)   SpO2 96%   BMI 32.72 kg/m   Visual Acuity Right Eye Distance:   Left Eye Distance:   Bilateral Distance:    Right Eye Near:   Left Eye Near:    Bilateral Near:     Physical Exam  Constitutional: She appears well-developed and well-nourished. No distress.  HENT:  Head: Atraumatic.  Eyes: Pupils are equal, round, and reactive to light.  Cardiovascular: Normal rate.   Pulmonary/Chest: Effort normal.  Musculoskeletal:       Left knee: She exhibits decreased range of motion, swelling and effusion. She exhibits no ecchymosis, no deformity, no laceration and no erythema. Tenderness found. Lateral joint line tenderness noted.       Legs: Patient unable to actively or passively flex her left knee.  Tenderness to palpation primarily laterally.  Distal neurovascular function is intact.   Neurological: She is alert.  Skin: Skin is warm and dry.  Nursing note and vitals reviewed.    UC Treatments / Results  Labs (all labs ordered are listed, but only abnormal results are displayed) Labs Reviewed - No data to display  EKG   EKG Interpretation None       Radiology Ct Knee Left Wo Contrast  Result Date: 11/26/2016 CLINICAL DATA:  Left knee injury getting out of the shower last  night with onset of pain. Initial encounter. EXAM: CT OF THE LEFT KNEE WITHOUT CONTRAST TECHNIQUE: Multidetector CT imaging of the left knee was performed according to the standard protocol. Multiplanar CT image reconstructions were also generated. COMPARISON:  Plain films left knee 11/26/2016. FINDINGS: Bones/Joint/Cartilage There is no fracture. Possible lateral tibial plateau fracture seen on prior examination is due to osteophytic ridge along the anterior periphery of the lateral tibial plateau. Bulky tricompartmental osteophytosis is worst medially. There is near bone-on-bone joint space narrowing in the medial compartment. Ligaments Suboptimally assessed by CT.  Appear intact. Muscles and Tendons Intact. Soft tissues Moderate joint effusion is seen. A 0.8 cm in diameter loose body is seen in the effusion along the distal aspect of the lateral femur. Baker cyst measuring 1.8 cm AP x 1.8 cm transverse x 4.3 cm craniocaudal is noted. IMPRESSION: Negative for fracture or other acute abnormality. Osteoarthritis about the knee appears markedly advanced for age and worst in the medial compartment where it is severe. 0.8 cm in diameter loose body in the patellofemoral joint effusion is noted. Baker's cyst. Electronically Signed   By: Inge Rise M.D.   On: 11/26/2016 13:05   Dg Knee Complete 4 Views Left  Result Date: 11/26/2016 CLINICAL DATA:  Left lateral and anterior knee pain with inability to fully extend. Onset of symptoms last night EXAM: LEFT KNEE - COMPLETE 4+ VIEW COMPARISON:  Left knee radiograph of Jan 01, 2009 FINDINGS: The bones are subjectively adequately mineralized. There is marked irregularity of the articular surfaces of the medial femoral condyles and the medial tibial plateau. Mild irregularity of the lateral tibial plateau is  observed. There is cortical lucency along the articular margin of the lateral tibial plateau that may reflect a mildly depressed tibial plateau fracture. There is moderate narrowing of the medial joint compartment. There are large spurs arising from the superior articular margin of the patella. There is a small suprapatellar effusion. IMPRESSION: Moderate to severe osteoarthritic change of the medial joint compartment. There are moderate osteoarthritic changes of the patellofemoral compartment. Irregularity of the margin of the lateral tibial plateau suspicious for mildly depressed fracture. The age of this is unclear. Immobilization and nonweightbearing is recommended until MRI can be performed. These results will be called to the ordering clinician or representative by the Radiologist Assistant, and communication documented in the PACS or zVision Dashboard. Electronically Signed   By: David  Martinique M.D.   On: 11/26/2016 12:10    Procedures Procedures (including critical care time)  Medications Ordered in UC Medications  ibuprofen (ADVIL,MOTRIN) tablet 600 mg (600 mg Oral Given 11/26/16 1334)     Initial Impression / Assessment and Plan / UC Course  I have reviewed the triage vital signs and the nursing notes.  Pertinent labs & imaging results that were available during my care of the patient were reviewed by me and considered in my medical decision making (see chart for details).    ?fracture left tibial plateau. Will refer to Dr. Aundria Mems for further evaluation and management.  Final Clinical Impressions(s) / UC Diagnoses   Final diagnoses:  Acute pain of left knee    New Prescriptions Discharge Medication List as of 11/26/2016  1:41 PM       Kandra Nicolas, MD 11/26/16 1539

## 2016-12-02 ENCOUNTER — Ambulatory Visit (INDEPENDENT_AMBULATORY_CARE_PROVIDER_SITE_OTHER): Payer: Medicare HMO | Admitting: Physical Therapy

## 2016-12-02 DIAGNOSIS — R2689 Other abnormalities of gait and mobility: Secondary | ICD-10-CM | POA: Diagnosis not present

## 2016-12-02 DIAGNOSIS — M25561 Pain in right knee: Secondary | ICD-10-CM

## 2016-12-02 DIAGNOSIS — M6281 Muscle weakness (generalized): Secondary | ICD-10-CM | POA: Diagnosis not present

## 2016-12-02 DIAGNOSIS — M25562 Pain in left knee: Secondary | ICD-10-CM | POA: Diagnosis not present

## 2016-12-02 NOTE — Patient Instructions (Signed)
Abductor Strength: Bridge Pose (Strap)    Make strap wide enough to brace knees at hip width. Press into strap with knees. Hold for _5___ seconds. Repeat __10__ times.  Copyright  VHI. All rights reserved.    Strengthening: Hip Abductor - Resisted    With band looped around both legs above knees, push thighs apart. Repeat __10-15__ times per set. Do _1___ sets per session. Do __1__ sessions per day.  http://orth.exer.us/689   Copyright  VHI. All rights reserved.    PLACE BAND AROUND ANKLES FOR NEXT 2 EXERCISES:  ABDUCTION: Standing - Resistance Band (Active)   Stand, feet flat. Against green resistance band, lift right leg out to side.  Repeat with other leg. Complete __1_ sets of __10-15_ repetitions. Perform _1__ sessions per day.    Strengthening: Hip Extension - Resisted   With tubing around both ankles, face anchor and pull leg straight back. Repeat __10-15__ times per set. Do ___1_ sets per session. Do __1__ sessions per day.     Cardio Equipment (Goal is 20-30 minutes)  1. Recumbent Bike  2. Seated Stepper  3. Treadmill  4. Elliptical   Machines for Strengthening (Goal is 2-3 sets of 10-15 reps)  1. Knee Extension  2. Hamstring Curls  3. Hip Abduction  4. Hip Adduction  5.  Leg Press  6. Hip Extension (may not have - that's okay!)

## 2016-12-02 NOTE — Therapy (Addendum)
[facility] [place], [place], [address] Phone: [phone]   Fax:  [phone]  Physical Therapy Evaluation  Patient Details  Name: Rachel Carr MRN: [id number] Date of Birth: 1951/02/20 Referring Provider: Silverio Decamp, MD  Encounter Date: 12/02/2016      PT End of Session - 12/02/16 1506    Visit Number 1   Number of Visits 6   Date for PT Re-Evaluation 01/13/17   Authorization Type [employer]   PT Start Time 1415   PT Stop Time 1500   PT Time Calculation (min) 45 min   Activity Tolerance Patient tolerated treatment well   Behavior During Therapy [facility] for tasks assessed/performed      Past Medical History:  Diagnosis Date  . Barrett's syndrome   . Esophageal stricture   . Esophageal ulcer 09-2010   gastritis Dr. Glennon Hamilton  . Gastric polyps    colonoscopy 2012(due again for repeat 2017)  . Shingles    on the right eye  . Shingles     Past Surgical History:  Procedure Laterality Date  . ABDOMINAL HYSTERECTOMY     endometriosis  . CARPAL TUNNEL RELEASE    . CHOLECYSTECTOMY    . NISSEN FUNDOPLICATION  3710  . RADIOFREQUENCY ABLATION    . RT knee surgery      There were no vitals filed for this visit.       Subjective Assessment - 12/02/16 1414    Subjective Pt is a 66 y/o female who presents to OPPT for Lt knee pain.  Pt reports on 11/25/16 Lt knee "locked up" when trying to step out of shower.  Pt went to MD, and has OA in bil knees.  Pt also had fluid aspirated and injection to Lt knee with instant improvement in pain.  Pt presents today without pain in Lt knee, but pain still in Rt knee (plans to have aspiration of Rt knee as well).    Pertinent History Rt knee arthroscopic surgery.   Limitations Walking   How long can you stand comfortably? "weakness" sensation with initial standing   How long can you walk comfortably? depends on weather   Patient Stated Goals "I'm  confused about why I'm here."     Currently in Pain? Yes   Pain Score 8   at best 4/10   Pain Location Knee   Pain Orientation Right  no pain in Lt knee   Pain Descriptors / Indicators Aching   Pain Type Chronic pain   Pain Onset More than a month ago   Pain Frequency Intermittent   Aggravating Factors  weather, stairs   Pain Relieving Factors good weather, rest            [facility] PT Assessment - 12/02/16 1421      Assessment   Medical Diagnosis Lt knee pain   Referring Provider Silverio Decamp, MD   Onset Date/Surgical Date 11/25/16   Next MD Visit 3 weeks   Prior Therapy none     Precautions   Precautions None     Restrictions   Weight Bearing Restrictions No     Balance Screen   Has the patient fallen in the past 6 months No   Has the patient had a decrease in activity level because of a fear of falling?  No   Is the patient reluctant to leave their home because of a fear of falling?  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)   Leisure travel to [employer], walk on treadmill (20 min), gardening, ride 4 wheelers     Cognition   Overall Cognitive Status Within Functional Limits for tasks assessed     Observation/Other Assessments   Focus on Therapeutic Outcomes (FOTO)  48 (52% limited; predicted 43% limited)     AROM   Overall AROM Comments discomfort in Rt knee with all ROM testing   AROM Assessment Site Knee   Right/Left Knee Right;Left   Right Knee Extension 4  with quad set: 0   Right Knee Flexion 91   Left Knee Extension 0    Left Knee Flexion 110     PROM   PROM Assessment Site Knee   Right/Left Knee Right   Right Knee Flexion 95     Strength   Strength Assessment Site Hip;Knee   Right/Left Hip Right;Left   Right Hip Flexion 4/5   Right Hip Extension 4/5   Right Hip ABduction 3+/5   Right Hip ADduction 4-/5   Left Hip Flexion 3+/5   Left Hip Extension 3+/5   Left Hip ABduction 3/5   Left Hip ADduction 4/5   Right/Left Knee Right;Left   Right Knee Flexion 3/5  with pain   Right Knee Extension 5/5   Left Knee Flexion 4/5   Left Knee Extension 5/5     Ambulation/Gait   Gait Comments mild antalgic gait with decreased stance on RLE      Educated and performed on the following home exercises: Abductor Strength: Bridge Pose (Strap)    Make strap wide enough to brace knees at hip width. Press into strap with knees. Hold for _5___ seconds. Repeat __10__ times.  Copyright  VHI. All rights reserved.    Strengthening: Hip Abductor - Resisted    With band looped around both legs above knees, push thighs apart. Repeat __10-15__ times per set. Do _1___ sets per session. Do __1__ sessions per day.  http://orth.exer.us/689   Copyright  VHI. All rights reserved.    PLACE BAND AROUND ANKLES FOR NEXT 2 EXERCISES:  ABDUCTION: Standing - Resistance Band (Active)   Stand, feet flat. Against green resistance band, lift right leg out to side.  Repeat with other leg. Complete __1_ sets of __10-15_ repetitions. Perform _1__ sessions per day.    Strengthening: Hip Extension - Resisted   With tubing around both ankles, face anchor and pull leg straight back. Repeat __10-15__ times per set. Do ___1_ sets per session. Do __1__ sessions per day.                     PT Education - 12/02/16 1506    Education provided Yes   Education Details HEP, gym program, Silver Sneakers, aquatic program   Person(s) Educated Patient   Methods Explanation;Demonstration;Handout   Comprehension  Verbalized understanding;Returned demonstration             PT Long Term Goals - 12/02/16 1510      PT LONG TERM GOAL #1   Title independent  with HEP (01/13/17)   Time 6   Period Weeks   Status New     PT LONG TERM GOAL #2   Title report pain < 4/10 in bil knees for improved function and mobility (01/13/17)   Time 6   Period Weeks   Status New               Plan - 12/02/16 1507    Clinical Impression Statement Pt is a 66 y/o female who presents to OPPT for low complexity eval of knee pain.  Pt's referral is for left knee pain which is now resolved with injection and aspiration of fluid, and now c/o increased Rt knee pain, likely due to OA and compensation for Lt knee injury last week.  Pt demonstrates decreased strength and mild decreased ROM of Rt knee.  Pt unable to attend PT due to finances and therefore will see PRN 1x/wk.  Pt to call if she needs to follow up with [place].  HEP and community fitness program issues to pt today.   Rehab Potential Good   PT Frequency 1x / week  PRN due to finances   PT Duration 6 weeks   PT Treatment/Interventions ADLs/Self Care Home Management;Cryotherapy;Presenter, broadcasting;Therapeutic exercise;Therapeutic activities;Functional mobility training;Stair training;Gait training;Patient/family education;Manual techniques;Passive range of motion;Vasopneumatic Device;Taping   PT Next Visit Plan review HEP, progress PRN, modalities PRN; will need new gcode if pt returns   Consulted and Agree with Plan of Care Patient      Patient will benefit from skilled therapeutic intervention in order to improve the following deficits and impairments:  Abnormal gait, Decreased range of motion, Decreased strength, Pain, Decreased mobility  Visit Diagnosis: Acute pain of left knee - Plan: PT plan of care cert/re-cert  Acute pain of right knee - Plan: PT plan of care cert/re-cert  Muscle weakness (generalized) - Plan:  PT plan of care cert/re-cert  Other abnormalities of gait and mobility - Plan: PT plan of care cert/re-cert     Problem List Patient Active Problem List   Diagnosis Date Noted  . Left knee injury 11/26/2016  . CAD S/P percutaneous coronary angioplasty 09/29/2016  . IFG (impaired fasting glucose) 11/20/2015  . Obese 10/14/2011  . Barrett's esophagus 01/13/2011  . Tobacco use disorder 01/02/2011  . COLONIC POLYPS 10/10/2010  . ULCER OF ESOPHAGUS WITHOUT BLEEDING 10/10/2010  . GERD 08/15/2010  . 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 Patient is being discharged due to not returning since the last visit.  ?????    Jeral Pinch, PT 01/15/17 8:41 AM

## 2016-12-10 ENCOUNTER — Other Ambulatory Visit: Payer: Self-pay | Admitting: Family Medicine

## 2016-12-16 ENCOUNTER — Ambulatory Visit (INDEPENDENT_AMBULATORY_CARE_PROVIDER_SITE_OTHER): Payer: Medicare HMO | Admitting: Sports Medicine

## 2016-12-16 DIAGNOSIS — M170 Bilateral primary osteoarthritis of knee: Secondary | ICD-10-CM

## 2016-12-16 NOTE — Progress Notes (Signed)
  Subjective:    CC: Follow-up  HPI: 3 weeks ago in urgent care this pleasant 66 year old female had a left knee injury, we drained and injected it, after CT showed no evidence of fracture, she did extremely well and is pain-free. She has similar pain in her right knee and desires interventional treatment here as well. She is also lost some weight with exercise and has been able to do a session of physical therapy.  Past medical history:  Negative.  See flowsheet/record as well for more information.  Surgical history: Negative.  See flowsheet/record as well for more information.  Family history: Negative.  See flowsheet/record as well for more information.  Social history: Negative.  See flowsheet/record as well for more information.  Allergies, and medications have been entered into the medical record, reviewed, and no changes needed.   Review of Systems: No fevers, chills, night sweats, weight loss, chest pain, or shortness of breath.   Objective:    General: Well Developed, well nourished, and in no acute distress.  Neuro: Alert and oriented x3, extra-ocular muscles intact, sensation grossly intact.  HEENT: Normocephalic, atraumatic, pupils equal round reactive to light, neck supple, no masses, no lymphadenopathy, thyroid nonpalpable.  Skin: Warm and dry, no rashes. Cardiac: Regular rate and rhythm, no murmurs rubs or gallops, no lower extremity edema.  Respiratory: Clear to auscultation bilaterally. Not using accessory muscles, speaking in full sentences. Right Knee: Normal to inspection with no erythema or effusion or obvious bony abnormalities. Tender to palpation at the medial joint line ROM normal in flexion and extension and lower leg rotation. Ligaments with solid consistent endpoints including ACL, PCL, LCL, MCL. Negative Mcmurray's and provocative meniscal tests. Non painful patellar compression. Patellar and quadriceps tendons unremarkable. Hamstring and quadriceps  strength is normal.  Procedure: Real-time Ultrasound Guided Injection of right knee Device: GE Logiq E  Verbal informed consent obtained.  Time-out conducted.  Noted no overlying erythema, induration, or other signs of local infection.  Skin prepped in a sterile fashion.  Local anesthesia: Topical Ethyl chloride.  With sterile technique and under real time ultrasound guidance:  1 mL kenalog 40, 2 mL lidocaine, 2 mL bupivacaine injected easily into the suprapatellar recess. Completed without difficulty  Pain immediately resolved suggesting accurate placement of the medication.  Advised to call if fevers/chills, erythema, induration, drainage, or persistent bleeding.  Images permanently stored and available for review in the ultrasound unit.  Impression: Technically successful ultrasound guided injection.  Impression and Recommendations:    Primary osteoarthritis of both knees 3 weeks ago I aspirated and injected her left knee, which did extremely well and she is pain-free. She does have multiple intra-articular loose bodies, if the injection does well she will not need arthroscopic removal. Having pain similar in the right knee, this knee was injected today. Return in one month.

## 2016-12-16 NOTE — Assessment & Plan Note (Addendum)
3 weeks ago I aspirated and injected her left knee, which did extremely well and she is pain-free. She does have multiple intra-articular loose bodies, if the injection does well she will not need arthroscopic removal. Having pain similar in the right knee, this knee was injected today. Return in one month.

## 2016-12-30 DIAGNOSIS — Z789 Other specified health status: Secondary | ICD-10-CM | POA: Diagnosis not present

## 2016-12-30 DIAGNOSIS — M25561 Pain in right knee: Secondary | ICD-10-CM | POA: Diagnosis not present

## 2016-12-30 DIAGNOSIS — M170 Bilateral primary osteoarthritis of knee: Secondary | ICD-10-CM | POA: Diagnosis not present

## 2016-12-30 DIAGNOSIS — M25562 Pain in left knee: Secondary | ICD-10-CM | POA: Diagnosis not present

## 2017-01-01 ENCOUNTER — Emergency Department
Admission: EM | Admit: 2017-01-01 | Discharge: 2017-01-01 | Disposition: A | Discharge status: Home / Self Care | Payer: Medicare HMO | Source: Home / Self Care | Attending: Family Medicine | Admitting: Family Medicine

## 2017-01-01 ENCOUNTER — Encounter: Payer: Self-pay | Admitting: *Deleted

## 2017-01-01 DIAGNOSIS — J302 Other seasonal allergic rhinitis: Secondary | ICD-10-CM

## 2017-01-01 DIAGNOSIS — J069 Acute upper respiratory infection, unspecified: Secondary | ICD-10-CM

## 2017-01-01 MED ORDER — AMOXICILLIN 875 MG PO TABS: 875.0000 mg | ORAL_TABLET | Freq: Two times a day (BID) | ORAL | 0 refills | Status: DC

## 2017-01-01 MED ORDER — PREDNISONE 20 MG PO TABS: ORAL_TABLET | ORAL | 0 refills | Status: DC

## 2017-01-01 NOTE — ED Provider Notes (Signed)
[facility]    CSN: [id number] Arrival date & time: 01/01/17  1047     History   Chief Complaint Chief Complaint  Patient presents with  . Sinus Problem    HPI Rachel Carr is a 66 y.o. female.   Patient states that she has seasonal allergies, and they began to flare up 1+ week ago with increased clear nasal drainage followed by a scratchy throat that worsened.  She has developed an occasional non-productive cough.  She is now having increase facial pressure and discomfort.   Her symptoms have not improved with Nyquil at bedtime.  No fevers, chills, and sweats.     The history is provided by the patient.    Past Medical History:  Diagnosis Date  . Barrett's syndrome   . Esophageal stricture   . Esophageal ulcer 09-2010   gastritis Dr. Glennon Hamilton  . Gastric polyps    colonoscopy 2012(due again for repeat 2017)  . Shingles    on the right eye  . Shingles     Patient Active Problem List   Diagnosis Date Noted  . Primary osteoarthritis of both knees 11/26/2016  . CAD S/P percutaneous coronary angioplasty 09/29/2016  . IFG (impaired fasting glucose) 11/20/2015  . Obese 10/14/2011  . Barrett's esophagus 01/13/2011  . Tobacco use disorder 01/02/2011  . COLONIC POLYPS 10/10/2010  . ULCER OF ESOPHAGUS WITHOUT BLEEDING 10/10/2010  . GERD 08/15/2010  . 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 ABDOMINAL HYSTERECTOMY     endometriosis  . CARPAL TUNNEL RELEASE    . CHOLECYSTECTOMY    . NISSEN FUNDOPLICATION  9735  . RADIOFREQUENCY ABLATION    . RT knee surgery      OB History    No data available       Home Medications    Prior to Admission medications   Medication Sig Start Date End Date Taking? Authorizing Provider  atorvastatin (LIPITOR) 80 MG tablet TAKE ONE TABLET (80 MG TOTAL) BY MOUTH AT BEDTIME. 02/28/16   Yes [provider]  carvedilol (COREG) 3.125 MG tablet TAKE ONE TABLET (3.125 MG TOTAL) BY MOUTH 2 (TWO) TIMES DAILY. 02/28/16  Yes [provider]  clopidogrel (PLAVIX) 75 MG tablet Take 1 tablet (75 mg total) by mouth daily. 03/26/16  Yes Hali Marry, MD  [facility] ASPIRIN 81 MG chewable tablet Chew 1 tablet by mouth. 02/28/16  Yes [provider]  fluticasone (FLONASE) 50 MCG/ACT nasal spray Place 2 sprays into both nostrils daily. 05/10/15  Yes Emeterio Reeve, DO  pantoprazole (PROTONIX) 40 MG tablet TAKE 1 TABLET (40 MG TOTAL) BY MOUTH DAILY. 12/10/16  Yes Hali Marry, MD  AMBULATORY NON FORMULARY MEDICATION Take 2 tablets by mouth as needed. Medication Name: Prunelax    [provider]  amoxicillin (AMOXIL) 875 MG tablet Take 1 tablet (875 mg total) by mouth 2 (two) times daily. 01/01/17   Kandra Nicolas, MD  nitroGLYCERIN (NITROSTAT) 0.4 MG SL tablet PLACE ONE TABLET (0.4 MG TOTAL) UNDER THE TONGUE EVERY 5 (FIVE) MINUTES AS NEEDED FOR CHEST PAIN. 02/28/16   [provider]  predniSONE (DELTASONE) 20 MG tablet Take one tab by mouth twice daily for 5 days, then one daily for 3 days. Take with food. 01/01/17   Kandra Nicolas, MD    Family History Family History  Problem Relation Age of  Onset  . Heart disease Mother   . Diabetes Father   . Cancer Father        prostrate  . Cancer Sister        esophagus  . Diabetes Sister   . Cancer Brother        kidney    Social History Social History  Substance Use Topics  . Smoking status: Current Every Day Smoker    Packs/day: 0.50    Years: 20.00  . Smokeless tobacco: Never Used     Comment: uses vape  . Alcohol use No     Allergies   Patient has no known allergies.   Review of Systems Review of Systems + sore throat + occasional cough No pleuritic pain No wheezing + nasal congestion + post-nasal drainage + sinus pain/pressure No itchy/red eyes No  earache No hemoptysis No SOB No fever, + chills No nausea No vomiting No abdominal pain No diarrhea No urinary symptoms No skin rash + fatigue No myalgias No headache Used OTC meds without relief   Physical Exam Triage Vital Signs ED Triage Vitals  Enc Vitals Group     BP 01/01/17 1105 124/81     Pulse Rate 01/01/17 1105 87     Resp 01/01/17 1105 16     Temp 01/01/17 1105 97.7 F (36.5 C)     Temp Source 01/01/17 1105 Oral     SpO2 01/01/17 1105 98 %     Weight 01/01/17 1106 179 lb (81.2 kg)     Height --      Head Circumference --      Peak Flow --      Pain Score 01/01/17 1106 7     Pain Loc --      Pain Edu? --      Excl. in [place]? --    No data found.   Updated Vital Signs BP 124/81 (BP Location: Left Arm)   Pulse 87   Temp 97.7 F (36.5 C) (Oral)   Resp 16   Wt 179 lb (81.2 kg)   SpO2 98%   BMI 33.27 kg/m   Visual Acuity Right Eye Distance:   Left Eye Distance:   Bilateral Distance:    Right Eye Near:   Left Eye Near:    Bilateral Near:     Physical Exam Nursing notes and Vital Signs reviewed. Appearance:  Patient appears stated age, and in no acute distress Eyes:  Pupils are equal, round, and reactive to light and accomodation.  Extraocular movement is intact.  Conjunctivae are not inflamed  Ears:  Canals normal.  Tympanic membranes normal.  Nose:  Congested turbinates.  Maxillary sinus tenderness is present.  Pharynx:  Normal Neck:  Supple.  Tender enlarged posterior/lateral nodes are palpated bilaterally  Lungs:  Clear to auscultation.  Breath sounds are equal.  Moving air well. Heart:  Regular rate and rhythm without murmurs, rubs, or gallops.  Abdomen:  Nontender without masses or hepatosplenomegaly.  Bowel sounds are present.  No CVA or flank tenderness.  Extremities:  No edema.  Skin:  No rash present.    UC Treatments / Results  Labs (all labs ordered are listed, but only abnormal results are displayed) Labs Reviewed - No data to  display  EKG  EKG Interpretation None       Radiology No results found.  Procedures Procedures (including critical care time)  Medications Ordered in UC Medications - No data to display   Initial Impression / Assessment  and Plan / UC Course  I have reviewed the triage vital signs and the nursing notes.  Pertinent labs & imaging results that were available during my care of the patient were reviewed by me and considered in my medical decision making (see chart for details).    Patient now has congestion of a viral URI superimposed on her usual seasonal rhinitis.    ?maxillary sinusitis Begin prednisone burst/taper and amoxicillin. Take plain guaifenesin (1200mg  extended release tabs such as Mucinex) twice daily, with plenty of water, for cough and congestion.   Get adequate rest.   May use Afrin nasal spray (or generic oxymetazoline) each morning for about 5 days and then discontinue.  Also recommend using saline nasal spray several times daily and saline nasal irrigation (AYR is a common brand).  Use Flonase nasal spray each morning after using Afrin nasal spray and saline nasal irrigation. Try warm salt water gargles for sore throat.  Stop all antihistamines for now, and other non-prescription cough/cold preparations. May take Delsym Cough Suppressant at bedtime for nighttime cough.  Follow-up with family doctor if not improving about10 days.     Final Clinical Impressions(s) / UC Diagnoses   Final diagnoses:  Viral URI  Seasonal allergic rhinitis, unspecified trigger    New Prescriptions New Prescriptions   AMOXICILLIN (AMOXIL) 875 MG TABLET    Take 1 tablet (875 mg total) by mouth 2 (two) times daily.   PREDNISONE (DELTASONE) 20 MG TABLET    Take one tab by mouth twice daily for 5 days, then one daily for 3 days. Take with food.     Kandra Nicolas, MD 01/03/17 1003

## 2017-01-01 NOTE — Discharge Instructions (Signed)
Take plain guaifenesin (1200mg  extended release tabs such as Mucinex) twice daily, with plenty of water, for cough and congestion.   Get adequate rest.   May use Afrin nasal spray (or generic oxymetazoline) each morning for about 5 days and then discontinue.  Also recommend using saline nasal spray several times daily and saline nasal irrigation (AYR is a common brand).  Use Flonase nasal spray each morning after using Afrin nasal spray and saline nasal irrigation. Try warm salt water gargles for sore throat.  Stop all antihistamines for now, and other non-prescription cough/cold preparations. May take Delsym Cough Suppressant at bedtime for nighttime cough.  Follow-up with family doctor if not improving about10 days.

## 2017-01-01 NOTE — ED Triage Notes (Signed)
Patient c/o 5 days of nasal congestion and drainage, more recently facial pain. Afebrile. Taken Day/Nyquil otc.

## 2017-03-24 ENCOUNTER — Other Ambulatory Visit: Payer: Self-pay | Admitting: Family Medicine

## 2017-03-25 ENCOUNTER — Ambulatory Visit: Payer: Medicare HMO | Admitting: Family Medicine

## 2017-03-26 ENCOUNTER — Ambulatory Visit: Payer: Medicare HMO | Admitting: Family Medicine

## 2017-04-01 DIAGNOSIS — M170 Bilateral primary osteoarthritis of knee: Secondary | ICD-10-CM | POA: Diagnosis not present

## 2017-04-01 DIAGNOSIS — M25561 Pain in right knee: Secondary | ICD-10-CM | POA: Diagnosis not present

## 2017-04-01 DIAGNOSIS — M1712 Unilateral primary osteoarthritis, left knee: Secondary | ICD-10-CM | POA: Diagnosis not present

## 2017-04-01 DIAGNOSIS — M25562 Pain in left knee: Secondary | ICD-10-CM | POA: Diagnosis not present

## 2017-04-02 ENCOUNTER — Encounter: Payer: Self-pay | Admitting: Family Medicine

## 2017-04-02 ENCOUNTER — Ambulatory Visit (INDEPENDENT_AMBULATORY_CARE_PROVIDER_SITE_OTHER): Payer: Medicare HMO | Admitting: Family Medicine

## 2017-04-02 VITALS — BP 114/80 | HR 72 | Ht 61.0 in | Wt 182.0 lb

## 2017-04-02 DIAGNOSIS — R635 Abnormal weight gain: Secondary | ICD-10-CM | POA: Diagnosis not present

## 2017-04-02 DIAGNOSIS — Z6834 Body mass index (BMI) 34.0-34.9, adult: Secondary | ICD-10-CM

## 2017-04-02 DIAGNOSIS — R7301 Impaired fasting glucose: Secondary | ICD-10-CM | POA: Diagnosis not present

## 2017-04-02 DIAGNOSIS — I2510 Atherosclerotic heart disease of native coronary artery without angina pectoris: Secondary | ICD-10-CM

## 2017-04-02 DIAGNOSIS — R0683 Snoring: Secondary | ICD-10-CM

## 2017-04-02 DIAGNOSIS — Z9861 Coronary angioplasty status: Secondary | ICD-10-CM

## 2017-04-02 LAB — POCT GLYCOSYLATED HEMOGLOBIN (HGB A1C): Hemoglobin A1C: 5.9

## 2017-04-02 MED ORDER — ATORVASTATIN CALCIUM 80 MG PO TABS: ORAL_TABLET | ORAL | 3 refills | Status: DC

## 2017-04-02 MED ORDER — AMOXICILLIN-POT CLAVULANATE 875-125 MG PO TABS: 1.0000 | ORAL_TABLET | Freq: Two times a day (BID) | ORAL | 0 refills | Status: DC

## 2017-04-02 MED ORDER — CARVEDILOL 3.125 MG PO TABS: ORAL_TABLET | ORAL | 3 refills | Status: DC

## 2017-04-02 MED ORDER — NITROGLYCERIN 0.4 MG SL SUBL: SUBLINGUAL_TABLET | SUBLINGUAL | 1 refills | Status: DC

## 2017-04-02 NOTE — Progress Notes (Signed)
Subjective:    CC: IFG -   HPI:  Impaired fasting glucose-no increased thirst or urination. No symptoms consistent with hypoglycemia.  Barrett's esophagus- on PPI and doing well.    She also thinks she may have a sinus infection.or about 6 a she's felt rundown and tired. She's had bilateral facial pain over both facial cheeks. She says it's a lot of pressure. It's worse when she bends over she feels like it's throbbing. She's had nasal congestion but not a lot of discharge or postnasal drip. No cough or sore throat. No fevers chills or sweats. No itching or runny nose.  She's also concerned about weight gain. She feels frustrated because she's not able to lose weight. She's been sticking to about 1100 cal per day and feels like overall she makes good food choices. She eats a fairly low carbohydrate diet. She has not been able to exercise regularly as she's been getting injections in her knees.  Coronary artery disease-no recent chest pain or shortness of breath. She never saw Cardiology after her Cath.    Past medical history, Surgical history, Family history not pertinant except as noted below, Social history, Allergies, and medications have been entered into the medical record, reviewed, and corrections made.   Review of Systems: No fevers, chills, night sweats, weight loss, chest pain, or shortness of breath.   Objective:    General: Well Developed, well nourished, and in no acute distress.  Neuro: Alert and oriented x3, extra-ocular muscles intact, sensation grossly intact.  HEENT: Normocephalic, atraumatic  Skin: Warm and dry, no rashes. Cardiac: Regular rate and rhythm, no murmurs rubs or gallops, no lower extremity edema.  Respiratory: Clear to auscultation bilaterally. Not using accessory muscles, speaking in full sentences.   Impression and Recommendations:   IFG - stable. 11 A1c of 5.9. continue work on healthy diet and regular exercise.  Barrett's esophagus-continue PPI.    BMI 34-discussed possible referral to bariatric center. She will check with her insurance to see if it's covered. Explained how they have a more comprehensive program that could be helpful for her. We also discussed the possibility of a ketogenic diet.She admits that she does snore loudly. She's never been evaluated for sleep apnea. We'll have her complete a stop bang questionnaire today  Acute sinusitis- We'll treat with Augmentin. Call if not better in one week.  Coronary artery disease status post angioplasty and stenting-canal discontinue Plavix and just continue with carvedilol. Refill NTG and refer to cards. She never followed up with Dr. Beverlyn Roux last year.

## 2017-04-02 NOTE — Patient Instructions (Addendum)
Can try the Ketogenic Diet. There are some introductory simples books on [employer].   Ok to stop the plavix

## 2017-04-06 ENCOUNTER — Other Ambulatory Visit: Payer: Self-pay | Admitting: Family Medicine

## 2017-04-08 DIAGNOSIS — M25561 Pain in right knee: Secondary | ICD-10-CM | POA: Diagnosis not present

## 2017-04-08 DIAGNOSIS — M1711 Unilateral primary osteoarthritis, right knee: Secondary | ICD-10-CM | POA: Diagnosis not present

## 2017-04-15 DIAGNOSIS — M1711 Unilateral primary osteoarthritis, right knee: Secondary | ICD-10-CM | POA: Diagnosis not present

## 2017-04-15 DIAGNOSIS — M25561 Pain in right knee: Secondary | ICD-10-CM | POA: Diagnosis not present

## 2017-04-23 ENCOUNTER — Other Ambulatory Visit: Payer: Self-pay | Admitting: Family Medicine

## 2017-04-26 ENCOUNTER — Telehealth: Payer: Self-pay | Admitting: Family Medicine

## 2017-04-26 DIAGNOSIS — Z6834 Body mass index (BMI) 34.0-34.9, adult: Secondary | ICD-10-CM

## 2017-04-26 DIAGNOSIS — R0683 Snoring: Secondary | ICD-10-CM

## 2017-04-26 NOTE — Telephone Encounter (Signed)
In lab sleep study denied for [facility]. Will place order for home sleep study. New order placed

## 2017-04-27 DIAGNOSIS — M25561 Pain in right knee: Secondary | ICD-10-CM | POA: Diagnosis not present

## 2017-04-27 DIAGNOSIS — M1711 Unilateral primary osteoarthritis, right knee: Secondary | ICD-10-CM | POA: Diagnosis not present

## 2017-04-27 NOTE — Telephone Encounter (Signed)
lvm informing pt of change to order.Rachel Carr

## 2017-05-06 DIAGNOSIS — M25562 Pain in left knee: Secondary | ICD-10-CM | POA: Diagnosis not present

## 2017-05-06 DIAGNOSIS — M1712 Unilateral primary osteoarthritis, left knee: Secondary | ICD-10-CM | POA: Diagnosis not present

## 2017-05-13 DIAGNOSIS — M25562 Pain in left knee: Secondary | ICD-10-CM | POA: Diagnosis not present

## 2017-05-13 DIAGNOSIS — M1712 Unilateral primary osteoarthritis, left knee: Secondary | ICD-10-CM | POA: Diagnosis not present

## 2017-05-20 DIAGNOSIS — M1712 Unilateral primary osteoarthritis, left knee: Secondary | ICD-10-CM | POA: Diagnosis not present

## 2017-05-20 DIAGNOSIS — M25562 Pain in left knee: Secondary | ICD-10-CM | POA: Diagnosis not present

## 2017-05-27 DIAGNOSIS — M1712 Unilateral primary osteoarthritis, left knee: Secondary | ICD-10-CM | POA: Diagnosis not present

## 2017-05-27 DIAGNOSIS — M25562 Pain in left knee: Secondary | ICD-10-CM | POA: Diagnosis not present

## 2017-06-25 ENCOUNTER — Encounter: Payer: Self-pay | Admitting: Emergency Medicine

## 2017-06-25 ENCOUNTER — Emergency Department (INDEPENDENT_AMBULATORY_CARE_PROVIDER_SITE_OTHER)
Admission: EM | Admit: 2017-06-25 | Discharge: 2017-06-25 | Disposition: A | Discharge status: Home / Self Care | Payer: Medicare HMO | Source: Home / Self Care | Attending: Family Medicine | Admitting: Family Medicine

## 2017-06-25 DIAGNOSIS — J0100 Acute maxillary sinusitis, unspecified: Secondary | ICD-10-CM | POA: Diagnosis not present

## 2017-06-25 MED ORDER — AMOXICILLIN-POT CLAVULANATE 875-125 MG PO TABS: 1.0000 | ORAL_TABLET | Freq: Two times a day (BID) | ORAL | 0 refills | Status: DC

## 2017-06-25 NOTE — Discharge Instructions (Signed)
°  You may take 500mg acetaminophen every 4-6 hours or in combination with ibuprofen 400-600mg every 6-8 hours as needed for pain, inflammation, and fever. ° °Be sure to drink at least eight 8oz glasses of water to stay well hydrated and get at least 8 hours of sleep at night, preferably more while sick.  ° °Please take antibiotics as prescribed and be sure to complete entire course even if you start to feel better to ensure infection does not come back. ° °

## 2017-06-25 NOTE — ED Provider Notes (Signed)
[facility]    CSN: [id number] Arrival date & time: 06/25/17  2505     History   Chief Complaint Chief Complaint  Patient presents with  . Facial Pain    HPI Rachel Carr is a 66 y.o. female.   HPI  Rachel Carr is a 66 y.o. female presenting to UC with c/o 1 week of gradually worsening sinus congestion with associated facial pain and pressure.  She has tried OTC Copy[employer], advertising with mild relief.  Hx of sinus infections in the past.  She is unsure the last time she had a sinus infection. Denies fever, chills, n/v/d.    Past Medical History:  Diagnosis Date  . Barrett's syndrome   . Esophageal stricture   . Esophageal ulcer 09-2010   gastritis Dr. Glennon Hamilton  . Gastric polyps    colonoscopy 2012(due again for repeat 2017)  . Shingles    on the right eye  . Shingles     Patient Active Problem List   Diagnosis Date Noted  . Primary osteoarthritis of both knees 11/26/2016  . CAD S/P percutaneous coronary angioplasty 09/29/2016  . IFG (impaired fasting glucose) 11/20/2015  . Obese 10/14/2011  . Barrett's esophagus 01/13/2011  . Tobacco use disorder 01/02/2011  . COLONIC POLYPS 10/10/2010  . ULCER OF ESOPHAGUS WITHOUT BLEEDING 10/10/2010  . GERD 08/15/2010  . 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 ABDOMINAL HYSTERECTOMY     endometriosis  . CARPAL TUNNEL RELEASE    . CHOLECYSTECTOMY    . NISSEN FUNDOPLICATION  9379  . RADIOFREQUENCY ABLATION    . RT knee surgery      OB History    No data available       Home Medications    Prior to Admission medications   Medication Sig Start Date End Date Taking? Authorizing Provider  AMBULATORY NON FORMULARY MEDICATION Take 2 tablets by mouth as needed. Medication Name: Prunelax    [provider]  amoxicillin-clavulanate (AUGMENTIN) 875-125 MG tablet Take 1  tablet 2 (two) times daily by mouth. One po bid x 7 days 06/25/17   Noe Gens, PA-C  atorvastatin (LIPITOR) 80 MG tablet TAKE ONE TABLET (80 MG TOTAL) BY MOUTH AT BEDTIME. 04/02/17   Hali Marry, MD  carvedilol (COREG) 3.125 MG tablet TAKE ONE TABLET (3.125 MG TOTAL) BY MOUTH 2 (TWO) TIMES DAILY. 04/02/17   Hali Marry, MD  [facility] ASPIRIN 81 MG chewable tablet Chew 1 tablet by mouth. 02/28/16   [provider]  fluticasone (FLONASE) 50 MCG/ACT nasal spray Place 2 sprays into both nostrils daily. 05/10/15   Emeterio Reeve, DO  nitroGLYCERIN (NITROSTAT) 0.4 MG SL tablet PLACE ONE TABLET (0.4 MG TOTAL) UNDER THE TONGUE EVERY 5 (FIVE) MINUTES AS NEEDED FOR CHEST PAIN. 04/02/17   Hali Marry, MD  pantoprazole (PROTONIX) 40 MG tablet TAKE 1 TABLET (40 MG TOTAL) BY MOUTH DAILY. 04/23/17   Hali Marry, MD    Family History Family History  Problem Relation Age of Onset  . Heart disease Mother   . Diabetes Father   . Cancer Father        prostrate  . Cancer Sister        esophagus  . Diabetes Sister   . Cancer Brother        kidney    Social History Social History  Tobacco Use  . Smoking status: Current Every Day Smoker    Packs/day: 0.50    Years: 20.00    Pack years: 10.00  . Smokeless tobacco: Never Used  . Tobacco comment: uses vape  Substance Use Topics  . Alcohol use: No  . Drug use: No     Allergies   Patient has no known allergies.   Review of Systems Review of Systems  Constitutional: Negative for chills and fever.  HENT: Positive for congestion, ear pain ( fullness), postnasal drip, sinus pressure, sinus pain and sore throat. Negative for trouble swallowing and voice change.   Respiratory: Positive for cough ( minimal). Negative for shortness of breath.   Cardiovascular: Negative for chest pain and palpitations.  Gastrointestinal: Negative for abdominal pain, diarrhea, nausea and vomiting.  Musculoskeletal:  Negative for arthralgias, back pain and myalgias.  Skin: Negative for rash.  Neurological: Positive for headaches ( frontal). Negative for dizziness and light-headedness.     Physical Exam Triage Vital Signs ED Triage Vitals  Enc Vitals Group     BP 06/25/17 0944 123/83     Pulse Rate 06/25/17 0944 68     Resp --      Temp 06/25/17 0944 97.6 F (36.4 C)     Temp Source 06/25/17 0944 Oral     SpO2 06/25/17 0944 98 %     Weight 06/25/17 0945 188 lb (85.3 kg)     Height --      Head Circumference --      Peak Flow --      Pain Score 06/25/17 0945 0     Pain Loc --      Pain Edu? --      Excl. in [place]? --    No data found.  Updated Vital Signs BP 123/83 (BP Location: Right Arm)   Pulse 68   Temp 97.6 F (36.4 C) (Oral)   Wt 188 lb (85.3 kg)   SpO2 98%   BMI 35.52 kg/m   Visual Acuity Right Eye Distance:   Left Eye Distance:   Bilateral Distance:    Right Eye Near:   Left Eye Near:    Bilateral Near:     Physical Exam  Constitutional: She is oriented to person, place, and time. She appears well-developed and well-nourished. No distress.  HENT:  Head: Normocephalic and atraumatic.  Right Ear: Tympanic membrane normal.  Left Ear: Tympanic membrane normal.  Nose: Mucosal edema present. Right sinus exhibits maxillary sinus tenderness. Right sinus exhibits no frontal sinus tenderness. Left sinus exhibits maxillary sinus tenderness. Left sinus exhibits no frontal sinus tenderness.  Mouth/Throat: Uvula is midline, oropharynx is clear and moist and mucous membranes are normal.  Eyes: EOM are normal.  Neck: Normal range of motion. Neck supple.  Cardiovascular: Normal rate and regular rhythm.  Pulmonary/Chest: Effort normal and breath sounds normal. No stridor. No respiratory distress. She has no wheezes. She has no rales.  Musculoskeletal: Normal range of motion.  Lymphadenopathy:    She has no cervical adenopathy.  Neurological: She is alert and oriented to person,  place, and time.  Skin: Skin is warm and dry. She is not diaphoretic.  Psychiatric: She has a normal mood and affect. Her behavior is normal.  Nursing note and vitals reviewed.    UC Treatments / Results  Labs (all labs ordered are listed, but only abnormal results are displayed) Labs Reviewed - No data to display  EKG  EKG Interpretation None  Radiology No results found.  Procedures Procedures (including critical care time)  Medications Ordered in UC Medications - No data to display   Initial Impression / Assessment and Plan / UC Course  I have reviewed the triage vital signs and the nursing notes.  Pertinent labs & imaging results that were available during my care of the patient were reviewed by me and considered in my medical decision making (see chart for details).     Hx and exam c/w maxillary sinusitis  Will tx with Augmentin Home care instructions provided F/u with PCP in 1 week if not improving Pt did ask about receiving flu vaccine today, encouraged her to speak with her PCP next week after completing course of antibiotics and feeling better.    Final Clinical Impressions(s) / UC Diagnoses   Final diagnoses:  Acute non-recurrent maxillary sinusitis    ED Discharge Orders        Ordered    amoxicillin-clavulanate (AUGMENTIN) 875-125 MG tablet  2 times daily     06/25/17 0950       Controlled Substance Prescriptions [place] Controlled Substance Registry consulted? Not Applicable   Noe Gens, PA-C 06/25/17 1002

## 2017-06-25 NOTE — ED Triage Notes (Signed)
Pt c/o facial pain and post nasal drip x1 week. Denies fever. Using OTC meds with no relief.

## 2017-06-28 NOTE — Progress Notes (Deleted)
Referring-Catherine Madilyn Fireman, MD Reason for referral-CAD  HPI: 66 yo female for evaluation of CAD at request of Beatrice Lecher MD. Echocardiogram 2017 showed normal LV function, trace mitral and tricuspid regurgitation. Cardiac catheterization July 2017 showed a 99% RCA. Patient had PCI with drug-eluting stent.  Current Outpatient Medications  Medication Sig Dispense Refill  . AMBULATORY NON FORMULARY MEDICATION Take 2 tablets by mouth as needed. Medication Name: Prunelax    . amoxicillin-clavulanate (AUGMENTIN) 875-125 MG tablet Take 1 tablet 2 (two) times daily by mouth. One po bid x 7 days 14 tablet 0  . atorvastatin (LIPITOR) 80 MG tablet TAKE ONE TABLET (80 MG TOTAL) BY MOUTH AT BEDTIME. 90 tablet 3  . carvedilol (COREG) 3.125 MG tablet TAKE ONE TABLET (3.125 MG TOTAL) BY MOUTH 2 (TWO) TIMES DAILY. 180 tablet 3  . [facility] ASPIRIN 81 MG chewable tablet Chew 1 tablet by mouth.  11  . fluticasone (FLONASE) 50 MCG/ACT nasal spray Place 2 sprays into both nostrils daily. 16 g 3  . nitroGLYCERIN (NITROSTAT) 0.4 MG SL tablet PLACE ONE TABLET (0.4 MG TOTAL) UNDER THE TONGUE EVERY 5 (FIVE) MINUTES AS NEEDED FOR CHEST PAIN. 10 tablet 1  . pantoprazole (PROTONIX) 40 MG tablet TAKE 1 TABLET (40 MG TOTAL) BY MOUTH DAILY. 90 tablet 1   No current facility-administered medications for this visit.     No Known Allergies  Past Medical History:  Diagnosis Date  . Barrett's syndrome   . Esophageal stricture   . Esophageal ulcer 09-2010   gastritis Dr. Glennon Hamilton  . Gastric polyps    colonoscopy 2012(due again for repeat 2017)  . Shingles    on the right eye  . Shingles     Past Surgical History:  Procedure Laterality Date  . ABDOMINAL HYSTERECTOMY     endometriosis  . CARPAL TUNNEL RELEASE    . CHOLECYSTECTOMY    . NISSEN FUNDOPLICATION  7353  . RADIOFREQUENCY ABLATION    . RT knee surgery      Social History   Socioeconomic History  . Marital status: Married    Spouse  name: Not on file  . Number of children: Not on file  . Years of education: Not on file  . Highest education level: Not on file  Social Needs  . Financial resource strain: Not on file  . Food insecurity - worry: Not on file  . Food insecurity - inability: Not on file  . Transportation needs - medical: Not on file  . Transportation needs - non-medical: Not on file  Occupational History  . Not on file  Tobacco Use  . Smoking status: Current Every Day Smoker    Packs/day: 0.50    Years: 20.00    Pack years: 10.00  . Smokeless tobacco: Never Used  . Tobacco comment: uses vape  Substance and Sexual Activity  . Alcohol use: No  . Drug use: No  . 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  Other Topics Concern  . Not on file  Social History Narrative  . Not on file    Family History  Problem Relation Age of Onset  . Heart disease Mother   . Diabetes Father   . Cancer Father        prostrate  . Cancer Sister        esophagus  . Diabetes Sister   . Cancer Brother        kidney    ROS:  no fevers or chills, productive cough, hemoptysis, dysphasia, odynophagia, melena, hematochezia, dysuria, hematuria, rash, seizure activity, orthopnea, PND, pedal edema, claudication. Remaining systems are negative.  Physical Exam:   There were no vitals taken for this visit.  General:  Well developed/well nourished in NAD Skin warm/dry Patient not depressed No peripheral clubbing Back-normal HEENT-normal/normal eyelids Neck supple/normal carotid upstroke bilaterally; no bruits; no JVD; no thyromegaly chest - CTA/ normal expansion CV - RRR/normal S1 and S2; no murmurs, rubs or gallops;  PMI nondisplaced Abdomen -NT/ND, no HSM, no mass, + bowel sounds, no bruit 2+ femoral pulses, no bruits Ext-no edema, chords, 2+ DP Neuro-grossly nonfocal  ECG - personally reviewed  A/P  1  Kirk Ruths, MD

## 2017-07-07 ENCOUNTER — Telehealth: Payer: Self-pay | Admitting: Cardiology

## 2017-07-07 ENCOUNTER — Ambulatory Visit: Payer: Medicare HMO | Admitting: Cardiology

## 2017-07-07 NOTE — Telephone Encounter (Signed)
Pt says she has a fever,should she keep her appt for today?

## 2017-07-07 NOTE — Telephone Encounter (Signed)
Spoke with pt, her appointment was rescheduled.

## 2017-07-24 DIAGNOSIS — H6122 Impacted cerumen, left ear: Secondary | ICD-10-CM | POA: Diagnosis not present

## 2017-07-24 DIAGNOSIS — J309 Allergic rhinitis, unspecified: Secondary | ICD-10-CM | POA: Diagnosis not present

## 2017-08-28 ENCOUNTER — Emergency Department
Admission: EM | Admit: 2017-08-28 | Discharge: 2017-08-28 | Disposition: A | Discharge status: Home / Self Care | Payer: Medicare HMO | Source: Home / Self Care | Attending: Family Medicine | Admitting: Family Medicine

## 2017-08-28 ENCOUNTER — Other Ambulatory Visit: Payer: Self-pay

## 2017-08-28 ENCOUNTER — Encounter: Payer: Self-pay | Admitting: Emergency Medicine

## 2017-08-28 DIAGNOSIS — J069 Acute upper respiratory infection, unspecified: Secondary | ICD-10-CM | POA: Diagnosis not present

## 2017-08-28 MED ORDER — IBUPROFEN 600 MG PO TABS
600.0000 mg | ORAL_TABLET | Freq: Once | ORAL | Status: AC
  Administered 2017-08-28: 600 mg via ORAL

## 2017-08-28 MED ORDER — AZITHROMYCIN 250 MG PO TABS: ORAL_TABLET | ORAL | 0 refills | Status: DC

## 2017-08-28 NOTE — Discharge Instructions (Signed)
If cough develops, begin plain guaifenesin (1200mg  extended release tabs such as Mucinex) twice daily, with plenty of water, for cough and congestion.  Get adequate rest.   May use Afrin nasal spray (or generic oxymetazoline) each morning for about 5 days and then discontinue.  Also recommend using saline nasal spray several times daily and saline nasal irrigation (AYR is a common brand).  Use Flonase nasal spray each morning after using Afrin nasal spray and saline nasal irrigation. Try warm salt water gargles for sore throat.  Stop all antihistamines for now, and other non-prescription cough/cold preparations. May take Ibuprofen 200mg , 4 tabs every 8 hours with food for chest/sternum discomfort. May take Delsym Cough Suppressant at bedtime for nighttime cough.

## 2017-08-28 NOTE — ED Triage Notes (Signed)
Reports about 5 days of sinus pressure and headache; Took excedrin migraine 0630 without relief.

## 2017-08-28 NOTE — ED Provider Notes (Signed)
[facility]    CSN: [id number] Arrival date & time: 08/28/17  1253     History   Chief Complaint Chief Complaint  Patient presents with  . Headache  . Facial Pain    HPI Rachel Carr is a 67 y.o. female.   Five days ago patient developed increased sinus congestion, headache, facial pressure, and fatigue.  No sore throat or cough.  Her ears feel clogged.  She feels tightness in her anterior chest but no pleuritic pain or shortness of breath.   The history is provided by the patient.    Past Medical History:  Diagnosis Date  . Barrett's syndrome   . Esophageal stricture   . Esophageal ulcer 09-2010   gastritis Dr. Glennon Hamilton  . Gastric polyps    colonoscopy 2012(due again for repeat 2017)  . Shingles    on the right eye  . Shingles     Patient Active Problem List   Diagnosis Date Noted  . Primary osteoarthritis of both knees 11/26/2016  . CAD S/P percutaneous coronary angioplasty 09/29/2016  . IFG (impaired fasting glucose) 11/20/2015  . Obese 10/14/2011  . Barrett's esophagus 01/13/2011  . Tobacco use disorder 01/02/2011  . COLONIC POLYPS 10/10/2010  . ULCER OF ESOPHAGUS WITHOUT BLEEDING 10/10/2010  . GERD 08/15/2010  . 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 ABDOMINAL HYSTERECTOMY     endometriosis  . CARPAL TUNNEL RELEASE    . CHOLECYSTECTOMY    . NISSEN FUNDOPLICATION  6195  . RADIOFREQUENCY ABLATION    . RT knee surgery      OB History    No data available       Home Medications    Prior to Admission medications   Medication Sig Start Date End Date Taking? Authorizing Provider  AMBULATORY NON FORMULARY MEDICATION Take 2 tablets by mouth as needed. Medication Name: Prunelax    [provider]  atorvastatin (LIPITOR) 80 MG tablet TAKE ONE TABLET (80 MG TOTAL) BY MOUTH AT BEDTIME. 04/02/17    Hali Marry, MD  azithromycin (ZITHROMAX Z-PAK) 250 MG tablet Take 2 tabs today; then begin one tab once daily for 4 more days. 08/28/17   Kandra Nicolas, MD  carvedilol (COREG) 3.125 MG tablet TAKE ONE TABLET (3.125 MG TOTAL) BY MOUTH 2 (TWO) TIMES DAILY. 04/02/17   Hali Marry, MD  [facility] ASPIRIN 81 MG chewable tablet Chew 1 tablet by mouth. 02/28/16   [provider]  fluticasone (FLONASE) 50 MCG/ACT nasal spray Place 2 sprays into both nostrils daily. 05/10/15   Emeterio Reeve, DO  nitroGLYCERIN (NITROSTAT) 0.4 MG SL tablet PLACE ONE TABLET (0.4 MG TOTAL) UNDER THE TONGUE EVERY 5 (FIVE) MINUTES AS NEEDED FOR CHEST PAIN. 04/02/17   Hali Marry, MD  pantoprazole (PROTONIX) 40 MG tablet TAKE 1 TABLET (40 MG TOTAL) BY MOUTH DAILY. 04/23/17   Hali Marry, MD    Family History Family History  Problem Relation Age of Onset  . Heart disease Mother   . Diabetes Father   . Cancer Father        prostrate  . Cancer Sister        esophagus  . Diabetes Sister   . Cancer Brother        kidney    Social History Social History   Tobacco Use  . Smoking status: Current Every  Day Smoker    Packs/day: 0.50    Years: 20.00    Pack years: 10.00  . Smokeless tobacco: Never Used  . Tobacco comment: uses vape  Substance Use Topics  . Alcohol use: No  . Drug use: No     Allergies   Patient has no known allergies.   Review of Systems Review of Systems No sore throat No cough No pleuritic pain No wheezing + nasal congestion + post-nasal drainage + sinus pain/pressure No itchy/red eyes ? earache No hemoptysis No SOB No fever/chills No nausea No vomiting No abdominal pain No diarrhea No urinary symptoms No skin rash + fatigue No myalgias + headache Used OTC meds without relief   Physical Exam Triage Vital Signs ED Triage Vitals [08/28/17 1346]  Enc Vitals Group     BP 106/74     Pulse Rate 79     Resp 16     Temp  97.7 F (36.5 C)     Temp Source Oral     SpO2 99 %     Weight      Height      Head Circumference      Peak Flow      Pain Score 4     Pain Loc      Pain Edu?      Excl. in [place]?    No data found.  Updated Vital Signs BP 106/74 (BP Location: Right Arm)   Pulse 79   Temp 97.7 F (36.5 C) (Oral)   Resp 16   SpO2 99%   Visual Acuity Right Eye Distance:   Left Eye Distance:   Bilateral Distance:    Right Eye Near:   Left Eye Near:    Bilateral Near:     Physical Exam Nursing notes and Vital Signs reviewed. Appearance:  Patient appears stated age, and in no acute distress Eyes:  Pupils are equal, round, and reactive to light and accomodation.  Extraocular movement is intact.  Conjunctivae are not inflamed  Ears:  Canals normal.  Tympanic membranes normal.  Nose:  Mildly congested turbinates.  Maxillary sinus tenderness is present.  Pharynx:  Normal Neck:  Supple.  Enlarged posterior/lateral nodes are palpated bilaterally, tender to palpation on the left.   Lungs:  Clear to auscultation.  Breath sounds are equal.  Moving air well. Chest:  Distinct tenderness to palpation over the mid-sternum.  Heart:  Regular rate and rhythm without murmurs, rubs, or gallops.  Abdomen:  Nontender without masses or hepatosplenomegaly.  Bowel sounds are present.  No CVA or flank tenderness.  Extremities:  No edema.  Skin:  No rash present.    UC Treatments / Results  Labs (all labs ordered are listed, but only abnormal results are displayed) Labs Reviewed - No data to display  EKG  EKG Interpretation None       Radiology No results found.  Procedures Procedures (including critical care time)  Medications Ordered in UC Medications  ibuprofen (ADVIL,MOTRIN) tablet 600 mg (600 mg Oral Given 08/28/17 1328)     Initial Impression / Assessment and Plan / UC Course  I have reviewed the triage vital signs and the nursing notes.  Pertinent labs & imaging results that were  available during my care of the patient were reviewed by me and considered in my medical decision making (see chart for details).    Suspect early viral URI. ?maxillary sinusitis. Begin Z-pack If cough develops, begin plain guaifenesin (1200mg  extended release tabs such  as Mucinex) twice daily, with plenty of water, for cough and congestion.  Get adequate rest.   May use Afrin nasal spray (or generic oxymetazoline) each morning for about 5 days and then discontinue.  Also recommend using saline nasal spray several times daily and saline nasal irrigation (AYR is a common brand).  Use Flonase nasal spray each morning after using Afrin nasal spray and saline nasal irrigation. Try warm salt water gargles for sore throat.  Stop all antihistamines for now, and other non-prescription cough/cold preparations. May take Ibuprofen 200mg , 4 tabs every 8 hours with food for chest/sternum discomfort. May take Delsym Cough Suppressant at bedtime for nighttime cough.  Followup with Family Doctor if not improved in about 10 days.    Final Clinical Impressions(s) / UC Diagnoses   Final diagnoses:  Viral URI    ED Discharge Orders        Ordered    azithromycin (ZITHROMAX Z-PAK) 250 MG tablet     08/28/17 1423          Kandra Nicolas, MD 09/01/17 [phone]

## 2017-09-07 NOTE — Progress Notes (Signed)
Referring-Rachel Metheney MD Reason for referral-CAD  HPI: 67 yo female for evaluation of CAD at request of Rachel Lecher MD. Previously followed by [facility]. Cath 7/17 showed 99 RCA and no other obstructive CAD; had PCI of RCA with DES. Last echo 7/17 showed normal LV function.  Patient presents to establish.  She has dyspnea with more vigorous activities but not routine activities.  No orthopnea, PND, palpitations, syncope or chest pain.  Occasional mild pedal edema.  We are now asked to evaluate.  Current Outpatient Medications  Medication Sig Dispense Refill  . atorvastatin (LIPITOR) 80 MG tablet TAKE ONE TABLET (80 MG TOTAL) BY MOUTH AT BEDTIME. 90 tablet 3  . carvedilol (COREG) 3.125 MG tablet TAKE ONE TABLET (3.125 MG TOTAL) BY MOUTH 2 (TWO) TIMES DAILY. 180 tablet 3  . [facility] ASPIRIN 81 MG chewable tablet Chew 1 tablet by mouth.  11  . GARLIC PO Take 2,355 mg by mouth daily.    . Misc Natural Products (OSTEO BI-FLEX JOINT SHIELD PO) Take by mouth 2 (two) times daily.    . nitroGLYCERIN (NITROSTAT) 0.4 MG SL tablet PLACE ONE TABLET (0.4 MG TOTAL) UNDER THE TONGUE EVERY 5 (FIVE) MINUTES AS NEEDED FOR CHEST PAIN. 10 tablet 1   No current facility-administered medications for this visit.     No Known Allergies   Past Medical History:  Diagnosis Date  . Barrett's syndrome   . CAD (coronary artery disease)   . Esophageal stricture   . Esophageal ulcer 09-2010   gastritis Dr. Glennon Carr  . Gastric polyps    colonoscopy 2012(due again for repeat 2017)  . Shingles    on the right eye    Past Surgical History:  Procedure Laterality Date  . ABDOMINAL HYSTERECTOMY     endometriosis  . CARPAL TUNNEL RELEASE    . CHOLECYSTECTOMY    . NISSEN FUNDOPLICATION  7322  . RADIOFREQUENCY ABLATION    . RT knee surgery      Social History   Socioeconomic History  . Marital status: Married    Spouse name: Not on file  . Number of children: 1  . Years of education: Not on  file  . Highest education level: Not on file  Social Needs  . Financial resource strain: Not on file  . Food insecurity - worry: Not on file  . Food insecurity - inability: Not on file  . Transportation needs - medical: Not on file  . Transportation needs - non-medical: Not on file  Occupational History  . Not on file  Tobacco Use  . Smoking status: Former Smoker    Packs/day: 0.50    Years: 20.00    Pack years: 10.00  . Smokeless tobacco: Never Used  . Tobacco comment: uses vape  Substance and Sexual Activity  . Alcohol use: No  . Drug use: No  . 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  Other Topics Concern  . Not on file  Social History Narrative  . Not on file    Family History  Problem Relation Age of Onset  . Heart disease Mother   . Diabetes Father   . Cancer Father        prostrate  . Cancer Sister        esophagus  . Diabetes Sister   . Cancer Brother        kidney    ROS: no fevers or chills, productive cough, hemoptysis, dysphasia, odynophagia,  melena, hematochezia, dysuria, hematuria, rash, seizure activity, orthopnea, PND, pedal edema, claudication. Remaining systems are negative.  Physical Exam:   Blood pressure (!) 141/89, pulse 70, height 5\' 1"  (1.549 m), weight 190 lb 6.4 oz (86.4 kg).  General:  Well developed/well nourished in NAD Skin warm/dry Patient not depressed No peripheral clubbing Back-normal HEENT-normal/normal eyelids Neck supple/normal carotid upstroke bilaterally; no bruits; no JVD; no thyromegaly chest - CTA/ normal expansion CV - RRR/normal S1 and S2; no murmurs, rubs or gallops;  PMI nondisplaced Abdomen -NT/ND, no HSM, no mass, + bowel sounds, no bruit 2+ femoral pulses, no bruits Ext-no edema, chords, 2+ DP Neuro-grossly nonfocal  ECG -sinus rhythm at a rate of 70.  No ST changes.  Personally reviewed  A/P  1 coronary artery disease-prior PCI.  Continue aspirin and statin.  She has  not had any recurrent symptoms.  2 hypertension-blood pressure mildly elevated.  Increase carvedilol to 6.25 mg twice daily and follow.  3 hyperlipidemia-continue statin.  Rachel Ruths, MD

## 2017-09-15 ENCOUNTER — Ambulatory Visit (INDEPENDENT_AMBULATORY_CARE_PROVIDER_SITE_OTHER): Payer: Medicare HMO | Admitting: Cardiology

## 2017-09-15 ENCOUNTER — Encounter: Payer: Self-pay | Admitting: Cardiology

## 2017-09-15 VITALS — BP 141/89 | HR 70 | Ht 61.0 in | Wt 190.4 lb

## 2017-09-15 DIAGNOSIS — E7800 Pure hypercholesterolemia, unspecified: Secondary | ICD-10-CM

## 2017-09-15 DIAGNOSIS — I2510 Atherosclerotic heart disease of native coronary artery without angina pectoris: Secondary | ICD-10-CM | POA: Diagnosis not present

## 2017-09-15 DIAGNOSIS — I10 Essential (primary) hypertension: Secondary | ICD-10-CM | POA: Diagnosis not present

## 2017-09-15 MED ORDER — CARVEDILOL 6.25 MG PO TABS: 6.2500 mg | ORAL_TABLET | Freq: Two times a day (BID) | ORAL | 3 refills | Status: DC

## 2017-09-15 NOTE — Patient Instructions (Signed)
Medication Instructions:   INCREASE CARVEDILOL TO 6.25 MG TWICE DAILY= 2 OF THE 3.125 MG TABLETS TWICE DAILY  Follow-Up:  Your physician wants you to follow-up in: [place] will receive a reminder letter in the mail two months in advance. If you don't receive a letter, please call our office to schedule the follow-up appointment.   If you need a refill on your cardiac medications before your next appointment, please call your pharmacy.

## 2017-10-25 ENCOUNTER — Ambulatory Visit (INDEPENDENT_AMBULATORY_CARE_PROVIDER_SITE_OTHER): Payer: Medicare HMO | Admitting: Family Medicine

## 2017-10-25 ENCOUNTER — Encounter: Payer: Self-pay | Admitting: Family Medicine

## 2017-10-25 VITALS — BP 156/87 | HR 66 | Ht 61.0 in | Wt 193.0 lb

## 2017-10-25 DIAGNOSIS — Z1231 Encounter for screening mammogram for malignant neoplasm of breast: Secondary | ICD-10-CM | POA: Diagnosis not present

## 2017-10-25 DIAGNOSIS — I2510 Atherosclerotic heart disease of native coronary artery without angina pectoris: Secondary | ICD-10-CM

## 2017-10-25 DIAGNOSIS — Z780 Asymptomatic menopausal state: Secondary | ICD-10-CM

## 2017-10-25 DIAGNOSIS — J0100 Acute maxillary sinusitis, unspecified: Secondary | ICD-10-CM

## 2017-10-25 DIAGNOSIS — R7309 Other abnormal glucose: Secondary | ICD-10-CM | POA: Diagnosis not present

## 2017-10-25 DIAGNOSIS — Z13220 Encounter for screening for lipoid disorders: Secondary | ICD-10-CM | POA: Diagnosis not present

## 2017-10-25 DIAGNOSIS — R7301 Impaired fasting glucose: Secondary | ICD-10-CM | POA: Diagnosis not present

## 2017-10-25 DIAGNOSIS — Z9861 Coronary angioplasty status: Secondary | ICD-10-CM | POA: Diagnosis not present

## 2017-10-25 DIAGNOSIS — R7989 Other specified abnormal findings of blood chemistry: Secondary | ICD-10-CM | POA: Diagnosis not present

## 2017-10-25 LAB — POCT GLYCOSYLATED HEMOGLOBIN (HGB A1C): Hemoglobin A1C: 6

## 2017-10-25 MED ORDER — AMOXICILLIN-POT CLAVULANATE 875-125 MG PO TABS: 1.0000 | ORAL_TABLET | Freq: Two times a day (BID) | ORAL | 0 refills | Status: DC

## 2017-10-25 NOTE — Progress Notes (Signed)
Subjective:    CC: IFG, CAD   HPI:  Impaired fasting glucose-no increased thirst or urination. No symptoms consistent with hypoglycemia.  CAD - currently on Lipitor.  Tolerating statin well without any side effects or myalgias.  She did see cardiology and had an EKG and was told to follow-up in 1 year.  He also complains of some sinus pressure for the last couple days.  Been taking Mucinex for it.  She was also seen for upper respiratory symptoms 2 months ago and given a Z-Pak.  She did end up taking it but says she feels like it never completely knocked out her sinus symptoms.  Past medical history, Surgical history, Family history not pertinant except as noted below, Social history, Allergies, and medications have been entered into the medical record, reviewed, and corrections made.   Review of Systems: No fevers, chills, night sweats, weight loss, chest pain, or shortness of breath.   Objective:    General: Well Developed, well nourished, and in no acute distress.  Neuro: Alert and oriented x3, extra-ocular muscles intact, sensation grossly intact.  HEENT: Normocephalic, atraumatic oropharynx is clear, TMs and canals are clear bilaterally.  No significant cervical lymphadenopathy.  Tender over both maxillary sinuses. Skin: Warm and dry, no rashes. Cardiac: Regular rate and rhythm, no murmurs rubs or gallops, no lower extremity edema.  Respiratory: Clear to auscultation bilaterally. Not using accessory muscles, speaking in full sentences.   Impression and Recommendations:    IFG - Well controlled. Continue current regimen. Follow up in  22months. A1C of 6.0.  More recently started exercising again and has been doing 15-minute walks.  At one point time she was walking on the treadmill every day but started having significant pain and problems with her knees.  CAD -stable.  Continue statin and beta-blocker.  Follow with cardiology yearly.  Due for lipid panel.  Acute sinusitis-we will  treat with Augmentin.  New prescription sent to pharmacy.

## 2017-10-26 LAB — COMPLETE METABOLIC PANEL WITH GFR
AG Ratio: 2 (calc) (ref 1.0–2.5)
ALBUMIN MSPROF: 4.3 g/dL (ref 3.6–5.1)
ALKALINE PHOSPHATASE (APISO): 92 U/L (ref 33–130)
ALT: 14 U/L (ref 6–29)
AST: 14 U/L (ref 10–35)
BUN: 13 mg/dL (ref 7–25)
CALCIUM: 9.3 mg/dL (ref 8.6–10.4)
CO2: 31 mmol/L (ref 20–32)
CREATININE: 0.65 mg/dL (ref 0.50–0.99)
Chloride: 106 mmol/L (ref 98–110)
GFR, EST AFRICAN AMERICAN: 107 mL/min/{1.73_m2} (ref >=60)
GFR, EST NON AFRICAN AMERICAN: 93 mL/min/{1.73_m2} (ref >=60)
GLUCOSE: 106 mg/dL — AB (ref 65–99)
Globulin: 2.1 g/dL (calc) (ref 1.9–3.7)
Potassium: 4.4 mmol/L (ref 3.5–5.3)
Sodium: 141 mmol/L (ref 135–146)
TOTAL PROTEIN: 6.4 g/dL (ref 6.1–8.1)
Total Bilirubin: 0.9 mg/dL (ref 0.2–1.2)

## 2017-10-26 LAB — LIPID PANEL
CHOL/HDL RATIO: 2.2 (calc) (ref <5.0)
CHOLESTEROL: 137 mg/dL (ref <200)
HDL: 62 mg/dL (ref >50)
LDL CHOLESTEROL (CALC): 55 mg/dL
Non-HDL Cholesterol (Calc): 75 mg/dL (calc) (ref <130)
Triglycerides: 122 mg/dL (ref <150)

## 2017-10-26 LAB — TSH: TSH: 0.79 mIU/L (ref 0.40–4.50)

## 2017-11-03 ENCOUNTER — Ambulatory Visit (INDEPENDENT_AMBULATORY_CARE_PROVIDER_SITE_OTHER): Payer: Medicare HMO

## 2017-11-03 DIAGNOSIS — M85851 Other specified disorders of bone density and structure, right thigh: Secondary | ICD-10-CM

## 2017-11-03 DIAGNOSIS — Z780 Asymptomatic menopausal state: Secondary | ICD-10-CM | POA: Diagnosis not present

## 2017-11-03 DIAGNOSIS — Z1231 Encounter for screening mammogram for malignant neoplasm of breast: Secondary | ICD-10-CM

## 2017-11-08 ENCOUNTER — Ambulatory Visit: Payer: Medicare HMO | Admitting: Family Medicine

## 2017-11-08 NOTE — Progress Notes (Unsigned)
Subjective:    CC: Blood pressure and Pap smear.  HPI:  Hypertension- Pt denies chest pain, SOB, dizziness, or heart palpitations.  Taking meds as directed w/o problems.  Denies medication side effects.    Also like to have her Pap smear performed as she is overdue.  Pap smear was in 2017 which showed some atypical cells with negative for HPV.  Recommendation was to repeat in 1 year.  Past medical history, Surgical history, Family history not pertinant except as noted below, Social history, Allergies, and medications have been entered into the medical record, reviewed, and corrections made.   Review of Systems: No fevers, chills, night sweats, weight loss, chest pain, or shortness of breath.   Objective:    General: Well Developed, well nourished, and in no acute distress.  Neuro: Alert and oriented x3, extra-ocular muscles intact, sensation grossly intact.  HEENT: Normocephalic, atraumatic  Skin: Warm and dry, no rashes. Cardiac: Regular rate and rhythm, no murmurs rubs or gallops, no lower extremity edema.  Respiratory: Clear to auscultation bilaterally. Not using accessory muscles, speaking in full sentences. GU: No external vaginal lesions.  Cervix appears normal.  No abnormalities of the uterus.  Pap smear performed.   Impression and Recommendations:    HTN  -   Cervical cancer screening-Pap smear performed.  Will call with results once available.

## 2017-11-16 ENCOUNTER — Other Ambulatory Visit (HOSPITAL_COMMUNITY)
Admission: RE | Admit: 2017-11-16 | Discharge: 2017-11-16 | Disposition: A | Discharge status: Home / Self Care | Payer: Medicare HMO | Source: Ambulatory Visit | Attending: Family Medicine | Admitting: Family Medicine

## 2017-11-16 ENCOUNTER — Encounter: Payer: Self-pay | Admitting: Family Medicine

## 2017-11-16 ENCOUNTER — Other Ambulatory Visit: Payer: Self-pay | Admitting: Family Medicine

## 2017-11-16 ENCOUNTER — Ambulatory Visit (INDEPENDENT_AMBULATORY_CARE_PROVIDER_SITE_OTHER): Payer: Medicare HMO | Admitting: Family Medicine

## 2017-11-16 VITALS — BP 136/62 | HR 66 | Ht 61.0 in | Wt 190.0 lb

## 2017-11-16 DIAGNOSIS — Z124 Encounter for screening for malignant neoplasm of cervix: Secondary | ICD-10-CM

## 2017-11-16 DIAGNOSIS — Z0000 Encounter for general adult medical examination without abnormal findings: Secondary | ICD-10-CM | POA: Diagnosis not present

## 2017-11-16 MED ORDER — ALBUTEROL SULFATE HFA 108 (90 BASE) MCG/ACT IN AERS: 2.0000 | INHALATION_SPRAY | RESPIRATORY_TRACT | 1 refills | Status: DC | PRN

## 2017-11-16 NOTE — Patient Instructions (Signed)

## 2017-11-16 NOTE — Progress Notes (Signed)
Subjective:     Rachel Carr is a 67 y.o. female and is here for a comprehensive physical exam. The patient reports no problems.  He is doing well overall.  No regular exercise right now.  Social History   Socioeconomic History  . Marital status: Married    Spouse name: Not on file  . Number of children: 1  . Years of education: Not on file  . Highest education level: Not on file  Occupational History  . Not on file  Social Needs  . Financial resource strain: Not on file  . Food insecurity:    Worry: Not on file    Inability: Not on file  . Transportation needs:    Medical: Not on file    Non-medical: Not on file  Tobacco Use  . Smoking status: Former Smoker    Packs/day: 0.50    Years: 20.00    Pack years: 10.00  . Smokeless tobacco: Never Used  . Tobacco comment: uses vape  Substance and Sexual Activity  . Alcohol use: No  . Drug use: No  . 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  Lifestyle  . Physical activity:    Days per week: Not on file    Minutes per session: Not on file  . Stress: Not on file  Relationships  . Social connections:    Talks on phone: Not on file    Gets together: Not on file    Attends religious service: Not on file    Active member of club or organization: Not on file    Attends meetings of clubs or organizations: Not on file    Relationship status: Not on file  . Intimate partner violence:    Fear of current or ex partner: Not on file    Emotionally abused: Not on file    Physically abused: Not on file    Forced sexual activity: Not on file  Other Topics Concern  . Not on file  Social History Narrative  . Not on file   Health Maintenance  Topic Date Due  . PAP SMEAR  11/18/2016  . TETANUS/TDAP  10/26/2018 (Originally 08/10/2017)  . PNA vac Low Risk Adult (1 of 2 - PCV13) 09/29/2021 (Originally 11/22/2015)  . INFLUENZA VACCINE  03/10/2018  . MAMMOGRAM  11/04/2019  . COLONOSCOPY  05/10/2020  .  DEXA SCAN  11/04/2022  . Hepatitis C Screening  Completed    The following portions of the patient's history were reviewed and updated as appropriate: allergies, current medications, past family history, past medical history, past social history, past surgical history and problem list.  Review of Systems A comprehensive review of systems was negative.   Objective:    BP (!) 145/66   Pulse 66   Ht 5\' 1"  (1.549 m)   Wt 190 lb (86.2 kg)   SpO2 100%   BMI 35.90 kg/m  General appearance: alert, cooperative and appears stated age Head: Normocephalic, without obvious abnormality, atraumatic Eyes: conj clear, EOMI, PEERLA Ears: normal TM's and external ear canals both ears Nose: Nares normal. Septum midline. Mucosa normal. No drainage or sinus tenderness. Throat: lips, mucosa, and tongue normal; teeth and gums normal Neck: no adenopathy, no carotid bruit, no JVD, supple, symmetrical, trachea midline and thyroid not enlarged, symmetric, no tenderness/mass/nodules Back: symmetric, no curvature. ROM normal. No CVA tenderness. Lungs: clear to auscultation bilaterally Heart: regular rate and rhythm, S1, S2 normal, no murmur, click, rub or  gallop Abdomen: soft, non-tender; bowel sounds normal; no masses,  no organomegaly Pelvic: cervix normal in appearance, external genitalia normal, no adnexal masses or tenderness, no cervical motion tenderness, rectovaginal septum normal, uterus normal size, shape, and consistency and vagina normal without discharge Extremities: extremities normal, atraumatic, no cyanosis or edema Pulses: 2+ and symmetric Skin: Skin color, texture, turgor normal. No rashes or lesions Lymph nodes: Cervical, supraclavicular, and axillary nodes normal. Neurologic: Alert and oriented X 3, normal strength and tone. Normal symmetric reflexes. Normal coordination and gait    Assessment:    Healthy female exam.      Plan:     See After Visit Summary for Counseling  Recommendations   Keep up a regular exercise program and make sure you are eating a healthy diet Try to eat 4 servings of dairy a day, or if you are lactose intolerant take a calcium with vitamin D daily.  Your vaccines are up to date.

## 2017-11-19 LAB — CYTOLOGY - PAP
Diagnosis: NEGATIVE
HPV: NOT DETECTED

## 2017-11-19 NOTE — Progress Notes (Signed)
Call patient: Your Pap smear is normal. Repeat in 3 years, since the last one was abnormal.

## 2017-12-30 DIAGNOSIS — R69 Illness, unspecified: Secondary | ICD-10-CM | POA: Diagnosis not present

## 2018-03-28 ENCOUNTER — Emergency Department
Admission: EM | Admit: 2018-03-28 | Discharge: 2018-03-28 | Disposition: A | Discharge status: Home / Self Care | Payer: Medicare HMO | Source: Home / Self Care | Attending: Family Medicine | Admitting: Family Medicine

## 2018-03-28 ENCOUNTER — Other Ambulatory Visit: Payer: Self-pay

## 2018-03-28 DIAGNOSIS — M25561 Pain in right knee: Secondary | ICD-10-CM

## 2018-03-28 DIAGNOSIS — M25562 Pain in left knee: Secondary | ICD-10-CM | POA: Diagnosis not present

## 2018-03-28 DIAGNOSIS — M170 Bilateral primary osteoarthritis of knee: Secondary | ICD-10-CM | POA: Diagnosis not present

## 2018-03-28 MED ORDER — CELECOXIB 200 MG PO CAPS: ORAL_CAPSULE | ORAL | 2 refills | Status: DC

## 2018-03-28 NOTE — Consult Note (Signed)
Subjective:    CC: Bilateral knee pain  HPI: Rachel Carr is a pleasant 67 year old female, I saw her about a year ago, we did a right knee injection and she did extremely well until recently.  She is now having a recurrence of pain, moderate, persistent, localized to the medial joint line, stabbing sensations as well as occasional catching and locking.  Symptoms are moderate, persistent.  I reviewed the past medical history, family history, social history, surgical history, and allergies today and no changes were needed.  Please see the problem list section below in epic for further details.  Past Medical History: Past Medical History:  Diagnosis Date  . Barrett's syndrome   . CAD (coronary artery disease)   . Esophageal stricture   . Esophageal ulcer 09-2010   gastritis Dr. Glennon Hamilton  . Gastric polyps    colonoscopy 2012(due again for repeat 2017)  . Shingles    on the right eye   Past Surgical History: Past Surgical History:  Procedure Laterality Date  . ABDOMINAL HYSTERECTOMY     endometriosis  . CARPAL TUNNEL RELEASE    . CHOLECYSTECTOMY    . NISSEN FUNDOPLICATION  1478  . RADIOFREQUENCY ABLATION    . RT knee surgery     Social History: Social History   Socioeconomic History  . Marital status: Married    Spouse name: Not on file  . Number of children: 1  . Years of education: Not on file  . Highest education level: Not on file  Occupational History  . Not on file  Social Needs  . Financial resource strain: Not on file  . Food insecurity:    Worry: Not on file    Inability: Not on file  . Transportation needs:    Medical: Not on file    Non-medical: Not on file  Tobacco Use  . Smoking status: Former Smoker    Packs/day: 0.50    Years: 20.00    Pack years: 10.00  . Smokeless tobacco: Never Used  . Tobacco comment: uses vape  Substance and Sexual Activity  . Alcohol use: No  . Drug use: No  . 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  Lifestyle  . Physical activity:    Days per week: Not on file    Minutes per session: Not on file  . Stress: Not on file  Relationships  . Social connections:    Talks on phone: Not on file    Gets together: Not on file    Attends religious service: Not on file    Active member of club or organization: Not on file    Attends meetings of clubs or organizations: Not on file    Relationship status: Not on file  Other Topics Concern  . Not on file  Social History Narrative  . Not on file   Family History: Family History  Problem Relation Age of Onset  . Heart disease Mother   . Diabetes Father   . Cancer Father        prostrate  . Cancer Sister        esophagus  . Diabetes Sister   . Cancer Brother        kidney   Allergies: No Known Allergies Medications: See med rec.  Review of Systems: No fevers, chills, night sweats, weight loss, chest pain, or shortness of breath.   Objective:    General: Well Developed, well nourished, and in no acute  distress.  Neuro: Alert and oriented x3, extra-ocular muscles intact, sensation grossly intact.  HEENT: Normocephalic, atraumatic, pupils equal round reactive to light, neck supple, no masses, no lymphadenopathy, thyroid nonpalpable.  Skin: Warm and dry, no rashes. Cardiac: Regular rate and rhythm, no murmurs rubs or gallops, no lower extremity edema.  Respiratory: Clear to auscultation bilaterally. Not using accessory muscles, speaking in full sentences. Bilateral knees: Mild fullness with tenderness of both joint lines. ROM normal in flexion and extension and lower leg rotation. Ligaments with solid consistent endpoints including ACL, PCL, LCL, MCL. Negative Mcmurray's and provocative meniscal tests. Non painful patellar compression. Patellar and quadriceps tendons unremarkable. Hamstring and quadriceps strength is normal.  Procedure: Real-time Ultrasound Guided Injection of right knee Device: GE Logiq E  Verbal  informed consent obtained.  Time-out conducted.  Noted no overlying erythema, induration, or other signs of local infection.  Skin prepped in a sterile fashion.  Local anesthesia: Topical Ethyl chloride.  With sterile technique and under real time ultrasound guidance: 1 cc kenalog 40, 2 cc lidocaine, 2 cc bupivacaine injected easily. Completed without difficulty  Pain immediately resolved suggesting accurate placement of the medication.  Advised to call if fevers/chills, erythema, induration, drainage, or persistent bleeding.  Images permanently stored and available for review in the ultrasound unit.  Impression: Technically successful ultrasound guided injection.  Procedure: Real-time Ultrasound Guided Injection of left knee Device: GE Logiq E  Verbal informed consent obtained.  Time-out conducted.  Noted no overlying erythema, induration, or other signs of local infection.  Skin prepped in a sterile fashion.  Local anesthesia: Topical Ethyl chloride.  With sterile technique and under real time ultrasound guidance: 1 cc kenalog 40, 2 cc lidocaine, 2 cc bupivacaine injected easily. Completed without difficulty  Pain immediately resolved suggesting accurate placement of the medication.  Advised to call if fevers/chills, erythema, induration, drainage, or persistent bleeding.  Images permanently stored and available for review in the ultrasound unit.  Impression: Technically successful ultrasound guided injection.  Impression and Recommendations:    Primary osteoarthritis of both knees Did well after right knee injection approximately a year ago. Repeat injection, this time bilaterally. Return to see me in 1 month to evaluate response. If insufficient relief we can proceed with arthroscopy for removal of intra-articular loose bodies and then discuss Coolief radiofrequency ablation of the knee genicular nerves, she is not interested in arthroplasty. Adding Celebrex for breakthrough  pain.  ___________________________________________ Gwen Her. Dianah Field, M.D., ABFM., CAQSM. Primary Care and [place] Instructor of [place] [employer] [facility] [employer]

## 2018-03-28 NOTE — Assessment & Plan Note (Addendum)
Did well after right knee injection approximately a year ago. Repeat injection, this time bilaterally. Return to see me in 1 month to evaluate response. If insufficient relief we can proceed with arthroscopy for removal of intra-articular loose bodies and then discuss Coolief radiofrequency ablation of the knee genicular nerves, she is not interested in arthroplasty. Adding Celebrex for breakthrough pain.

## 2018-03-28 NOTE — Discharge Instructions (Signed)
°  Please take the medication as prescribed by Dr. Dianah Field. Be sure to schedule an appointment with him in 1 month to discuss additional treatment options for your knees even if you are pain free.  This way you can plan ahead and hopefully avoid having severe pain in the future.

## 2018-03-28 NOTE — ED Provider Notes (Signed)
[facility]    CSN: [id number] Arrival date & time: 03/28/18  1244     History   Chief Complaint Chief Complaint  Patient presents with  . Knee Pain    bilat    HPI Rachel Carr is a 67 y.o. female.   HPI Rachel Carr is a 67 y.o. female presenting to UC with c/o bilateral knee pain and swelling that started 3 days ago. No known injury but hx of knee pain about 1 year ago. She is requesting Dr. Dianah Field to come inject her knees if possible. She had her Right knee injected last year and pain has subsided until 3 days ago. She had to leave work early today due to pain. 9-10/10 in severity.     Past Medical History:  Diagnosis Date  . Barrett's syndrome   . CAD (coronary artery disease)   . Esophageal stricture   . Esophageal ulcer 09-2010   gastritis Dr. Glennon Hamilton  . Gastric polyps    colonoscopy 2012(due again for repeat 2017)  . Shingles    on the right eye    Patient Active Problem List   Diagnosis Date Noted  . Primary osteoarthritis of both knees 11/26/2016  . CAD S/P percutaneous coronary angioplasty 09/29/2016  . Unstable angina ([place]) 02/27/2016  . Chest pain 02/25/2016  . IFG (impaired fasting glucose) 11/20/2015  . 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 Regurgitation 12/10/2011  . Obese 10/14/2011  . Barrett's esophagus 01/13/2011  . Tobacco use disorder 01/02/2011  . COLONIC POLYPS 10/10/2010  . ULCER OF ESOPHAGUS WITHOUT BLEEDING 10/10/2010  . GERD 08/15/2010  . 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 ABDOMINAL HYSTERECTOMY     endometriosis  . CARPAL TUNNEL RELEASE    . CHOLECYSTECTOMY    . NISSEN FUNDOPLICATION  3419  . RADIOFREQUENCY ABLATION    . RT knee surgery      OB History   None      Home Medications    Prior to Admission  medications   Medication Sig Start Date End Date Taking? Authorizing Provider  albuterol (PROVENTIL HFA;VENTOLIN HFA) 108 (90 Base) MCG/ACT inhaler Inhale 2 puffs into the lungs every 4 (four) hours as needed for wheezing (cough, shortness of breath or wheezing.). 11/16/17   Hali Marry, MD  atorvastatin (LIPITOR) 80 MG tablet TAKE ONE TABLET (80 MG TOTAL) BY MOUTH AT BEDTIME. 04/02/17   Hali Marry, MD  carvedilol (COREG) 6.25 MG tablet Take 1 tablet (6.25 mg total) by mouth 2 (two) times daily with a meal. 09/15/17   Crenshaw, Denice Bors, MD  celecoxib (CELEBREX) 200 MG capsule One to 2 tablets by mouth daily as needed for pain. 03/28/18   Silverio Decamp, MD  [facility] ASPIRIN 81 MG chewable tablet Chew 1 tablet by mouth. 02/28/16   [provider]  GARLIC PO Take 3,790 mg by mouth daily.    [provider]  Misc Natural Products (OSTEO BI-FLEX JOINT SHIELD PO) Take by mouth 2 (two) times daily.    [provider]  nitroGLYCERIN (NITROSTAT) 0.4 MG SL tablet PLACE ONE TABLET (0.4 MG TOTAL) UNDER THE TONGUE EVERY 5 (FIVE) MINUTES AS NEEDED FOR CHEST PAIN. 04/02/17   Hali Marry, MD    Family History Family History  Problem Relation Age of Onset  . Heart disease  Mother   . Diabetes Father   . Cancer Father        prostrate  . Cancer Sister        esophagus  . Diabetes Sister   . Cancer Brother        kidney    Social History Social History   Tobacco Use  . Smoking status: Former Smoker    Packs/day: 0.50    Years: 20.00    Pack years: 10.00  . Smokeless tobacco: Never Used  . Tobacco comment: uses vape  Substance Use Topics  . Alcohol use: No  . Drug use: No     Allergies   Patient has no known allergies.   Review of Systems Review of Systems  Musculoskeletal: Positive for arthralgias, gait problem ( due to pain) and joint swelling.  Neurological: Negative for weakness and numbness.     Physical Exam Triage  Vital Signs ED Triage Vitals  Enc Vitals Group     BP 03/28/18 1312 (!) 153/101     Pulse Rate 03/28/18 1312 70     Resp --      Temp 03/28/18 1312 (!) 97.3 F (36.3 C)     Temp Source 03/28/18 1312 Oral     SpO2 03/28/18 1312 99 %     Weight 03/28/18 1313 186 lb (84.4 kg)     Height 03/28/18 1313 5\' 2"  (1.575 m)     Head Circumference --      Peak Flow --      Pain Score 03/28/18 1312 10     Pain Loc --      Pain Edu? --      Excl. in [place]? --    No data found.  Updated Vital Signs BP (!) 153/101 (BP Location: Right Arm)   Pulse 70   Temp (!) 97.3 F (36.3 C) (Oral)   Ht 5\' 2"  (1.575 m)   Wt 186 lb (84.4 kg)   SpO2 99%   BMI 34.02 kg/m   Visual Acuity Right Eye Distance:   Left Eye Distance:   Bilateral Distance:    Right Eye Near:   Left Eye Near:    Bilateral Near:     Physical Exam  Constitutional: She appears well-developed and well-nourished.  Neck: Normal range of motion.  Cardiovascular: Normal rate.  Musculoskeletal: Normal range of motion. She exhibits edema and tenderness.  Bilateral knees: mild to moderate edema. Diffuse tenderness. Antalgic gait  Skin: Skin is warm and dry. No erythema.     UC Treatments / Results  Labs (all labs ordered are listed, but only abnormal results are displayed) Labs Reviewed - No data to display  EKG None  Radiology No results found.  Procedures Procedures (including critical care time)  Medications Ordered in UC Medications - No data to display  Initial Impression / Assessment and Plan / UC Course  I have reviewed the triage vital signs and the nursing notes.  Pertinent labs & imaging results that were available during my care of the patient were reviewed by me and considered in my medical decision making (see chart for details).     Consulted with Dr. Dianah Field who also examined the pt. He was able to give injections in both knees, see consult note. Pt to f/u in 1 month.  Final Clinical  Impressions(s) / UC Diagnoses   Final diagnoses:  Pain in both knees, unspecified chronicity     Discharge Instructions      Please take the  medication as prescribed by Dr. Dianah Field. Be sure to schedule an appointment with him in 1 month to discuss additional treatment options for your knees even if you are pain free.  This way you can plan ahead and hopefully avoid having severe pain in the future.      ED Prescriptions    Medication Sig Dispense Auth. Provider   celecoxib (CELEBREX) 200 MG capsule One to 2 tablets by mouth daily as needed for pain. 60 capsule Silverio Decamp, MD     Controlled Substance Prescriptions [place] Controlled Substance Registry consulted? Not Applicable   Tyrell Antonio 03/28/18 1427

## 2018-03-28 NOTE — ED Triage Notes (Signed)
Pt has been having bilat knee pain x 3 days.  Last night increased, and today almost unable to walk.

## 2018-03-31 ENCOUNTER — Other Ambulatory Visit: Payer: Self-pay | Admitting: Family Medicine

## 2018-04-14 ENCOUNTER — Encounter: Payer: Self-pay | Admitting: Family Medicine

## 2018-04-14 ENCOUNTER — Ambulatory Visit (INDEPENDENT_AMBULATORY_CARE_PROVIDER_SITE_OTHER): Payer: Medicare HMO | Admitting: Family Medicine

## 2018-04-14 VITALS — BP 121/72 | HR 75 | Ht 61.0 in | Wt 184.0 lb

## 2018-04-14 DIAGNOSIS — M25562 Pain in left knee: Secondary | ICD-10-CM

## 2018-04-14 DIAGNOSIS — R7301 Impaired fasting glucose: Secondary | ICD-10-CM | POA: Diagnosis not present

## 2018-04-14 DIAGNOSIS — J0100 Acute maxillary sinusitis, unspecified: Secondary | ICD-10-CM

## 2018-04-14 DIAGNOSIS — M25561 Pain in right knee: Secondary | ICD-10-CM | POA: Diagnosis not present

## 2018-04-14 DIAGNOSIS — G8929 Other chronic pain: Secondary | ICD-10-CM

## 2018-04-14 LAB — POCT GLYCOSYLATED HEMOGLOBIN (HGB A1C): HEMOGLOBIN A1C: 6.1 % — AB (ref 4.0–5.6)

## 2018-04-14 MED ORDER — AMOXICILLIN-POT CLAVULANATE 875-125 MG PO TABS: 1.0000 | ORAL_TABLET | Freq: Two times a day (BID) | ORAL | 0 refills | Status: DC

## 2018-04-14 NOTE — Progress Notes (Signed)
Subjective:    Patient ID: Rachel Carr, female    DOB: 01/19/1951, 67 y.o.   MRN: [id number]  HPI Impaired fasting glucose-no increased thirst or urination. No symptoms consistent with hypoglycemia.  She also complains of some sinus symptoms with nasal congestion and yellow mucus for days.  She has been using her nasal spray regularly which I believe is a nasal steroid spray in addition to her Nettie pot.  She has a lot of pressure over both the maxillary sinuses.  She feels like it is actually getting worse.  No fevers chills or sweats.  She has had a mild sore throat but mostly from excess postnasal drip.  Complains of bilateral knee pain.  She is Artie scheduled an appointment for 1 of our sports medicine docs in the next couple of weeks.  But she is had persistent pain.  She actually had arthroscopy done on her right knee back in the 1990s but is never had any treatment on her left knee.  They do swell intermittently.   Review of Systems  BP 121/72   Pulse 75   Ht 5\' 1"  (1.549 m)   Wt 184 lb (83.5 kg)   SpO2 100%   BMI 34.77 kg/m     No Known Allergies  Past Medical History:  Diagnosis Date  . Barrett's syndrome   . CAD (coronary artery disease)   . Esophageal stricture   . Esophageal ulcer 09-2010   gastritis Dr. Glennon Hamilton  . Gastric polyps    colonoscopy 2012(due again for repeat 2017)  . Shingles    on the right eye    Past Surgical History:  Procedure Laterality Date  . ABDOMINAL HYSTERECTOMY     endometriosis  . CARPAL TUNNEL RELEASE    . CHOLECYSTECTOMY    . NISSEN FUNDOPLICATION  1448  . RADIOFREQUENCY ABLATION    . RT knee surgery      Social History   Socioeconomic History  . Marital status: Married    Spouse name: Not on file  . Number of children: 1  . Years of education: Not on file  . Highest education level: Not on file  Occupational History  . Not on file  Social Needs  . Financial resource strain: Not on file  . Food insecurity:   Worry: Not on file    Inability: Not on file  . Transportation needs:    Medical: Not on file    Non-medical: Not on file  Tobacco Use  . Smoking status: Former Smoker    Packs/day: 0.50    Years: 20.00    Pack years: 10.00  . Smokeless tobacco: Never Used  . Tobacco comment: uses vape  Substance and Sexual Activity  . Alcohol use: No  . Drug use: No  . 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  Lifestyle  . Physical activity:    Days per week: Not on file    Minutes per session: Not on file  . Stress: Not on file  Relationships  . Social connections:    Talks on phone: Not on file    Gets together: Not on file    Attends religious service: Not on file    Active member of club or organization: Not on file    Attends meetings of clubs or organizations: Not on file    Relationship status: Not on file  . Intimate partner violence:    Fear of current or ex partner:  Not on file    Emotionally abused: Not on file    Physically abused: Not on file    Forced sexual activity: Not on file  Other Topics Concern  . Not on file  Social History Narrative  . Not on file    Family History  Problem Relation Age of Onset  . Heart disease Mother   . Diabetes Father   . Cancer Father        prostrate  . Cancer Sister        esophagus  . Diabetes Sister   . Cancer Brother        kidney    Outpatient Encounter Medications as of 04/14/2018  Medication Sig  . albuterol (PROVENTIL HFA;VENTOLIN HFA) 108 (90 Base) MCG/ACT inhaler Inhale 2 puffs into the lungs every 4 (four) hours as needed for wheezing (cough, shortness of breath or wheezing.).  Marland Kitchen atorvastatin (LIPITOR) 80 MG tablet TAKE ONE TABLET (80 MG TOTAL) BY MOUTH AT BEDTIME.  . carvedilol (COREG) 6.25 MG tablet Take 1 tablet (6.25 mg total) by mouth 2 (two) times daily with a meal.  . celecoxib (CELEBREX) 200 MG capsule One to 2 tablets by mouth daily as needed for pain.  . [facility] ASPIRIN  81 MG chewable tablet Chew 1 tablet by mouth.  Marland Kitchen GARLIC PO Take 4,097 mg by mouth daily.  . Misc Natural Products (OSTEO BI-FLEX JOINT SHIELD PO) Take by mouth 2 (two) times daily.  . nitroGLYCERIN (NITROSTAT) 0.4 MG SL tablet PLACE ONE TABLET (0.4 MG TOTAL) UNDER THE TONGUE EVERY 5 (FIVE) MINUTES AS NEEDED FOR CHEST PAIN.   No facility-administered encounter medications on file as of 04/14/2018.           Objective:   Physical Exam  Constitutional: She is oriented to person, place, and time. She appears well-developed and well-nourished.  HENT:  Head: Normocephalic and atraumatic.  Right Ear: External ear normal.  Left Ear: External ear normal.  Nose: Nose normal.  Mouth/Throat: Oropharynx is clear and moist.  TMs and canals are clear.   Eyes: Pupils are equal, round, and reactive to light. Conjunctivae and EOM are normal.  Neck: Neck supple. No thyromegaly present.  Cardiovascular: Normal rate, regular rhythm and normal heart sounds.  Pulmonary/Chest: Effort normal and breath sounds normal. She has no wheezes.  Lymphadenopathy:    She has no cervical adenopathy.  Neurological: She is alert and oriented to person, place, and time.  Skin: Skin is warm and dry.  Psychiatric: She has a normal mood and affect.        Assessment & Plan:  IFG -A1c is 6.1 today up just a little from previous we will continue to monitor this.  Continue to work on healthy diet and regular exercise and recheck in 6 months.  Acute sinusitis-viral versus bacterial.  Still little bit early.  Since we are getting ready to head to the weekend to give her prescription to fill only if she feels her symptoms are progressing or if they are not better in another 3 to 4 days.  Bilateral knee pain-likely secondary to osteoarthritis-she does have an appointment coming up and go ahead and order x-rays so that those will be ready for her appointment.

## 2018-04-14 NOTE — Patient Instructions (Signed)

## 2018-04-21 ENCOUNTER — Ambulatory Visit (INDEPENDENT_AMBULATORY_CARE_PROVIDER_SITE_OTHER): Payer: Medicare HMO

## 2018-04-21 DIAGNOSIS — M1712 Unilateral primary osteoarthritis, left knee: Secondary | ICD-10-CM | POA: Diagnosis not present

## 2018-04-21 DIAGNOSIS — M170 Bilateral primary osteoarthritis of knee: Secondary | ICD-10-CM

## 2018-04-21 DIAGNOSIS — G8929 Other chronic pain: Secondary | ICD-10-CM

## 2018-04-21 DIAGNOSIS — M25561 Pain in right knee: Principal | ICD-10-CM

## 2018-04-21 DIAGNOSIS — M1711 Unilateral primary osteoarthritis, right knee: Secondary | ICD-10-CM | POA: Diagnosis not present

## 2018-04-21 DIAGNOSIS — M25562 Pain in left knee: Principal | ICD-10-CM

## 2018-04-28 ENCOUNTER — Ambulatory Visit (INDEPENDENT_AMBULATORY_CARE_PROVIDER_SITE_OTHER): Payer: Medicare HMO | Admitting: Sports Medicine

## 2018-04-28 ENCOUNTER — Encounter: Payer: Self-pay | Admitting: Sports Medicine

## 2018-04-28 DIAGNOSIS — M170 Bilateral primary osteoarthritis of knee: Secondary | ICD-10-CM | POA: Diagnosis not present

## 2018-04-28 MED ORDER — TRAMADOL HCL 50 MG PO TABS
50.0000 mg | ORAL_TABLET | Freq: Three times a day (TID) | ORAL | 0 refills | Status: DC | PRN
Start: 2018-04-28 — End: 2018-09-02

## 2018-04-28 NOTE — Assessment & Plan Note (Signed)
Stage knee osteoarthritis. She is had several steroid injections, the most recent of which was about a month ago. She has had Visco supplementation, therapy, no improvement. Celebrex not effective. Adding some tramadol, and because she is not interested in arthroplasty I will set her up with a [facility] provider.

## 2018-04-28 NOTE — Progress Notes (Signed)
Subjective:    CC: Recheck knee pain  HPI: This is a very pleasant 67 year old female, she has known bilateral osteoarthritis, knees, end-stage.  Steroid injections were working for some time but more recently the last injection done under ultrasound guidance 1 month ago has only provided a couple of weeks of relief.  She has had Visco supplementation, physical therapy, oral medications in the form of Celebrex without any improvement.  She does not have any mechanical symptoms, she is not interested in arthroplasty.  I reviewed the past medical history, family history, social history, surgical history, and allergies today and no changes were needed.  Please see the problem list section below in epic for further details.  Past Medical History: Past Medical History:  Diagnosis Date  . Barrett's syndrome   . CAD (coronary artery disease)   . Esophageal stricture   . Esophageal ulcer 09-2010   gastritis Dr. Glennon Hamilton  . Gastric polyps    colonoscopy 2012(due again for repeat 2017)  . Shingles    on the right eye   Past Surgical History: Past Surgical History:  Procedure Laterality Date  . ABDOMINAL HYSTERECTOMY     endometriosis  . CARPAL TUNNEL RELEASE    . CHOLECYSTECTOMY    . NISSEN FUNDOPLICATION  3299  . RADIOFREQUENCY ABLATION    . RT knee surgery     Social History: Social History   Socioeconomic History  . Marital status: Married    Spouse name: Not on file  . Number of children: 1  . Years of education: Not on file  . Highest education level: Not on file  Occupational History  . Not on file  Social Needs  . Financial resource strain: Not on file  . Food insecurity:    Worry: Not on file    Inability: Not on file  . Transportation needs:    Medical: Not on file    Non-medical: Not on file  Tobacco Use  . Smoking status: Former Smoker    Packs/day: 0.50    Years: 20.00    Pack years: 10.00  . Smokeless tobacco: Never Used  . Tobacco comment: uses vape    Substance and Sexual Activity  . Alcohol use: No  . Drug use: No  . 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  Lifestyle  . Physical activity:    Days per week: Not on file    Minutes per session: Not on file  . Stress: Not on file  Relationships  . Social connections:    Talks on phone: Not on file    Gets together: Not on file    Attends religious service: Not on file    Active member of club or organization: Not on file    Attends meetings of clubs or organizations: Not on file    Relationship status: Not on file  Other Topics Concern  . Not on file  Social History Narrative  . Not on file   Family History: Family History  Problem Relation Age of Onset  . Heart disease Mother   . Diabetes Father   . Cancer Father        prostrate  . Cancer Sister        esophagus  . Diabetes Sister   . Cancer Brother        kidney   Allergies: No Known Allergies Medications: See med rec.  Review of Systems: No fevers, chills, night sweats, weight loss, chest pain,  or shortness of breath.   Objective:    General: Well Developed, well nourished, and in no acute distress.  Neuro: Alert and oriented x3, extra-ocular muscles intact, sensation grossly intact.  HEENT: Normocephalic, atraumatic, pupils equal round reactive to light, neck supple, no masses, no lymphadenopathy, thyroid nonpalpable.  Skin: Warm and dry, no rashes. Cardiac: Regular rate and rhythm, no murmurs rubs or gallops, no lower extremity edema.  Respiratory: Clear to auscultation bilaterally. Not using accessory muscles, speaking in full sentences. Bilateral knees: Bilateral varus deformity, minimal tenderness of the medial joint line and fullness in both knees. ROM normal in flexion and extension and lower leg rotation. Ligaments with solid consistent endpoints including ACL, PCL, LCL, MCL. Negative Mcmurray's and provocative meniscal tests. Non painful patellar  compression. Patellar and quadriceps tendons unremarkable. Hamstring and quadriceps strength is normal.  X-rays personally reviewed, she does have a bilateral varus formerly with bilateral end-stage bone-on-bone knee osteoarthritis.  Impression and Recommendations:    Primary osteoarthritis of both knees Stage knee osteoarthritis. She is had several steroid injections, the most recent of which was about a month ago. She has had Visco supplementation, therapy, no improvement. Celebrex not effective. Adding some tramadol, and because she is not interested in arthroplasty I will set her up with a [facility] provider.  I spent 25 minutes with this patient, greater than 50% was face-to-face time counseling regarding the above diagnoses, specifically the mechanism of [facility] genicular nerve radiofrequency ablation. ___________________________________________ Gwen Her. Dianah Field, M.D., ABFM., CAQSM. Primary Care and [place] Instructor of [place] [facility]

## 2018-05-20 DIAGNOSIS — M170 Bilateral primary osteoarthritis of knee: Secondary | ICD-10-CM | POA: Diagnosis not present

## 2018-06-17 DIAGNOSIS — M170 Bilateral primary osteoarthritis of knee: Secondary | ICD-10-CM | POA: Diagnosis not present

## 2018-07-02 ENCOUNTER — Other Ambulatory Visit: Payer: Self-pay

## 2018-07-02 ENCOUNTER — Emergency Department
Admission: EM | Admit: 2018-07-02 | Discharge: 2018-07-02 | Disposition: A | Discharge status: Home / Self Care | Payer: Medicare HMO | Source: Home / Self Care | Attending: Family Medicine | Admitting: Family Medicine

## 2018-07-02 DIAGNOSIS — J9801 Acute bronchospasm: Secondary | ICD-10-CM

## 2018-07-02 DIAGNOSIS — B9789 Other viral agents as the cause of diseases classified elsewhere: Secondary | ICD-10-CM | POA: Diagnosis not present

## 2018-07-02 DIAGNOSIS — J069 Acute upper respiratory infection, unspecified: Secondary | ICD-10-CM | POA: Diagnosis not present

## 2018-07-02 MED ORDER — DOXYCYCLINE HYCLATE 100 MG PO CAPS: 100.0000 mg | ORAL_CAPSULE | Freq: Two times a day (BID) | ORAL | 0 refills | Status: DC

## 2018-07-02 MED ORDER — METHYLPREDNISOLONE ACETATE 80 MG/ML IJ SUSP
80.0000 mg | Freq: Once | INTRAMUSCULAR | Status: AC
  Administered 2018-07-02: 80 mg via INTRAMUSCULAR

## 2018-07-02 MED ORDER — BENZONATATE 200 MG PO CAPS: ORAL_CAPSULE | ORAL | 0 refills | Status: DC

## 2018-07-02 NOTE — Discharge Instructions (Addendum)
Take plain guaifenesin (1200mg  extended release tabs such as Mucinex) twice daily, with plenty of water, for cough and congestion.  May add Pseudoephedrine (30mg , one or two every 4 to 6 hours) for sinus congestion.  Get adequate rest.   May use Afrin nasal spray (or generic oxymetazoline) each morning for about 5 days and then discontinue.  Also recommend using saline nasal spray several times daily and saline nasal irrigation (AYR is a common brand).  Try warm salt water gargles for sore throat.  Stop all antihistamines for now, and other non-prescription cough/cold preparations.

## 2018-07-02 NOTE — ED Provider Notes (Signed)
[facility]    CSN: [id number] Arrival date & time: 07/02/18  0913     History   Chief Complaint Chief Complaint  Patient presents with  . Nasal Congestion  . Headache    HPI Rachel Carr is a 67 y.o. female.   Six days ago patient developed typical cold-like symptoms developing over several days, including mild sore throat, sinus congestion, headache, fatigue, and cough.  She now feels tight in her chest, and her cough is nonproductive.  Over the past 2 days she has developed bilateral facial pain and pressure.  She has a history of recurring sinusitis and bronchitis.  The history is provided by the patient.    Past Medical History:  Diagnosis Date  . Barrett's syndrome   . CAD (coronary artery disease)   . Esophageal stricture   . Esophageal ulcer 09-2010   gastritis Dr. Glennon Hamilton  . Gastric polyps    colonoscopy 2012(due again for repeat 2017)  . Shingles    on the right eye    Patient Active Problem List   Diagnosis Date Noted  . Primary osteoarthritis of both knees 11/26/2016  . CAD S/P percutaneous coronary angioplasty 09/29/2016  . Unstable angina ([place]) 02/27/2016  . Chest pain 02/25/2016  . IFG (impaired fasting glucose) 11/20/2015  . 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 Regurgitation 12/10/2011  . Obese 10/14/2011  . Barrett's esophagus 01/13/2011  . Tobacco use disorder 01/02/2011  . COLONIC POLYPS 10/10/2010  . ULCER OF ESOPHAGUS WITHOUT BLEEDING 10/10/2010  . GERD 08/15/2010  . 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 ABDOMINAL HYSTERECTOMY     endometriosis  . CARPAL TUNNEL RELEASE    . CHOLECYSTECTOMY    . NISSEN FUNDOPLICATION  9767  . RADIOFREQUENCY ABLATION    . RT knee surgery      OB History   None      Home Medications    Prior to  Admission medications   Medication Sig Start Date End Date Taking? Authorizing Provider  albuterol (PROVENTIL HFA;VENTOLIN HFA) 108 (90 Base) MCG/ACT inhaler Inhale 2 puffs into the lungs every 4 (four) hours as needed for wheezing (cough, shortness of breath or wheezing.). 11/16/17   Hali Marry, MD  atorvastatin (LIPITOR) 80 MG tablet TAKE ONE TABLET (80 MG TOTAL) BY MOUTH AT BEDTIME. 03/31/18   Hali Marry, MD  benzonatate (TESSALON) 200 MG capsule Take one cap by mouth at bedtime as needed for cough.  May repeat in 4 to 6 hours 07/02/18   Kandra Nicolas, MD  carvedilol (COREG) 6.25 MG tablet Take 1 tablet (6.25 mg total) by mouth 2 (two) times daily with a meal. 09/15/17   Lelon Perla, MD  [facility] ASPIRIN 81 MG chewable tablet Chew 1 tablet by mouth. 02/28/16   [provider]  doxycycline (VIBRAMYCIN) 100 MG capsule Take 1 capsule (100 mg total) by mouth 2 (two) times daily. Take with food. 07/02/18   Kandra Nicolas, MD  GARLIC PO Take 3,419 mg by mouth daily.    [provider]  Misc Natural Products (OSTEO BI-FLEX JOINT SHIELD PO) Take by mouth 2 (two) times daily.    [provider]  nitroGLYCERIN (NITROSTAT) 0.4 MG SL tablet PLACE ONE TABLET (0.4 MG TOTAL) UNDER THE TONGUE EVERY 5 (FIVE) MINUTES AS NEEDED FOR CHEST PAIN.  04/02/17   Hali Marry, MD  traMADol (ULTRAM) 50 MG tablet Take 1 tablet (50 mg total) by mouth every 8 (eight) hours as needed for moderate pain. Maximum 6 tabs per day. 04/28/18   Silverio Decamp, MD    Family History Family History  Problem Relation Age of Onset  . Heart disease Mother   . Diabetes Father   . Cancer Father        prostrate  . Cancer Sister        esophagus  . Diabetes Sister   . Cancer Brother        kidney    Social History Social History   Tobacco Use  . Smoking status: Former Smoker    Packs/day: 0.50    Years: 20.00    Pack years: 10.00  . Smokeless tobacco:  Never Used  . Tobacco comment: Quit in 2017  Substance Use Topics  . Alcohol use: No  . Drug use: No     Allergies   Patient has no known allergies.   Review of Systems Review of Systems + sore throat + cough No pleuritic pain, but feels tight in chest No wheezing + nasal congestion + post-nasal drainage No sinus pain/pressure No itchy/red eyes No earache No hemoptysis No SOB No fever/chills No nausea No vomiting No abdominal pain No diarrhea No urinary symptoms No skin rash + fatigue No myalgias + headache Used OTC meds without relief   Physical Exam Triage Vital Signs ED Triage Vitals  Enc Vitals Group     BP 07/02/18 0930 102/71     Pulse Rate 07/02/18 0930 90     Resp 07/02/18 0930 18     Temp 07/02/18 0930 97.7 F (36.5 C)     Temp Source 07/02/18 0930 Oral     SpO2 07/02/18 0930 98 %     Weight 07/02/18 0931 179 lb (81.2 kg)     Height 07/02/18 0931 5\' 1"  (1.549 m)     Head Circumference --      Peak Flow --      Pain Score 07/02/18 0931 9     Pain Loc --      Pain Edu? --      Excl. in [place]? --    No data found.  Updated Vital Signs BP 102/71 (BP Location: Right Arm)   Pulse 90   Temp 97.7 F (36.5 C) (Oral)   Resp 18   Ht 5\' 1"  (1.549 m)   Wt 81.2 kg   SpO2 98%   BMI 33.82 kg/m   Visual Acuity Right Eye Distance:   Left Eye Distance:   Bilateral Distance:    Right Eye Near:   Left Eye Near:    Bilateral Near:     Physical Exam Nursing notes and Vital Signs reviewed. Appearance:  Patient appears stated age, and in no acute distress Eyes:  Pupils are equal, round, and reactive to light and accomodation.  Extraocular movement is intact.  Conjunctivae are not inflamed  Ears:  Canals normal.  Tympanic membranes normal.  Nose:  Mildly congested turbinates.  No sinus tenderness.  Pharynx:  Normal Neck:  Supple.  Enlarged nontender posterior/lateral nodes are palpated bilaterally.  Lungs:   Faint bilateral scattered wheezes.   Breath sounds are equal.  Moving air well. Heart:  Regular rate and rhythm without murmurs, rubs, or gallops.  Abdomen:  Nontender without masses or hepatosplenomegaly.  Bowel sounds are present.  No CVA or flank tenderness.  Extremities:  No edema.  Skin:  No rash present.    UC Treatments / Results  Labs (all labs ordered are listed, but only abnormal results are displayed) Labs Reviewed - No data to display  EKG None  Radiology No results found.  Procedures Procedures (including critical care time)  Medications Ordered in UC Medications  methylPREDNISolone acetate (DEPO-MEDROL) injection 80 mg (has no administration in time range)    Initial Impression / Assessment and Plan / UC Course  I have reviewed the triage vital signs and the nursing notes.  Pertinent labs & imaging results that were available during my care of the patient were reviewed by me and considered in my medical decision making (see chart for details).    Patient has a history of sinusitis and bronchitis, and mild reactive airways symptoms during viral URI.  Administered Depo Medrol 80mg  IM, and begin empiric doxycycline.  Patient reports that she does not like the effects of oral prednisone. Followup with Family Doctor if not improved in 7 to 10 days.  Final Clinical Impressions(s) / UC Diagnoses   Final diagnoses:  Viral URI with cough  Bronchospasm, acute     Discharge Instructions     Take plain guaifenesin (1200mg  extended release tabs such as Mucinex) twice daily, with plenty of water, for cough and congestion.  May add Pseudoephedrine (30mg , one or two every 4 to 6 hours) for sinus congestion.  Get adequate rest.   May use Afrin nasal spray (or generic oxymetazoline) each morning for about 5 days and then discontinue.  Also recommend using saline nasal spray several times daily and saline nasal irrigation (AYR is a common brand).  Try warm salt water gargles for sore throat.  Stop all  antihistamines for now, and other non-prescription cough/cold preparations.        ED Prescriptions    Medication Sig Dispense Auth. Provider   doxycycline (VIBRAMYCIN) 100 MG capsule Take 1 capsule (100 mg total) by mouth 2 (two) times daily. Take with food. 14 capsule Kandra Nicolas, MD   benzonatate (TESSALON) 200 MG capsule Take one cap by mouth at bedtime as needed for cough.  May repeat in 4 to 6 hours 15 capsule Assunta Found Ishmael Holter, MD         Kandra Nicolas, MD 07/02/18 1030

## 2018-07-02 NOTE — ED Triage Notes (Signed)
Pt c/o cold like sxs since Monday. Hacking cough with some tightness in chest. Also c/o headache. Taking Alkaseltzer, Dayquil and Nyquil prn.

## 2018-07-22 DIAGNOSIS — E785 Hyperlipidemia, unspecified: Secondary | ICD-10-CM | POA: Diagnosis not present

## 2018-07-22 DIAGNOSIS — E669 Obesity, unspecified: Secondary | ICD-10-CM | POA: Diagnosis not present

## 2018-07-22 DIAGNOSIS — Z833 Family history of diabetes mellitus: Secondary | ICD-10-CM | POA: Diagnosis not present

## 2018-07-22 DIAGNOSIS — M1990 Unspecified osteoarthritis, unspecified site: Secondary | ICD-10-CM | POA: Diagnosis not present

## 2018-07-22 DIAGNOSIS — I2510 Atherosclerotic heart disease of native coronary artery without angina pectoris: Secondary | ICD-10-CM | POA: Diagnosis not present

## 2018-07-22 DIAGNOSIS — Z809 Family history of malignant neoplasm, unspecified: Secondary | ICD-10-CM | POA: Diagnosis not present

## 2018-07-22 DIAGNOSIS — Z6833 Body mass index (BMI) 33.0-33.9, adult: Secondary | ICD-10-CM | POA: Diagnosis not present

## 2018-07-22 DIAGNOSIS — Z87891 Personal history of nicotine dependence: Secondary | ICD-10-CM | POA: Diagnosis not present

## 2018-07-22 DIAGNOSIS — Z825 Family history of asthma and other chronic lower respiratory diseases: Secondary | ICD-10-CM | POA: Diagnosis not present

## 2018-09-02 ENCOUNTER — Ambulatory Visit (INDEPENDENT_AMBULATORY_CARE_PROVIDER_SITE_OTHER): Payer: Medicare HMO | Admitting: Family Medicine

## 2018-09-02 ENCOUNTER — Encounter: Payer: Self-pay | Admitting: Family Medicine

## 2018-09-02 VITALS — BP 122/63 | HR 75 | Ht 61.0 in | Wt 177.0 lb

## 2018-09-02 DIAGNOSIS — Z9861 Coronary angioplasty status: Secondary | ICD-10-CM

## 2018-09-02 DIAGNOSIS — Z6834 Body mass index (BMI) 34.0-34.9, adult: Secondary | ICD-10-CM | POA: Diagnosis not present

## 2018-09-02 DIAGNOSIS — M25561 Pain in right knee: Secondary | ICD-10-CM

## 2018-09-02 DIAGNOSIS — G8929 Other chronic pain: Secondary | ICD-10-CM | POA: Diagnosis not present

## 2018-09-02 DIAGNOSIS — I2510 Atherosclerotic heart disease of native coronary artery without angina pectoris: Secondary | ICD-10-CM | POA: Diagnosis not present

## 2018-09-02 DIAGNOSIS — R7301 Impaired fasting glucose: Secondary | ICD-10-CM

## 2018-09-02 DIAGNOSIS — M25562 Pain in left knee: Secondary | ICD-10-CM

## 2018-09-02 LAB — POCT GLYCOSYLATED HEMOGLOBIN (HGB A1C): Hemoglobin A1C: 6.3 % — AB (ref 4.0–5.6)

## 2018-09-02 MED ORDER — METFORMIN HCL ER 500 MG PO TB24: 500.0000 mg | ORAL_TABLET | Freq: Every day | ORAL | 5 refills | Status: DC

## 2018-09-02 MED ORDER — DULOXETINE HCL 30 MG PO CPEP: ORAL_CAPSULE | ORAL | 6 refills | Status: DC

## 2018-09-02 NOTE — Patient Instructions (Signed)
Go for your labs in March. Make sure you are fasting.  Great work on the [employer] loss you are doing fantastic.  Keep it up!

## 2018-09-02 NOTE — Progress Notes (Signed)
Subjective:    CC: glucose.   HPI:  Impaired fasting glucose-no increased thirst or urination. No symptoms consistent with hypoglycemia.  She has been followed by Dr. Park Breed at pain management.  He put her on Cymbalta 30 mg for bilateral primary osteoarthritis of the knees for pain control.  She says actually has been effective and has been working well and because he is now out of her network she wants to know if I would be willing to take over that prescription.  F/U CAD  -chest pain or shortness of breath.  Currently on Lipitor 80 mg daily.  Past medical history, Surgical history, Family history not pertinant except as noted below, Social history, Allergies, and medications have been entered into the medical record, reviewed, and corrections made.   Review of Systems: No fevers, chills, night sweats, weight loss, chest pain, or shortness of breath.   Objective:    General: Well Developed, well nourished, and in no acute distress.  Neuro: Alert and oriented x3, extra-ocular muscles intact, sensation grossly intact.  HEENT: Normocephalic, atraumatic  Skin: Warm and dry, no rashes. Cardiac: Regular rate and rhythm, no murmurs rubs or gallops, no lower extremity edema.  Respiratory: Clear to auscultation bilaterally. Not using accessory muscles, speaking in full sentences.   Impression and Recommendations:    IFG -A1c is up to 6.3.  We discussed options including going ahead and starting low-dose metformin there are some good studies looking at starting this and prediabetes to reduce the risk of progression to full-blown diabetes.  We also discussed importance of continue to work on healthy diet and weight loss.  In fact since last MSR she is actually lost 7 pounds and I congratulated her on that weight loss.  Obesity/BMI 34-congratulated her on 7 pound weight loss and encouraged her to keep up the good work.   CAD -Stable.  No recent pain.  Continue with statin.  Chronic  pain from bilateral osteoarthritis of the knees-I be happy to take over the duloxetine prescription.  Refill sent to pharmacy today.

## 2018-09-06 ENCOUNTER — Other Ambulatory Visit: Payer: Self-pay | Admitting: Cardiology

## 2018-09-25 ENCOUNTER — Other Ambulatory Visit: Payer: Self-pay | Admitting: Family Medicine

## 2018-09-30 DIAGNOSIS — R69 Illness, unspecified: Secondary | ICD-10-CM | POA: Diagnosis not present

## 2018-11-01 DIAGNOSIS — E7800 Pure hypercholesterolemia, unspecified: Secondary | ICD-10-CM | POA: Diagnosis not present

## 2018-11-01 DIAGNOSIS — R7301 Impaired fasting glucose: Secondary | ICD-10-CM | POA: Diagnosis not present

## 2018-11-02 LAB — COMPLETE METABOLIC PANEL WITH GFR
AG RATIO: 1.9 (calc) (ref 1.0–2.5)
ALT: 15 U/L (ref 6–29)
AST: 15 U/L (ref 10–35)
Albumin: 4.2 g/dL (ref 3.6–5.1)
Alkaline phosphatase (APISO): 96 U/L (ref 37–153)
BUN: 19 mg/dL (ref 7–25)
CO2: 28 mmol/L (ref 20–32)
CREATININE: 0.79 mg/dL (ref 0.50–0.99)
Calcium: 9.7 mg/dL (ref 8.6–10.4)
Chloride: 103 mmol/L (ref 98–110)
GFR, EST AFRICAN AMERICAN: 90 mL/min/{1.73_m2} (ref >=60)
GFR, EST NON AFRICAN AMERICAN: 77 mL/min/{1.73_m2} (ref >=60)
GLOBULIN: 2.2 g/dL (ref 1.9–3.7)
Glucose, Bld: 87 mg/dL (ref 65–99)
POTASSIUM: 5.6 mmol/L — AB (ref 3.5–5.3)
SODIUM: 138 mmol/L (ref 135–146)
TOTAL PROTEIN: 6.4 g/dL (ref 6.1–8.1)
Total Bilirubin: 0.9 mg/dL (ref 0.2–1.2)

## 2018-11-02 LAB — LIPID PANEL
CHOL/HDL RATIO: 2.5 (calc) (ref <5.0)
Cholesterol: 174 mg/dL (ref <200)
HDL: 69 mg/dL (ref >=50)
LDL Cholesterol (Calc): 84 mg/dL (calc)
NON-HDL CHOLESTEROL (CALC): 105 mg/dL (ref <130)
TRIGLYCERIDES: 115 mg/dL (ref <150)

## 2018-11-07 ENCOUNTER — Other Ambulatory Visit: Payer: Self-pay

## 2018-11-07 DIAGNOSIS — E875 Hyperkalemia: Secondary | ICD-10-CM

## 2018-11-07 NOTE — Progress Notes (Signed)
Ordered labs for lab result note.

## 2018-11-10 DIAGNOSIS — E875 Hyperkalemia: Secondary | ICD-10-CM | POA: Diagnosis not present

## 2018-11-11 LAB — COMPLETE METABOLIC PANEL WITH GFR
AG Ratio: 2 (calc) (ref 1.0–2.5)
ALKALINE PHOSPHATASE (APISO): 109 U/L (ref 37–153)
ALT: 19 U/L (ref 6–29)
AST: 15 U/L (ref 10–35)
Albumin: 3.9 g/dL (ref 3.6–5.1)
BUN: 19 mg/dL (ref 7–25)
CO2: 28 mmol/L (ref 20–32)
CREATININE: 0.89 mg/dL (ref 0.50–0.99)
Calcium: 8.9 mg/dL (ref 8.6–10.4)
Chloride: 105 mmol/L (ref 98–110)
GFR, EST NON AFRICAN AMERICAN: 67 mL/min/{1.73_m2} (ref >=60)
GFR, Est African American: 78 mL/min/{1.73_m2} (ref >=60)
GLOBULIN: 2 g/dL (ref 1.9–3.7)
GLUCOSE: 119 mg/dL — AB (ref 65–99)
Potassium: 4.5 mmol/L (ref 3.5–5.3)
SODIUM: 140 mmol/L (ref 135–146)
Total Bilirubin: 0.6 mg/dL (ref 0.2–1.2)
Total Protein: 5.9 g/dL — ABNORMAL LOW (ref 6.1–8.1)

## 2018-11-14 ENCOUNTER — Encounter: Payer: Self-pay | Admitting: *Deleted

## 2018-11-18 ENCOUNTER — Other Ambulatory Visit: Payer: Self-pay | Admitting: Family Medicine

## 2018-12-01 ENCOUNTER — Other Ambulatory Visit: Payer: Self-pay | Admitting: Cardiology

## 2018-12-01 NOTE — Progress Notes (Signed)
Virtual Visit via Video Note changed to phone visit as patient did not have a smart phone.   This visit type was conducted due to national recommendations for restrictions regarding the COVID-19 Pandemic (e.g. social distancing) in an effort to limit this patient's exposure and mitigate transmission in our community.  Due to her co-morbid illnesses, this patient is at least at moderate risk for complications without adequate follow up.  This format is felt to be most appropriate for this patient at this time.  All issues noted in this document were discussed and addressed.  A limited physical exam was performed with this format.  Please refer to the patient's chart for her consent to telehealth for [facility].   Evaluation Performed:  Follow-up visit  Date:  12/07/2018   ID:  Rachel Carr, DOB 08-Aug-1951, MRN [id number]  Patient Location: Home Provider Location: Home  PCP:  Hali Marry, MD  Cardiologist:  Dr Stanford Breed  Chief Complaint:  FU CAD  History of Present Illness:    FU CAD. Previously followed by [facility]. Cath 7/17 showed 99 RCA and no other obstructive CAD; had PCI of RCA with DES. Last echo 7/17 showed normal LV function.  Since last seen, the patient denies any dyspnea on exertion, orthopnea, PND, palpitations, syncope or chest pain. Occasional mild pedal edema.   The patient does not have symptoms concerning for COVID-19 infection (fever, chills, cough, or new shortness of breath).    Past Medical History:  Diagnosis Date  . Barrett's syndrome   . CAD (coronary artery disease)   . Esophageal stricture   . Esophageal ulcer 09-2010   gastritis Dr. Glennon Hamilton  . Gastric polyps    colonoscopy 2012(due again for repeat 2017)  . Shingles    on the right eye   Past Surgical History:  Procedure Laterality Date  . ABDOMINAL HYSTERECTOMY     endometriosis  . CARPAL TUNNEL RELEASE    . CHOLECYSTECTOMY    . NISSEN FUNDOPLICATION  9242  . RADIOFREQUENCY  ABLATION    . RT knee surgery       Current Meds  Medication Sig  . albuterol (PROVENTIL HFA;VENTOLIN HFA) 108 (90 Base) MCG/ACT inhaler Inhale 2 puffs into the lungs every 4 (four) hours as needed for wheezing (cough, shortness of breath or wheezing.).  Marland Kitchen atorvastatin (LIPITOR) 80 MG tablet TAKE ONE TABLET (80 MG TOTAL) BY MOUTH AT BEDTIME.  . carvedilol (COREG) 6.25 MG tablet TAKE 1 TABLET (6.25 MG TOTAL) BY MOUTH 2 (TWO) TIMES DAILY WITH A MEAL. NEED OV.  . [employer] CH[facility]ENS ASPIRIN 81 MG chewable tablet Chew 1 tablet by mouth.  . DULoxetine (CYMBALTA) 30 MG capsule TAKE 2 CAPSULES BY MOUTH EVERY DAY  . GARLIC PO Take 6,834 mg by mouth daily.  . metFORMIN (GLUCOPHAGE-XR) 500 MG 24 hr tablet Take 1 tablet (500 mg total) by mouth daily with breakfast.     Allergies:   Tramadol   Social History   Tobacco Use  . Smoking status: Former Smoker    Packs/day: 0.50    Years: 20.00    Pack years: 10.00  . Smokeless tobacco: Never Used  . Tobacco comment: Quit in 2017  Substance Use Topics  . Alcohol use: No  . Drug use: No     Family Hx: The patient's family history includes Cancer in her brother, father, and sister; Diabetes in her father and sister; Heart disease in her mother.  ROS:   Please see the history of  present illness.    No fevers, chills or productive cough. All other systems reviewed and are negative.   Recent Labs: 11/10/2018: ALT 19; BUN 19; Creat 0.89; Potassium 4.5; Sodium 140   Recent Lipid Panel Lab Results  Component Value Date/Time   CHOL 174 11/01/2018 10:30 AM   TRIG 115 11/01/2018 10:30 AM   HDL 69 11/01/2018 10:30 AM   CHOLHDL 2.5 11/01/2018 10:30 AM   LDLCALC 84 11/01/2018 10:30 AM    Wt Readings from Last 3 Encounters:  12/07/18 176 lb (79.8 kg)  09/02/18 177 lb (80.3 kg)  07/02/18 179 lb (81.2 kg)     Objective:    Vital Signs:  Ht 5\' 1"  (1.549 m)   Wt 176 lb (79.8 kg)   BMI 33.25 kg/m    VITAL SIGNS:  reviewed  no acute distress  Normal affect Answers questions appropriately Remainder of physical examination not performed (telehealth visit; coronavirus pandemic)   ASSESSMENT & PLAN:    1. Coronary artery disease-patient denies recurrent chest pain.  Continue medical therapy with aspirin and statin. 2. Hyperlipidemia-continue statin. 3. Hypertension-patient's blood pressure is controlled.  Continue present medications and follow. 4. Pedal edema-patient describes minimal pedal edema at the end of the day.  I have asked her to keep her feet elevated at night and begin low-sodium diet.  We can consider low-dose diuretic in the future if needed.  COVID-19 Education: The importance of social distancing was discussed today.  Time:   Today, I have spent 15 minutes with the patient with telehealth technology discussing the above problems.     Medication Adjustments/Labs and Tests Ordered: Current medicines are reviewed at length with the patient today.  Concerns regarding medicines are outlined above.   Tests Ordered: No orders of the defined types were placed in this encounter.   Medication Changes: No orders of the defined types were placed in this encounter.   Disposition:  Follow up in 6 month(s)  Signed, Kirk Ruths, MD  12/07/2018 3:35 PM    [facility]

## 2018-12-01 NOTE — Telephone Encounter (Signed)
Carvedilol 6.25 mg refilled. 

## 2018-12-07 ENCOUNTER — Other Ambulatory Visit: Payer: Self-pay

## 2018-12-07 ENCOUNTER — Telehealth (INDEPENDENT_AMBULATORY_CARE_PROVIDER_SITE_OTHER): Payer: Medicare HMO | Admitting: Cardiology

## 2018-12-07 ENCOUNTER — Telehealth: Payer: Self-pay

## 2018-12-07 ENCOUNTER — Encounter: Payer: Self-pay | Admitting: Cardiology

## 2018-12-07 VITALS — Ht 61.0 in | Wt 176.0 lb

## 2018-12-07 DIAGNOSIS — D692 Other nonthrombocytopenic purpura: Secondary | ICD-10-CM | POA: Diagnosis not present

## 2018-12-07 DIAGNOSIS — L579 Skin changes due to chronic exposure to nonionizing radiation, unspecified: Secondary | ICD-10-CM | POA: Diagnosis not present

## 2018-12-07 DIAGNOSIS — D485 Neoplasm of uncertain behavior of skin: Secondary | ICD-10-CM | POA: Diagnosis not present

## 2018-12-07 DIAGNOSIS — L814 Other melanin hyperpigmentation: Secondary | ICD-10-CM | POA: Diagnosis not present

## 2018-12-07 DIAGNOSIS — I10 Essential (primary) hypertension: Secondary | ICD-10-CM

## 2018-12-07 DIAGNOSIS — L570 Actinic keratosis: Secondary | ICD-10-CM | POA: Diagnosis not present

## 2018-12-07 DIAGNOSIS — L821 Other seborrheic keratosis: Secondary | ICD-10-CM | POA: Diagnosis not present

## 2018-12-07 DIAGNOSIS — I2510 Atherosclerotic heart disease of native coronary artery without angina pectoris: Secondary | ICD-10-CM | POA: Diagnosis not present

## 2018-12-07 DIAGNOSIS — D0439 Carcinoma in situ of skin of other parts of face: Secondary | ICD-10-CM | POA: Diagnosis not present

## 2018-12-07 DIAGNOSIS — E7800 Pure hypercholesterolemia, unspecified: Secondary | ICD-10-CM

## 2018-12-07 DIAGNOSIS — D1801 Hemangioma of skin and subcutaneous tissue: Secondary | ICD-10-CM | POA: Diagnosis not present

## 2018-12-07 NOTE — Telephone Encounter (Signed)

## 2018-12-07 NOTE — Patient Instructions (Signed)
Medication Instructions:  NO CHANGE If you need a refill on your cardiac medications before your next appointment, please call your pharmacy.   Lab work: If you have labs (blood work) drawn today and your tests are completely normal, you will receive your results only by: Marland Kitchen MyChart Message (if you have MyChart) OR . A paper copy in the mail If you have any lab test that is abnormal or we need to change your treatment, we will call you to review the results.  Follow-Up: At [facility], you and your health needs are our priority.  As part of our continuing mission to provide you with exceptional heart care, we have created designated Provider Care Teams.  These Care Teams include your primary Cardiologist (physician) and Advanced Practice Providers (APPs -  Physician Assistants and Nurse Practitioners) who all work together to provide you with the care you need, when you need it. You will need a follow up appointment in 6 months.  Please call our office 2 months in advance to schedule this appointment.  You may see Kirk Ruths MD or one of the following Advanced Practice Providers on your designated Care Team:   Kerin Ransom, [place] . Sande Rives, PA-C

## 2018-12-25 ENCOUNTER — Other Ambulatory Visit: Payer: Self-pay | Admitting: Cardiology

## 2018-12-26 NOTE — Telephone Encounter (Signed)
Carvedilol 6.25 mg refilled. 

## 2019-01-23 ENCOUNTER — Encounter: Payer: Self-pay | Admitting: Sports Medicine

## 2019-01-23 ENCOUNTER — Ambulatory Visit (INDEPENDENT_AMBULATORY_CARE_PROVIDER_SITE_OTHER): Payer: Medicare HMO | Admitting: Sports Medicine

## 2019-01-23 DIAGNOSIS — M170 Bilateral primary osteoarthritis of knee: Secondary | ICD-10-CM | POA: Diagnosis not present

## 2019-01-23 NOTE — Progress Notes (Signed)
Subjective:    CC: Bilateral knee pain  HPI: Rachel Carr is a very pleasant 68 year old female with end-stage osteoarthritis of both knees, we have done injections, several months ago.  I did let her know that I thought she would need to proceed with a knee arthroplasty.  She has not had surgery, she is back for further evaluation, pain has recurred, severe, persistent, localized at the joint lines and anteriorly without radiation, trauma, no mechanical symptoms.  I reviewed the past medical history, family history, social history, surgical history, and allergies today and no changes were needed.  Please see the problem list section below in epic for further details.  Past Medical History: Past Medical History:  Diagnosis Date  . Barrett's syndrome   . CAD (coronary artery disease)   . Esophageal stricture   . Esophageal ulcer 09-2010   gastritis Dr. Glennon Hamilton  . Gastric polyps    colonoscopy 2012(due again for repeat 2017)  . Shingles    on the right eye   Past Surgical History: Past Surgical History:  Procedure Laterality Date  . ABDOMINAL HYSTERECTOMY     endometriosis  . CARPAL TUNNEL RELEASE    . CHOLECYSTECTOMY    . NISSEN FUNDOPLICATION  7353  . RADIOFREQUENCY ABLATION    . RT knee surgery     Social History: Social History   Socioeconomic History  . Marital status: Married    Spouse name: Not on file  . Number of children: 1  . Years of education: Not on file  . Highest education level: Not on file  Occupational History  . Not on file  Social Needs  . Financial resource strain: Not on file  . Food insecurity    Worry: Not on file    Inability: Not on file  . Transportation needs    Medical: Not on file    Non-medical: Not on file  Tobacco Use  . Smoking status: Former Smoker    Packs/day: 0.50    Years: 20.00    Pack years: 10.00  . Smokeless tobacco: Never Used  . Tobacco comment: Quit in 2017  Substance and Sexual Activity  . Alcohol use: No  . Drug  use: No  . 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  Lifestyle  . Physical activity    Days per week: Not on file    Minutes per session: Not on file  . Stress: Not on file  Relationships  . Social Herbalist on phone: Not on file    Gets together: Not on file    Attends religious service: Not on file    Active member of club or organization: Not on file    Attends meetings of clubs or organizations: Not on file    Relationship status: Not on file  Other Topics Concern  . Not on file  Social History Narrative  . Not on file   Family History: Family History  Problem Relation Age of Onset  . Heart disease Mother   . Diabetes Father   . Cancer Father        prostrate  . Cancer Sister        esophagus  . Diabetes Sister   . Cancer Brother        kidney   Allergies: Allergies  Allergen Reactions  . Tramadol Nausea Only   Medications: See med rec.  Review of Systems: No fevers, chills, night sweats, weight loss, chest pain,  or shortness of breath.   Objective:    General: Well Developed, well nourished, and in no acute distress.  Neuro: Alert and oriented x3, extra-ocular muscles intact, sensation grossly intact.  HEENT: Normocephalic, atraumatic, pupils equal round reactive to light, neck supple, no masses, no lymphadenopathy, thyroid nonpalpable.  Skin: Warm and dry, no rashes. Cardiac: Regular rate and rhythm, no murmurs rubs or gallops, no lower extremity edema.  Respiratory: Clear to auscultation bilaterally. Not using accessory muscles, speaking in full sentences. Bilateral knees: Minimally swollen, tender to palpation at the patellar facets of the joint lines. ROM normal in flexion and extension and lower leg rotation. Ligaments with solid consistent endpoints including ACL, PCL, LCL, MCL. Negative Mcmurray's and provocative meniscal tests. Non painful patellar compression. Patellar and quadriceps tendons  unremarkable. Hamstring and quadriceps strength is normal.  Procedure: Real-time Ultrasound Guided injection of the left knee Device: GE Logiq E  Verbal informed consent obtained.  Time-out conducted.  Noted no overlying erythema, induration, or other signs of local infection.  Skin prepped in a sterile fashion.  Local anesthesia: Topical Ethyl chloride.  With sterile technique and under real time ultrasound guidance:  1 cc Kenalog 40, 2 cc lidocaine, 2 cc bupivacaine injected easily. Completed without difficulty  Pain immediately resolved suggesting accurate placement of the medication.  Advised to call if fevers/chills, erythema, induration, drainage, or persistent bleeding.  Images permanently stored and available for review in the ultrasound unit.  Impression: Technically successful ultrasound guided injection.  Procedure: Real-time Ultrasound Guided injection of the right knee Device: GE Logiq E  Verbal informed consent obtained.  Time-out conducted.  Noted no overlying erythema, induration, or other signs of local infection.  Skin prepped in a sterile fashion.  Local anesthesia: Topical Ethyl chloride.  With sterile technique and under real time ultrasound guidance:  1 cc Kenalog 40, 2 cc lidocaine, 2 cc bupivacaine injected easily. Completed without difficulty  Pain immediately resolved suggesting accurate placement of the medication.  Advised to call if fevers/chills, erythema, induration, drainage, or persistent bleeding.  Images permanently stored and available for review in the ultrasound unit.  Impression: Technically successful ultrasound guided injection.  Impression and Recommendations:    Bilateral primary osteoarthritis of knee Bilateral knee injection today. Return as needed, she does need an arthroplasty, arthritis is end-stage. I am happy to call in a narcotic should she desire. She also remains a candidate for Coolief radiofrequency ablation.    ___________________________________________ Gwen Her. Dianah Field, M.D., ABFM., CAQSM. Primary Care and Sports Medicine [facility]  Adjunct Professor of [employer] [facility]

## 2019-01-23 NOTE — Assessment & Plan Note (Signed)
Bilateral knee injection today. Return as needed, she does need an arthroplasty, arthritis is end-stage. I am happy to call in a narcotic should she desire. She also remains a candidate for Coolief radiofrequency ablation.

## 2019-02-14 ENCOUNTER — Other Ambulatory Visit: Payer: Self-pay | Admitting: Family Medicine

## 2019-02-25 ENCOUNTER — Other Ambulatory Visit: Payer: Self-pay | Admitting: Family Medicine

## 2019-03-13 ENCOUNTER — Telehealth: Payer: Self-pay | Admitting: Family Medicine

## 2019-03-13 DIAGNOSIS — M170 Bilateral primary osteoarthritis of knee: Secondary | ICD-10-CM

## 2019-03-13 MED ORDER — HYDROCODONE-ACETAMINOPHEN 5-325 MG PO TABS: 1.0000 | ORAL_TABLET | Freq: Three times a day (TID) | ORAL | 0 refills | Status: DC | PRN

## 2019-03-13 NOTE — Addendum Note (Signed)
Addended by: Silverio Decamp on: 03/13/2019 12:37 PM   Modules accepted: Orders

## 2019-03-13 NOTE — Telephone Encounter (Signed)
Referral placed to Dr. Berenice Primas.

## 2019-03-13 NOTE — Telephone Encounter (Signed)
Patient advised. She agreed to a referral to Ortho.

## 2019-03-13 NOTE — Telephone Encounter (Signed)
Sending in some hydrocodone. Because of end-stage knee arthritis viscosupplementation is likely not going to be helpful.  There is also the typical lateral subluxation of the tibia and end-stage arthritis.  Ultimately I think she needs a knee arthroplasty.

## 2019-03-13 NOTE — Telephone Encounter (Signed)
Pt called. Knee still hurts.  She wants to go ahead and begin the pain meds(something to take the edge off)  Thanks

## 2019-03-13 NOTE — Telephone Encounter (Signed)
Thanks

## 2019-03-18 ENCOUNTER — Other Ambulatory Visit: Payer: Self-pay | Admitting: Family Medicine

## 2019-03-23 ENCOUNTER — Encounter: Payer: Self-pay | Admitting: Family Medicine

## 2019-03-23 ENCOUNTER — Other Ambulatory Visit: Payer: Self-pay | Admitting: Family Medicine

## 2019-03-23 ENCOUNTER — Telehealth (INDEPENDENT_AMBULATORY_CARE_PROVIDER_SITE_OTHER): Payer: Medicare HMO | Admitting: Family Medicine

## 2019-03-23 VITALS — Ht 61.0 in | Wt 177.0 lb

## 2019-03-23 DIAGNOSIS — M25561 Pain in right knee: Secondary | ICD-10-CM

## 2019-03-23 DIAGNOSIS — I2510 Atherosclerotic heart disease of native coronary artery without angina pectoris: Secondary | ICD-10-CM | POA: Diagnosis not present

## 2019-03-23 DIAGNOSIS — Z9861 Coronary angioplasty status: Secondary | ICD-10-CM | POA: Diagnosis not present

## 2019-03-23 DIAGNOSIS — M170 Bilateral primary osteoarthritis of knee: Secondary | ICD-10-CM

## 2019-03-23 DIAGNOSIS — R7301 Impaired fasting glucose: Secondary | ICD-10-CM | POA: Diagnosis not present

## 2019-03-23 DIAGNOSIS — G8929 Other chronic pain: Secondary | ICD-10-CM | POA: Diagnosis not present

## 2019-03-23 DIAGNOSIS — M25562 Pain in left knee: Secondary | ICD-10-CM

## 2019-03-23 MED ORDER — METFORMIN HCL ER 500 MG PO TB24: 500.0000 mg | ORAL_TABLET | Freq: Every day | ORAL | 1 refills | Status: DC

## 2019-03-23 NOTE — Progress Notes (Signed)
Virtual Visit via Video Note  I connected with Rachel Carr on 03/23/19 at  4:00 PM EDT by a video enabled telemedicine application and verified that I am speaking with the correct person using two identifiers.   I discussed the limitations of evaluation and management by telemedicine and the availability of in person appointments. The patient expressed understanding and agreed to proceed.    Established Patient Office Visit  Subjective:  Patient ID: Rachel Carr, female    DOB: [birth year]  Age: 68 y.o. MRN: [id number]  CC:  Chief Complaint  Patient presents with  . ifg    HPI Rachel Carr presents for   Impaired fasting glucose-no increased thirst or urination. No symptoms consistent with hypoglycemia.  F/U chronic knee pain - she is on the cymbalta. Saw Dr. Darene Carr and he recommended surgical intervention.  She was having a pretty severe pain flare with her knees.  She says she is not sure what triggered or caused it but it actually seems to be better she feels like she is back to her baseline and so might hold off on the surgical consultation for arthroscopy.  F/U CAD- no reent CP ro SOB.  She is on her carvedilol.  Has f/U with Dr. Stanford Carr in September.     Past Medical History:  Diagnosis Date  . Barrett's syndrome   . CAD (coronary artery disease)   . Esophageal stricture   . Esophageal ulcer 09-2010   gastritis Dr. Glennon Carr  . Gastric polyps    colonoscopy 2012(due again for repeat 2017)  . Shingles    on the right eye    Past Surgical History:  Procedure Laterality Date  . ABDOMINAL HYSTERECTOMY     endometriosis  . CARPAL TUNNEL RELEASE    . CHOLECYSTECTOMY    . NISSEN FUNDOPLICATION  2297  . RADIOFREQUENCY ABLATION    . RT knee surgery      Family History  Problem Relation Age of Onset  . Heart disease Mother   . Diabetes Father   . Cancer Father        prostrate  . Cancer Sister        esophagus  . Diabetes Sister   . Cancer Brother         kidney    Social History   Socioeconomic History  . Marital status: Married    Spouse name: Not on file  . Number of children: 1  . Years of education: Not on file  . Highest education level: Not on file  Occupational History  . Not on file  Social Needs  . Financial resource strain: Not on file  . Food insecurity    Worry: Not on file    Inability: Not on file  . Transportation needs    Medical: Not on file    Non-medical: Not on file  Tobacco Use  . Smoking status: Former Smoker    Packs/day: 0.50    Years: 20.00    Pack years: 10.00  . Smokeless tobacco: Never Used  . Tobacco comment: Quit in 2017  Substance and Sexual Activity  . Alcohol use: No  . Drug use: No  . 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  Lifestyle  . Physical activity    Days per week: Not on file    Minutes per session: Not on file  . Stress: Not on file  Relationships  . Social connections    Talks  on phone: Not on file    Gets together: Not on file    Attends religious service: Not on file    Active member of club or organization: Not on file    Attends meetings of clubs or organizations: Not on file    Relationship status: Not on file  . Intimate partner violence    Fear of current or ex partner: Not on file    Emotionally abused: Not on file    Physically abused: Not on file    Forced sexual activity: Not on file  Other Topics Concern  . Not on file  Social History Narrative  . Not on file    Outpatient Medications Prior to Visit  Medication Sig Dispense Refill  . albuterol (VENTOLIN HFA) 108 (90 Base) MCG/ACT inhaler INHALE 2 PUFFS INTO THE LUNGS EVERY 4 (FOUR) HOURS AS NEEDED FOR WHEEZING 18 g prn  . atorvastatin (LIPITOR) 80 MG tablet TAKE ONE TABLET (80 MG TOTAL) BY MOUTH AT BEDTIME. 90 tablet 3  . carvedilol (COREG) 6.25 MG tablet Take 1 tablet (6.25 mg total) by mouth 2 (two) times daily with a meal. 60 tablet 11  . [employer] CH[facility]DRENS  ASPIRIN 81 MG chewable tablet Chew 1 tablet by mouth.  11  . DULoxetine (CYMBALTA) 30 MG capsule TAKE 2 CAPSULES BY MOUTH EVERY DAY 180 capsule 5  . GARLIC PO Take 5,809 mg by mouth daily.    Marland Kitchen HYDROcodone-acetaminophen (NORCO/VICODIN) 5-325 MG tablet Take 1 tablet by mouth every 8 (eight) hours as needed for moderate pain. 15 tablet 0  . metFORMIN (GLUCOPHAGE-XR) 500 MG 24 hr tablet Take 1 tablet (500 mg total) by mouth daily with breakfast. Due for follow up visit w/PCP 30 tablet 0   No facility-administered medications prior to visit.     Allergies  Allergen Reactions  . Tramadol Nausea Only    ROS Review of Systems    Objective:    Physical Exam  Ht 5\' 1"  (1.549 m)   Wt 177 lb (80.3 kg)   BMI 33.44 kg/m  Wt Readings from Last 3 Encounters:  03/23/19 177 lb (80.3 kg)  01/23/19 182 lb (82.6 kg)  12/07/18 176 lb (79.8 kg)     There are no preventive care reminders to display for this patient.  There are no preventive care reminders to display for this patient.  Lab Results  Component Value Date   TSH 0.79 10/25/2017   Lab Results  Component Value Date   WBC 6.2 02/25/2016   HGB 12.6 02/25/2016   HCT 37 02/25/2016   MCV 93.1 11/19/2015   PLT 247 11/19/2015   Lab Results  Component Value Date   NA 140 11/10/2018   K 4.5 11/10/2018   CO2 28 11/10/2018   GLUCOSE 119 (H) 11/10/2018   BUN 19 11/10/2018   CREATININE 0.89 11/10/2018   BILITOT 0.6 11/10/2018   ALKPHOS 94 09/29/2016   AST 15 11/10/2018   ALT 19 11/10/2018   PROT 5.9 (L) 11/10/2018   ALBUMIN 4.0 09/29/2016   CALCIUM 8.9 11/10/2018   Lab Results  Component Value Date   CHOL 174 11/01/2018   Lab Results  Component Value Date   HDL 69 11/01/2018   Lab Results  Component Value Date   LDLCALC 84 11/01/2018   Lab Results  Component Value Date   TRIG 115 11/01/2018   Lab Results  Component Value Date   CHOLHDL 2.5 11/01/2018   Lab Results  Component Value Date  HGBA1C 6.3 (A)  09/02/2018      Assessment & Plan:   Problem List Items Addressed This Visit      Cardiovascular and Mediastinum   CAD S/P percutaneous coronary angioplasty    Refilled for year. Keep f/u with Dr. Stanford Carr next mo.         Endocrine   IFG (impaired fasting glucose) - Primary    Due for a1c. She will go to the lab this month. F/U in 6 months.       Relevant Medications   metFORMIN (GLUCOPHAGE-XR) 500 MG 24 hr tablet   Other Relevant Orders   HgB B7C   BASIC METABOLIC PANEL WITH GFR     Other   Bilateral primary osteoarthritis of knee    Continue with Cymbalta since it does seem to help.  She had a recent flare with pain but it actually seems to have gone back down to baseline so she thinks she might hold off on consultation with a surgeon.       Other Visit Diagnoses    Chronic pain of both knees          Meds ordered this encounter  Medications  . metFORMIN (GLUCOPHAGE-XR) 500 MG 24 hr tablet    Sig: Take 1 tablet (500 mg total) by mouth daily with breakfast.    Dispense:  90 tablet    Refill:  1    Follow-up: Return in about 6 months (around 09/23/2019) for prediabetes. .     I discussed the assessment and treatment plan with the patient. The patient was provided an opportunity to ask questions and all were answered. The patient agreed with the plan and demonstrated an understanding of the instructions.   The patient was advised to call back or seek an in-person evaluation if the symptoms worsen or if the condition fails to improve as anticipated.   Beatrice Lecher, MD

## 2019-03-23 NOTE — Assessment & Plan Note (Signed)
Refilled for year. Keep f/u with Dr. Stanford Breed next mo.

## 2019-03-23 NOTE — Assessment & Plan Note (Signed)
Due for a1c. She will go to the lab this month. F/U in 6 months.

## 2019-03-23 NOTE — Assessment & Plan Note (Signed)
Continue with Cymbalta since it does seem to help.  She had a recent flare with pain but it actually seems to have gone back down to baseline so she thinks she might hold off on consultation with a surgeon.

## 2019-03-28 DIAGNOSIS — R7301 Impaired fasting glucose: Secondary | ICD-10-CM | POA: Diagnosis not present

## 2019-03-29 LAB — BASIC METABOLIC PANEL WITH GFR
BUN: 12 mg/dL (ref 7–25)
CO2: 28 mmol/L (ref 20–32)
Calcium: 9.3 mg/dL (ref 8.6–10.4)
Chloride: 102 mmol/L (ref 98–110)
Creat: 0.82 mg/dL (ref 0.50–0.99)
GFR, Est African American: 85 mL/min/{1.73_m2} (ref >=60)
GFR, Est Non African American: 74 mL/min/{1.73_m2} (ref >=60)
Glucose, Bld: 111 mg/dL — ABNORMAL HIGH (ref 65–99)
Potassium: 4.7 mmol/L (ref 3.5–5.3)
Sodium: 137 mmol/L (ref 135–146)

## 2019-03-29 LAB — HEMOGLOBIN A1C
Hgb A1c MFr Bld: 6.1 % of total Hgb — ABNORMAL HIGH (ref <5.7)
Mean Plasma Glucose: 128 (calc)
eAG (mmol/L): 7.1 (calc)

## 2019-04-19 DIAGNOSIS — Z1159 Encounter for screening for other viral diseases: Secondary | ICD-10-CM | POA: Diagnosis not present

## 2019-04-19 DIAGNOSIS — R5383 Other fatigue: Secondary | ICD-10-CM | POA: Diagnosis not present

## 2019-04-19 DIAGNOSIS — R739 Hyperglycemia, unspecified: Secondary | ICD-10-CM | POA: Diagnosis not present

## 2019-04-19 DIAGNOSIS — Z955 Presence of coronary angioplasty implant and graft: Secondary | ICD-10-CM | POA: Diagnosis not present

## 2019-04-19 DIAGNOSIS — E538 Deficiency of other specified B group vitamins: Secondary | ICD-10-CM | POA: Diagnosis not present

## 2019-04-19 DIAGNOSIS — E559 Vitamin D deficiency, unspecified: Secondary | ICD-10-CM | POA: Diagnosis not present

## 2019-04-19 DIAGNOSIS — R0602 Shortness of breath: Secondary | ICD-10-CM | POA: Diagnosis not present

## 2019-04-26 ENCOUNTER — Telehealth: Payer: Self-pay

## 2019-04-26 NOTE — Telephone Encounter (Signed)
Patient called stating she is unsure about wanting to do knee replacement.. states she called her insurance and they will cover stem cell injections.. she wants to know where she should go for this and wondering if this would be beneficial?

## 2019-04-26 NOTE — Telephone Encounter (Signed)
Left pt to call back with this info or send via MyChart or fax it

## 2019-04-26 NOTE — Telephone Encounter (Signed)
They likely do not cover STEM CELL injections, I think she probably needs to clarify exactly what they are referring to.  They may be referring to artificial joint fluid or PRP, not stem cells.  Have her clarify exactly what the name of the procedure is that they cover and/or what the CPT code is.

## 2019-05-03 DIAGNOSIS — R69 Illness, unspecified: Secondary | ICD-10-CM | POA: Diagnosis not present

## 2019-05-12 ENCOUNTER — Other Ambulatory Visit: Payer: Self-pay | Admitting: Family Medicine

## 2019-06-06 NOTE — Progress Notes (Signed)
HPI: FU CAD. Previously followed by [facility]. Cath 7/17 showed 99 RCA and no other obstructive CAD; had PCI of RCA with DES. Last echo 7/17 showed normal LV function.Since last seen, the patient has dyspnea with more extreme activities but not with routine activities. It is relieved with rest. It is not associated with chest pain. There is no orthopnea, PND or pedal edema. There is no syncope or palpitations. There is no exertional chest pain.   Current Outpatient Medications  Medication Sig Dispense Refill  . albuterol (VENTOLIN HFA) 108 (90 Base) MCG/ACT inhaler INHALE 2 PUFFS INTO THE LUNGS EVERY 4 (FOUR) HOURS AS NEEDED FOR WHEEZING 18 g prn  . atorvastatin (LIPITOR) 80 MG tablet TAKE ONE TABLET (80 MG TOTAL) BY MOUTH AT BEDTIME. 90 tablet 3  . carvedilol (COREG) 6.25 MG tablet Take 1 tablet (6.25 mg total) by mouth 2 (two) times daily with a meal. 60 tablet 11  . [facility] CH[facility]ENS ASPIRIN 81 MG chewable tablet Chew 1 tablet by mouth.  11  . DULoxetine (CYMBALTA) 30 MG capsule TAKE 2 CAPSULES BY MOUTH EVERY DAY 180 capsule 3  . GARLIC PO Take XX123456 mg by mouth daily.    . metFORMIN (GLUCOPHAGE-XR) 500 MG 24 hr tablet Take 1 tablet (500 mg total) by mouth daily with breakfast. 90 tablet 1   No current facility-administered medications for this visit.      Past Medical History:  Diagnosis Date  . Barrett's syndrome   . CAD (coronary artery disease)   . Esophageal stricture   . Esophageal ulcer 09-2010   gastritis Dr. Glennon Hamilton  . Gastric polyps    colonoscopy 2012(due again for repeat 2017)  . Shingles    on the right eye    Past Surgical History:  Procedure Laterality Date  . ABDOMINAL HYSTERECTOMY     endometriosis  . CARPAL TUNNEL RELEASE    . CHOLECYSTECTOMY    . NISSEN FUNDOPLICATION  0000000  . RADIOFREQUENCY ABLATION    . RT knee surgery      Social History   Socioeconomic History  . Marital status: Married    Spouse name: Not on file  . Number of children: 1  .  Years of education: Not on file  . Highest education level: Not on file  Occupational History  . Not on file  Social Needs  . Financial resource strain: Not on file  . Food insecurity    Worry: Not on file    Inability: Not on file  . Transportation needs    Medical: Not on file    Non-medical: Not on file  Tobacco Use  . Smoking status: Former Smoker    Packs/day: 0.50    Years: 20.00    Pack years: 10.00  . Smokeless tobacco: Never Used  . Tobacco comment: Quit in 2017  Substance and Sexual Activity  . Alcohol use: No  . Drug use: No  . 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  Lifestyle  . Physical activity    Days per week: Not on file    Minutes per session: Not on file  . Stress: Not on file  Relationships  . Social Herbalist on phone: Not on file    Gets together: Not on file    Attends religious service: Not on file    Active member of club or organization: Not on file    Attends meetings of clubs  or organizations: Not on file    Relationship status: Not on file  . Intimate partner violence    Fear of current or ex partner: Not on file    Emotionally abused: Not on file    Physically abused: Not on file    Forced sexual activity: Not on file  Other Topics Concern  . Not on file  Social History Narrative  . Not on file    Family History  Problem Relation Age of Onset  . Heart disease Mother   . Diabetes Father   . Cancer Father        prostrate  . Cancer Sister        esophagus  . Diabetes Sister   . Cancer Brother        kidney    ROS: no fevers or chills, productive cough, hemoptysis, dysphasia, odynophagia, melena, hematochezia, dysuria, hematuria, rash, seizure activity, orthopnea, PND, pedal edema, claudication. Remaining systems are negative.  Physical Exam: Well-developed well-nourished in no acute distress.  Skin is warm and dry.  HEENT is normal.  Neck is supple.  Chest is clear to  auscultation with normal expansion.  Cardiovascular exam is regular rate and rhythm.  Abdominal exam nontender or distended. No masses palpated. Extremities show trace edema. neuro grossly intact  ECG-sinus rhythm at a rate of 74, no ST changes.  Personally reviewed  A/P  1 coronary artery disease-patient has not had recurrent chest pain.  Continue medical therapy with aspirin and statin.  2 hypertension-blood pressure is controlled.  Continue present medications and follow.  3 hyperlipidemia-most recent LDL not at goal (84).  Continue Lipitor 80 mg daily.  Add Zetia 10 mg daily.  Check lipids and liver in 12 weeks.  4 history of mild pedal edema-continue leg elevation and low-sodium diet.  Can add low-dose diuretic in the future if needed.  Kirk Ruths, MD

## 2019-06-07 ENCOUNTER — Encounter: Payer: Self-pay | Admitting: Cardiology

## 2019-06-07 ENCOUNTER — Ambulatory Visit (INDEPENDENT_AMBULATORY_CARE_PROVIDER_SITE_OTHER): Payer: Medicare HMO | Admitting: Cardiology

## 2019-06-07 ENCOUNTER — Other Ambulatory Visit: Payer: Self-pay

## 2019-06-07 VITALS — BP 137/79 | HR 74 | Ht 61.0 in | Wt 186.1 lb

## 2019-06-07 DIAGNOSIS — E7800 Pure hypercholesterolemia, unspecified: Secondary | ICD-10-CM | POA: Diagnosis not present

## 2019-06-07 DIAGNOSIS — I2510 Atherosclerotic heart disease of native coronary artery without angina pectoris: Secondary | ICD-10-CM | POA: Diagnosis not present

## 2019-06-07 DIAGNOSIS — I10 Essential (primary) hypertension: Secondary | ICD-10-CM | POA: Diagnosis not present

## 2019-06-07 MED ORDER — EZETIMIBE 10 MG PO TABS: 10.0000 mg | ORAL_TABLET | Freq: Every day | ORAL | 3 refills | Status: DC

## 2019-06-07 NOTE — Patient Instructions (Signed)
Medication Instructions:  Start ezetimibe 10 mg once daily *If you need a refill on your cardiac medications before your next appointment, please call your pharmacy*  Lab Work: Your physician recommends that you return for lab work in: 12 weeks prior to eating If you have labs (blood work) drawn today and your tests are completely normal, you will receive your results only by: Marland Kitchen MyChart Message (if you have MyChart) OR . A paper copy in the mail If you have any lab test that is abnormal or we need to change your treatment, we will call you to review the results.  Follow-Up: At [facility], you and your health needs are our priority.  As part of our continuing mission to provide you with exceptional heart care, we have created designated Provider Care Teams.  These Care Teams include your primary Cardiologist (physician) and Advanced Practice Providers (APPs -  Physician Assistants and Nurse Practitioners) who all work together to provide you with the care you need, when you need it.  Your next appointment:   12 months  The format for your next appointment:   Either In Person or Virtual  Provider:   Kirk Ruths, MD

## 2019-08-15 ENCOUNTER — Encounter: Payer: Self-pay | Admitting: Nurse Practitioner

## 2019-08-15 ENCOUNTER — Ambulatory Visit (INDEPENDENT_AMBULATORY_CARE_PROVIDER_SITE_OTHER): Payer: Medicare HMO | Admitting: Nurse Practitioner

## 2019-08-15 VITALS — Temp 99.0°F | Ht 61.0 in | Wt 177.0 lb

## 2019-08-15 DIAGNOSIS — B9689 Other specified bacterial agents as the cause of diseases classified elsewhere: Secondary | ICD-10-CM

## 2019-08-15 DIAGNOSIS — J019 Acute sinusitis, unspecified: Secondary | ICD-10-CM | POA: Diagnosis not present

## 2019-08-15 MED ORDER — AMOXICILLIN-POT CLAVULANATE 875-125 MG PO TABS: 1.0000 | ORAL_TABLET | Freq: Two times a day (BID) | ORAL | 0 refills | Status: DC

## 2019-08-15 NOTE — Progress Notes (Signed)
Virtual Visit via Telephone Note  I connected with Rachel Carr on 08/15/19 at  9:30 AM EST by telephone and verified that I am speaking with the correct person using two identifiers.   I discussed the limitations, risks, security and privacy concerns of performing an evaluation and management service by telephone and the availability of in person appointments. I also discussed with the patient that there may be a patient responsible charge related to this service. The patient expressed understanding and agreed to proceed.   Subjective:    CC: "Sinus pain and pressure"  HPI: Rachel Carr is a pleasant 69 year old female presenting today via telephone visit for sinus pain and pressure for about 5 days. Her current pain is 7/10 in the frontal and maxillary sinuses. She reports her mucous production has turned from clear to yellow/green and her symptoms are progressively worsening.  She reports this occurs at least once a year and she requires an antibiotic for treatment.  Worst symptom: Sinus pain and pressure Fever: no Cough: yes- with drainage Shortness of breath: no Wheezing: no Chest pain: no Chest tightness: no Chest congestion: yes Nasal congestion: yes Runny nose: yes Post nasal drip: yes Sneezing: no Sore throat: yes Swollen glands: unsure Sinus pressure: yes Headache: yes Face pain: yes Toothache: no Ear pain: yes bilateral Ear pressure: yes bilateral Eyes red/itching:no Eye drainage/crusting: no  Vomiting: no Rash: no Fatigue: yes Sick contacts: no Strep contacts: no  Context: worse Recurrent sinusitis: yes- typically annually  Relief with OTC cold/cough medications: no  Treatments attempted: netti-pot    Past medical history, Surgical history, Family history not pertinant except as noted below, Social history, Allergies, and medications have been entered into the medical record, reviewed, and corrections made.   Review of Systems:  No fevers, chills,  night sweats, weight loss, chest pain, or shortness of breath.    Objective:    General: Speaking clearly in complete sentences without any shortness of breath.  Alert and oriented x3.  Normal judgment. No apparent acute distress.  Impression and Recommendations:    1. Acute bacterial rhinosinusitis Instructions for supportive care and script sent due to history of requiring antibiotic therapy. Pt to f/u if symptoms worsen or fail to improve.   - amoxicillin-clavulanate (AUGMENTIN) 875-125 MG tablet; Take 1 tablet by mouth 2 (two) times daily.  Dispense: 20 tablet; Refill: 0  I discussed the assessment and treatment plan with the patient. The patient was provided an opportunity to ask questions and all were answered. The patient agreed with the plan and demonstrated an understanding of the instructions.   The patient was advised to call back or seek an in-person evaluation if the symptoms worsen or if the condition fails to improve as anticipated.  I provided 15 minutes of non-face-to-face time during this encounter.   Orma Render, NP

## 2019-08-15 NOTE — Patient Instructions (Signed)
Thank you for allowing me to provide care for you today. It was a pleasure speaking with you.  I have called in the antibiotic Augmentin for you to your pharmacy. Please be sure to complete the entire prescription, even if you feel better after a few doses, to ensure the medication works as it should.  In addition to the antibiotic, you may try the following to help with your symptoms:  *  Warm compresses to the face to help with sinus pain/pressure  *  Saline nasal spray for nasal congestion  * Warm salt water gargles for sore throat relief  * Acetaminophen (Tylenol) 500-1000mg  every 6-8 hours (max 3000mg /day) and/or Ibuprofen  (Advil/Motrin) 200-400mg  every 4-6 hours (max 1200mg /day) for pain and fever  * Guaifenesin (Mucinex) and Dextromethorphan (Delsym) to help decrease mucous production and cough(follow dosing on packaging, as this varies greatly based on regular or extended relief versions)  * Fluticasone (Flonase) nasal spray to reduce nasal inflammation  Drink plenty of fluids and rest as much as possible. If you are not feeling better in a few days or begin to experience worsening symptoms please let us know.

## 2019-09-18 DIAGNOSIS — E7800 Pure hypercholesterolemia, unspecified: Secondary | ICD-10-CM | POA: Diagnosis not present

## 2019-09-18 LAB — LIPID PANEL
Cholesterol: 146 mg/dL (ref <200)
HDL: 68 mg/dL (ref >=50)
LDL Cholesterol (Calc): 63 mg/dL (calc)
Non-HDL Cholesterol (Calc): 78 mg/dL (calc) (ref <130)
Total CHOL/HDL Ratio: 2.1 (calc) (ref <5.0)
Triglycerides: 69 mg/dL (ref <150)

## 2019-09-18 LAB — HEPATIC FUNCTION PANEL
AG Ratio: 1.8 (calc) (ref 1.0–2.5)
ALT: 13 U/L (ref 6–29)
AST: 12 U/L (ref 10–35)
Albumin: 4 g/dL (ref 3.6–5.1)
Alkaline phosphatase (APISO): 93 U/L (ref 37–153)
Bilirubin, Direct: 0.2 mg/dL (ref 0.0–0.2)
Globulin: 2.2 g/dL (calc) (ref 1.9–3.7)
Indirect Bilirubin: 0.6 mg/dL (calc) (ref 0.2–1.2)
Total Bilirubin: 0.8 mg/dL (ref 0.2–1.2)
Total Protein: 6.2 g/dL (ref 6.1–8.1)

## 2019-09-22 ENCOUNTER — Other Ambulatory Visit: Payer: Self-pay | Admitting: Family Medicine

## 2019-09-22 DIAGNOSIS — R7301 Impaired fasting glucose: Secondary | ICD-10-CM

## 2019-10-18 DIAGNOSIS — E785 Hyperlipidemia, unspecified: Secondary | ICD-10-CM | POA: Diagnosis not present

## 2019-10-18 DIAGNOSIS — E669 Obesity, unspecified: Secondary | ICD-10-CM | POA: Diagnosis not present

## 2019-10-18 DIAGNOSIS — R7303 Prediabetes: Secondary | ICD-10-CM | POA: Diagnosis not present

## 2019-10-18 DIAGNOSIS — I2510 Atherosclerotic heart disease of native coronary artery without angina pectoris: Secondary | ICD-10-CM | POA: Diagnosis not present

## 2019-10-18 DIAGNOSIS — G8929 Other chronic pain: Secondary | ICD-10-CM | POA: Diagnosis not present

## 2019-10-18 DIAGNOSIS — M1990 Unspecified osteoarthritis, unspecified site: Secondary | ICD-10-CM | POA: Diagnosis not present

## 2019-10-18 DIAGNOSIS — I10 Essential (primary) hypertension: Secondary | ICD-10-CM | POA: Diagnosis not present

## 2019-10-18 DIAGNOSIS — Z7982 Long term (current) use of aspirin: Secondary | ICD-10-CM | POA: Diagnosis not present

## 2019-10-18 DIAGNOSIS — Z008 Encounter for other general examination: Secondary | ICD-10-CM | POA: Diagnosis not present

## 2019-10-18 DIAGNOSIS — Z791 Long term (current) use of non-steroidal anti-inflammatories (NSAID): Secondary | ICD-10-CM | POA: Diagnosis not present

## 2019-10-18 DIAGNOSIS — I739 Peripheral vascular disease, unspecified: Secondary | ICD-10-CM | POA: Diagnosis not present

## 2019-10-27 ENCOUNTER — Telehealth: Payer: Self-pay

## 2019-10-27 DIAGNOSIS — I999 Unspecified disorder of circulatory system: Secondary | ICD-10-CM

## 2019-10-27 NOTE — Telephone Encounter (Signed)
OK, we can schedule for ABIs since her screening teset was abnormal.  I placed order. Please print and give to who ever is scheduling.

## 2019-10-27 NOTE — Telephone Encounter (Signed)
Patient advised and scheduled for follow up.

## 2019-10-27 NOTE — Telephone Encounter (Signed)
Printed and gave to Cindy.  

## 2019-10-27 NOTE — Telephone Encounter (Signed)
Bernard called and left a message stating the insurance company gave her a PAD test. She reported bilateral possible blockage.

## 2019-10-31 ENCOUNTER — Ambulatory Visit (HOSPITAL_BASED_OUTPATIENT_CLINIC_OR_DEPARTMENT_OTHER)
Admission: RE | Admit: 2019-10-31 | Discharge: 2019-10-31 | Disposition: A | Discharge status: Home / Self Care | Payer: Medicare HMO | Source: Ambulatory Visit | Attending: Family Medicine | Admitting: Family Medicine

## 2019-10-31 ENCOUNTER — Other Ambulatory Visit: Payer: Self-pay

## 2019-10-31 DIAGNOSIS — I999 Unspecified disorder of circulatory system: Secondary | ICD-10-CM | POA: Diagnosis not present

## 2019-10-31 NOTE — Progress Notes (Signed)
  Echocardiogram 2D Echocardiogram has been performed.  Cardell Peach 10/31/2019, 2:42 PM

## 2019-11-01 ENCOUNTER — Encounter: Payer: Self-pay | Admitting: Family Medicine

## 2019-11-01 ENCOUNTER — Ambulatory Visit (INDEPENDENT_AMBULATORY_CARE_PROVIDER_SITE_OTHER): Payer: Medicare HMO | Admitting: Family Medicine

## 2019-11-01 VITALS — BP 131/86 | HR 76 | Ht 61.0 in | Wt 186.0 lb

## 2019-11-01 DIAGNOSIS — M170 Bilateral primary osteoarthritis of knee: Secondary | ICD-10-CM

## 2019-11-01 DIAGNOSIS — K2100 Gastro-esophageal reflux disease with esophagitis, without bleeding: Secondary | ICD-10-CM

## 2019-11-01 DIAGNOSIS — Z23 Encounter for immunization: Secondary | ICD-10-CM | POA: Diagnosis not present

## 2019-11-01 DIAGNOSIS — Z1211 Encounter for screening for malignant neoplasm of colon: Secondary | ICD-10-CM

## 2019-11-01 DIAGNOSIS — R7301 Impaired fasting glucose: Secondary | ICD-10-CM | POA: Diagnosis not present

## 2019-11-01 DIAGNOSIS — K2270 Barrett's esophagus without dysplasia: Secondary | ICD-10-CM

## 2019-11-01 DIAGNOSIS — Z1231 Encounter for screening mammogram for malignant neoplasm of breast: Secondary | ICD-10-CM | POA: Diagnosis not present

## 2019-11-01 LAB — POCT GLYCOSYLATED HEMOGLOBIN (HGB A1C): Hemoglobin A1C: 6.3 % — AB (ref 4.0–5.6)

## 2019-11-01 MED ORDER — TETANUS-DIPHTH-ACELL PERTUSSIS 5-2.5-18.5 LF-MCG/0.5 IM SUSP: 0.5000 mL | Freq: Once | INTRAMUSCULAR | 0 refills | Status: AC

## 2019-11-01 NOTE — Assessment & Plan Note (Signed)
Refer for nerve ablation since she does not want to have knee replacement.

## 2019-11-01 NOTE — Assessment & Plan Note (Signed)
Due for referral back to Dr. Glennon Hamilton, GI for follow-up of Barrett's esophagus.

## 2019-11-01 NOTE — Assessment & Plan Note (Signed)
1 see did go up slightly to 6.3 today.  Technically still in the prediabetes range.  Still taking her Metformin and feels like she is doing good overall with her diet but she has been eating a fair amount of crackers lately so we discussed cutting back on that especially since she is not able to exercise regularly like she used to.  Plan to follow-up in 6 months again.

## 2019-11-01 NOTE — Progress Notes (Signed)
Established Patient Office Visit  Subjective:  Patient ID: Rachel Carr, female    DOB: 1950/09/08  Age: 69 y.o. MRN: [id number]  CC:  Chief Complaint  Patient presents with  . Gastroesophageal Reflux  . ifg    HPI [name] presents for   GERD -she does have a history of reflux as well as Barrett's esophagus.  She is due for follow-up with GI again it has been 5 years.  Needs new referral placed to Dr. Glennon Hamilton.  Impaired fasting glucose-no increased thirst or urination. No symptoms consistent with hypoglycemia.  She is interested in getting her tetanus up-to-date.  She also wanted to discuss her knee pain.  It was recommended that she have knee replacement but she really does not want to have it done she has had 2 family members that have not had good outcomes after knee replacement so she is more interested in nerve ablation.  She had initially consulted with pain management and they had discussed moving forward with ablation but then her insurance changed and unfortunately she received a letter notifying her that they are out of network and will now need a new referral.   Past Medical History:  Diagnosis Date  . Barrett's syndrome   . CAD (coronary artery disease)   . Esophageal stricture   . Esophageal ulcer 09-2010   gastritis Dr. Glennon Hamilton  . Gastric polyps    colonoscopy 2012(due again for repeat 2017)  . Shingles    on the right eye    Past Surgical History:  Procedure Laterality Date  . ABDOMINAL HYSTERECTOMY     endometriosis  . CARPAL TUNNEL RELEASE    . CHOLECYSTECTOMY    . NISSEN FUNDOPLICATION  0000000  . RADIOFREQUENCY ABLATION    . RT knee surgery      Family History  Problem Relation Age of Onset  . Heart disease Mother   . Diabetes Father   . Cancer Father        prostrate  . Cancer Sister        esophagus  . Diabetes Sister   . Cancer Brother        kidney    Social History   Socioeconomic History  . Marital status: Married    Spouse name: Not on file  . Number of children: 1  . Years of education: Not on file  . Highest education level: Not on file  Occupational History  . Not on file  Tobacco Use  . Smoking status: Former Smoker    Packs/day: 0.50    Years: 20.00    Pack years: 10.00  . Smokeless tobacco: Never Used  . Tobacco comment: Quit in 2017  Substance and Sexual Activity  . Alcohol use: No  . Drug use: No  . 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  Other Topics Concern  . Not on file  Social History Narrative  . Not on file   Social Determinants of Health   Financial Resource Strain:   . Difficulty of Paying Living Expenses:   Food Insecurity:   . Worried About Charity fundraiser in the Last Year:   . Arboriculturist in the Last Year:   Transportation Needs:   . Film/video editor (Medical):   Marland Kitchen Lack of Transportation (Non-Medical):   Physical Activity:   . Days of Exercise per Week:   . Minutes of Exercise per Session:   Stress:   .  Feeling of Stress :   Social Connections:   . Frequency of Communication with Friends and Family:   . Frequency of Social Gatherings with Friends and Family:   . Attends Religious Services:   . Active Member of Clubs or Organizations:   . Attends Archivist Meetings:   Marland Kitchen Marital Status:   Intimate Partner Violence:   . Fear of Current or Ex-Partner:   . Emotionally Abused:   Marland Kitchen Physically Abused:   . Sexually Abused:     Outpatient Medications Prior to Visit  Medication Sig Dispense Refill  . albuterol (VENTOLIN HFA) 108 (90 Base) MCG/ACT inhaler INHALE 2 PUFFS INTO THE LUNGS EVERY 4 (FOUR) HOURS AS NEEDED FOR WHEEZING 18 g prn  . atorvastatin (LIPITOR) 80 MG tablet TAKE ONE TABLET (80 MG TOTAL) BY MOUTH AT BEDTIME. 90 tablet 3  . carvedilol (COREG) 6.25 MG tablet Take 1 tablet (6.25 mg total) by mouth 2 (two) times daily with a meal. 60 tablet 11  . [facility] CH[facility] ASPIRIN 81 MG chewable tablet  Chew 1 tablet by mouth.  11  . DULoxetine (CYMBALTA) 30 MG capsule TAKE 2 CAPSULES BY MOUTH EVERY DAY 180 capsule 3  . ezetimibe (ZETIA) 10 MG tablet Take 1 tablet (10 mg total) by mouth daily. 90 tablet 3  . GARLIC PO Take XX123456 mg by mouth daily.    . metFORMIN (GLUCOPHAGE-XR) 500 MG 24 hr tablet TAKE 1 TABLET BY MOUTH EVERY DAY WITH BREAKFAST 90 tablet 1  . amoxicillin-clavulanate (AUGMENTIN) 875-125 MG tablet Take 1 tablet by mouth 2 (two) times daily. 20 tablet 0   No facility-administered medications prior to visit.    Allergies  Allergen Reactions  . Tramadol Nausea Only    ROS Review of Systems    Objective:    Physical Exam  Constitutional: She is oriented to person, place, and time. She appears well-developed and well-nourished.  HENT:  Head: Normocephalic and atraumatic.  Eyes: Conjunctivae are normal.  Cardiovascular: Normal rate, regular rhythm and normal heart sounds.  Pulmonary/Chest: Effort normal and breath sounds normal.  Neurological: She is alert and oriented to person, place, and time.  Skin: Skin is warm and dry.  Psychiatric: She has a normal mood and affect. Her behavior is normal.    BP 131/86   Pulse 76   Ht 5\' 1"  (1.549 m)   Wt 186 lb (84.4 kg)   SpO2 100%   BMI 35.14 kg/m  Wt Readings from Last 3 Encounters:  11/01/19 186 lb (84.4 kg)  08/15/19 177 lb (80.3 kg)  06/07/19 186 lb 1.9 oz (84.4 kg)     There are no preventive care reminders to display for this patient.  There are no preventive care reminders to display for this patient.  Lab Results  Component Value Date   TSH 0.79 10/25/2017   Lab Results  Component Value Date   WBC 6.2 02/25/2016   HGB 12.6 02/25/2016   HCT 37 02/25/2016   MCV 93.1 11/19/2015   PLT 247 11/19/2015   Lab Results  Component Value Date   NA 137 03/28/2019   K 4.7 03/28/2019   CO2 28 03/28/2019   GLUCOSE 111 (H) 03/28/2019   BUN 12 03/28/2019   CREATININE 0.82 03/28/2019   BILITOT 0.8  09/18/2019   ALKPHOS 94 09/29/2016   AST 12 09/18/2019   ALT 13 09/18/2019   PROT 6.2 09/18/2019   ALBUMIN 4.0 09/29/2016   CALCIUM 9.3 03/28/2019   Lab Results  Component Value Date   CHOL 146 09/18/2019   Lab Results  Component Value Date   HDL 68 09/18/2019   Lab Results  Component Value Date   LDLCALC 63 09/18/2019   Lab Results  Component Value Date   TRIG 69 09/18/2019   Lab Results  Component Value Date   CHOLHDL 2.1 09/18/2019   Lab Results  Component Value Date   HGBA1C 6.3 (A) 11/01/2019      Assessment & Plan:   Problem List Items Addressed This Visit      Digestive   GERD   Barrett's esophagus (Chronic)    Due for referral back to Dr. Glennon Hamilton, GI for follow-up of Barrett's esophagus.        Endocrine   IFG (impaired fasting glucose) - Primary    1 see did go up slightly to 6.3 today.  Technically still in the prediabetes range.  Still taking her Metformin and feels like she is doing good overall with her diet but she has been eating a fair amount of crackers lately so we discussed cutting back on that especially since she is not able to exercise regularly like she used to.  Plan to follow-up in 6 months again.      Relevant Orders   POCT glycosylated hemoglobin (Hb A1C) (Completed)     Musculoskeletal and Integument   Bilateral primary osteoarthritis of knee    Refer for nerve ablation since she does not want to have knee replacement.       Other Visit Diagnoses    Screening mammogram, encounter for       Relevant Orders   MM 3D SCREEN BREAST BILATERAL   Need for tetanus, diphtheria, and acellular pertussis (Tdap) vaccine in patient of adolescent age or older       Relevant Medications   Tdap (BOOSTRIX) 5-2.5-18.5 LF-MCG/0.5 injection   Screen for colon cancer       Relevant Orders   Ambulatory referral to Gastroenterology       Meds ordered this encounter  Medications  . Tdap (BOOSTRIX) 5-2.5-18.5 LF-MCG/0.5 injection    Sig:  Inject 0.5 mLs into the muscle once for 1 dose.    Dispense:  0.5 mL    Refill:  0    Follow-up: Return in about 6 months (around 05/03/2020) for Prediabetes .    Beatrice Lecher, MD

## 2019-11-02 ENCOUNTER — Telehealth: Payer: Self-pay | Admitting: Cardiology

## 2019-11-02 ENCOUNTER — Other Ambulatory Visit: Payer: Self-pay

## 2019-11-02 ENCOUNTER — Ambulatory Visit (INDEPENDENT_AMBULATORY_CARE_PROVIDER_SITE_OTHER): Payer: Medicare HMO

## 2019-11-02 DIAGNOSIS — Z1231 Encounter for screening mammogram for malignant neoplasm of breast: Secondary | ICD-10-CM | POA: Diagnosis not present

## 2019-11-02 NOTE — Telephone Encounter (Signed)
Will forward to reading physician to review/comment and make changes as necessary. Kirk Ruths

## 2019-11-02 NOTE — Telephone Encounter (Signed)
Tonya from Dr. Gardiner Ramus office calling for clarification on the patient's vascular test summary.

## 2019-11-02 NOTE — Telephone Encounter (Signed)
Spoke with Dr. Gardiner Ramus office. Dr. Madilyn Fireman would like Vas [place] clarified. She reports the summary contradicts itself. She wants to know if the left toe is normal and the right toe is abnormal. Will send to MD for review.

## 2019-12-01 ENCOUNTER — Other Ambulatory Visit: Payer: Self-pay | Admitting: Cardiology

## 2020-01-02 ENCOUNTER — Encounter: Payer: Self-pay | Admitting: Family Medicine

## 2020-01-03 NOTE — Telephone Encounter (Signed)
Please see my chart note.  See if we can maybe forward her the information for digestive health to call herself.

## 2020-01-29 DIAGNOSIS — M1711 Unilateral primary osteoarthritis, right knee: Secondary | ICD-10-CM | POA: Diagnosis not present

## 2020-01-29 DIAGNOSIS — M1712 Unilateral primary osteoarthritis, left knee: Secondary | ICD-10-CM | POA: Diagnosis not present

## 2020-01-31 DIAGNOSIS — R69 Illness, unspecified: Secondary | ICD-10-CM | POA: Diagnosis not present

## 2020-03-17 ENCOUNTER — Other Ambulatory Visit: Payer: Self-pay | Admitting: Family Medicine

## 2020-03-17 DIAGNOSIS — R7301 Impaired fasting glucose: Secondary | ICD-10-CM

## 2020-03-26 DIAGNOSIS — M1712 Unilateral primary osteoarthritis, left knee: Secondary | ICD-10-CM | POA: Diagnosis not present

## 2020-03-26 DIAGNOSIS — M1711 Unilateral primary osteoarthritis, right knee: Secondary | ICD-10-CM | POA: Diagnosis not present

## 2020-04-10 DIAGNOSIS — H52209 Unspecified astigmatism, unspecified eye: Secondary | ICD-10-CM | POA: Diagnosis not present

## 2020-04-10 DIAGNOSIS — H5213 Myopia, bilateral: Secondary | ICD-10-CM | POA: Diagnosis not present

## 2020-04-10 DIAGNOSIS — H269 Unspecified cataract: Secondary | ICD-10-CM | POA: Diagnosis not present

## 2020-04-10 DIAGNOSIS — H524 Presbyopia: Secondary | ICD-10-CM | POA: Diagnosis not present

## 2020-05-02 ENCOUNTER — Ambulatory Visit: Payer: Medicare HMO | Admitting: Family Medicine

## 2020-05-29 ENCOUNTER — Telehealth: Payer: Self-pay | Admitting: Cardiology

## 2020-05-29 NOTE — Telephone Encounter (Signed)
   [facility] Pre-operative Risk Assessment    HEARTCARE STAFF: - Please ensure there is not already an duplicate clearance open for this procedure. - Under Visit Info/Reason for Call, type in Other and utilize the format Clearance MM/DD/YY or Clearance TBD. Do not use dashes or single digits. - If request is for dental extraction, please clarify the # of teeth to be extracted.  Request for surgical clearance:  1. What type of surgery is being performed? Right knee Arthroplasty  2. When is this surgery scheduled? 06/28/20   3. What type of clearance is required (medical clearance vs. Pharmacy clearance to hold med vs. Both)? Both   4. Are there any medications that need to be held prior to surgery and how long?Asprin     5. Practice name and name of physician performing surgery? [facility]    6. What is the office phone number? [phone]   7.   What is the office fax number? [phone]  8.   Anesthesia type (None, local, MAC, general) ? Spinal    Rachel Carr 05/29/2020, 3:56 PM  _________________________________________________________________   (provider comments below)

## 2020-05-30 NOTE — Telephone Encounter (Signed)
Pt has appt with DR. Crenshaw 06/11/20. Will send clearance notes to MD for upcoming appt. I will send FYI to requesting office the pt has appt 06/11/20.

## 2020-05-30 NOTE — Telephone Encounter (Signed)
   Primary Cardiologist: Kirk Ruths, MD  Chart reviewed as part of pre-operative protocol coverage. Because of Patrese Neal Yust's past medical history and time since last visit, she will require a follow-up visit in order to better assess preoperative cardiovascular risk.  Pre-op covering staff: - Please schedule appointment and call patient to inform them. If patient already had an upcoming appointment within acceptable timeframe, please add "pre-op clearance" to the appointment notes so provider is aware. - Please contact requesting surgeon's office via preferred method (i.e, phone, fax) to inform them of need for appointment prior to surgery.  If applicable, this message will also be routed to pharmacy pool and/or primary cardiologist for input on holding anticoagulant/antiplatelet agent as requested below so that this information is available to the clearing provider at time of patient's appointment.   Pt surgery scheduled for 06/28/20.  Tami Lin Celestine Bougie, PA  05/30/2020, 8:59 AM

## 2020-05-30 NOTE — Telephone Encounter (Signed)
Will send a message to [facility] scheduling to see if they can get pt squeezed in for pre op. Looks like APP's are virtual and I cannot find an in person appt for pre op. I will send to [facility] scheduling today to reach out to the pt.

## 2020-06-05 NOTE — Progress Notes (Signed)
HPI: FU CAD.Previously followed at [facility]. Cath 7/17 showed 99 RCA and no other obstructive CAD; had PCI of RCA with DES. Last echo 7/17 showed normal LV function.ABIs March 2021 normal with abnormal toe brachial index.  Since last seen,she denies dyspnea, chest pain, palpitations or syncope.  Note she is scheduled to have bilateral knee replacement.  She has very limited functional capacity due to the pain in her knees.  She can only ambulate 10 to 20 feet without stopping because of her knees.  Current Outpatient Medications  Medication Sig Dispense Refill   albuterol (VENTOLIN HFA) 108 (90 Base) MCG/ACT inhaler INHALE 2 PUFFS INTO THE LUNGS EVERY 4 (FOUR) HOURS AS NEEDED FOR WHEEZING 18 g prn   atorvastatin (LIPITOR) 80 MG tablet TAKE ONE TABLET (80 MG TOTAL) BY MOUTH AT BEDTIME. 90 tablet 3   carvedilol (COREG) 6.25 MG tablet TAKE 1 TABLET TWICE A DAY WITH FOOD 180 tablet 1   [facility] CH[facility]DRENS ASPIRIN 81 MG chewable tablet Chew 1 tablet by mouth.  11   DULoxetine (CYMBALTA) 30 MG capsule TAKE 2 CAPSULES BY MOUTH EVERY DAY 193 capsule 3   GARLIC PO Take 7,902 mg by mouth daily.     metFORMIN (GLUCOPHAGE-XR) 500 MG 24 hr tablet TAKE 1 TABLET BY MOUTH EVERY DAY WITH BREAKFAST 90 tablet 1   ezetimibe (ZETIA) 10 MG tablet Take 1 tablet (10 mg total) by mouth daily. 90 tablet 3   No current facility-administered medications for this visit.     Past Medical History:  Diagnosis Date   Barrett's syndrome    CAD (coronary artery disease)    Esophageal stricture    Esophageal ulcer 09-2010   gastritis Dr. Glennon Hamilton   Gastric polyps    colonoscopy 2012(due again for repeat 2017)   Shingles    on the right eye    Past Surgical History:  Procedure Laterality Date   ABDOMINAL HYSTERECTOMY     endometriosis   CARPAL TUNNEL RELEASE     CHOLECYSTECTOMY     NISSEN FUNDOPLICATION  4097   RADIOFREQUENCY ABLATION     RT knee surgery      Social History    Socioeconomic History   Marital status: Married    Spouse name: Not on file   Number of children: 1   Years of education: Not on file   Highest education level: Not on file  Occupational History   Not on file  Tobacco Use   Smoking status: Former Smoker    Packs/day: 0.50    Years: 20.00    Pack years: 10.00   Smokeless tobacco: Never Used   Tobacco comment: Quit in 2017  Vaping Use   Vaping Use: Former  Substance and Sexual Activity   Alcohol use: No   Drug use: No   Sexual activity: Not on file    CommentAstronomer, married, 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  Other Topics Concern   Not on file  Social History Narrative   Not on file   Social Determinants of Health   Financial Resource Strain:    Difficulty of Paying Living Expenses: Not on file  Food Insecurity:    Worried About [place] in the Last Year: Not on file   Ran Out of Food in the Last Year: Not on file  Transportation Needs:    Lack of Transportation (Medical): Not on file   Lack of Transportation (Non-Medical): Not on file  Physical Activity:    Days of  Exercise per Week: Not on file   Minutes of Exercise per Session: Not on file  Stress:    Feeling of Stress : Not on file  Social Connections:    Frequency of Communication with Friends and Family: Not on file   Frequency of Social Gatherings with Friends and Family: Not on file   Attends Religious Services: Not on file   Active Member of Clubs or Organizations: Not on file   Attends Archivist Meetings: Not on file   Marital Status: Not on file  Intimate Partner Violence:    Fear of Current or Ex-Partner: Not on file   Emotionally Abused: Not on file   Physically Abused: Not on file   Sexually Abused: Not on file    Family History  Problem Relation Age of Onset   Heart disease Mother    Diabetes Father    Cancer Father        prostrate   Cancer Sister        esophagus   Diabetes Sister     Cancer Brother        kidney    ROS: Knee arthralgias but no fevers or chills, productive cough, hemoptysis, dysphasia, odynophagia, melena, hematochezia, dysuria, hematuria, rash, seizure activity, orthopnea, PND, pedal edema, claudication. Remaining systems are negative.  Physical Exam: Well-developed well-nourished in no acute distress.  Skin is warm and dry.  HEENT is normal.  Neck is supple.  Chest is clear to auscultation with normal expansion.  Cardiovascular exam is regular rate and rhythm.  Abdominal exam nontender or distended. No masses palpated. Extremities show no edema. neuro grossly intact  ECG-sinus rhythm at a rate of 71, no ST changes.  Personally reviewed  A/P  1 coronary artery disease-patient denies chest pain.  Continue aspirin and statin.  2 hypertension-blood pressure is controlled today.  Continue present medical regimen.  3 hyperlipidemia-continue Lipitor and Zetia.  Check lipids and liver.  4 Pedal edema-unchanged.  Continue feet elevation and low-sodium diet.  We will consider a diuretic in the future if needed.  5 preoperative evaluation-patient is scheduled to have bilateral knee replacement in the near future.  She has very limited functional capacity due to pain in her knees prohibiting her activities.  We will arrange a [place] nuclear study for risk stratification.  If normal or low risk she may proceed.  Kirk Ruths, MD

## 2020-06-07 ENCOUNTER — Other Ambulatory Visit: Payer: Self-pay | Admitting: Family Medicine

## 2020-06-07 DIAGNOSIS — Z86010 Personal history of colonic polyps: Secondary | ICD-10-CM | POA: Diagnosis not present

## 2020-06-07 DIAGNOSIS — K208 Other esophagitis without bleeding: Secondary | ICD-10-CM | POA: Diagnosis not present

## 2020-06-07 DIAGNOSIS — Z1211 Encounter for screening for malignant neoplasm of colon: Secondary | ICD-10-CM | POA: Diagnosis not present

## 2020-06-07 DIAGNOSIS — K2270 Barrett's esophagus without dysplasia: Secondary | ICD-10-CM | POA: Diagnosis not present

## 2020-06-07 DIAGNOSIS — K5730 Diverticulosis of large intestine without perforation or abscess without bleeding: Secondary | ICD-10-CM | POA: Diagnosis not present

## 2020-06-07 DIAGNOSIS — K2100 Gastro-esophageal reflux disease with esophagitis, without bleeding: Secondary | ICD-10-CM | POA: Diagnosis not present

## 2020-06-07 DIAGNOSIS — K6389 Other specified diseases of intestine: Secondary | ICD-10-CM | POA: Diagnosis not present

## 2020-06-07 DIAGNOSIS — D120 Benign neoplasm of cecum: Secondary | ICD-10-CM | POA: Diagnosis not present

## 2020-06-07 LAB — HM COLONOSCOPY

## 2020-06-11 ENCOUNTER — Encounter: Payer: Self-pay | Admitting: Cardiology

## 2020-06-11 ENCOUNTER — Ambulatory Visit: Payer: Medicare HMO | Admitting: Cardiology

## 2020-06-11 ENCOUNTER — Encounter (HOSPITAL_COMMUNITY): Payer: Self-pay

## 2020-06-11 ENCOUNTER — Other Ambulatory Visit: Payer: Self-pay

## 2020-06-11 VITALS — BP 123/70 | HR 71 | Temp 95.0°F | Ht 61.0 in | Wt 188.6 lb

## 2020-06-11 DIAGNOSIS — Z01818 Encounter for other preprocedural examination: Secondary | ICD-10-CM

## 2020-06-11 DIAGNOSIS — I10 Essential (primary) hypertension: Secondary | ICD-10-CM | POA: Diagnosis not present

## 2020-06-11 DIAGNOSIS — E7800 Pure hypercholesterolemia, unspecified: Secondary | ICD-10-CM | POA: Diagnosis not present

## 2020-06-11 DIAGNOSIS — I2510 Atherosclerotic heart disease of native coronary artery without angina pectoris: Secondary | ICD-10-CM | POA: Diagnosis not present

## 2020-06-11 NOTE — Patient Instructions (Signed)
  Testing/Procedures:  Your physician has requested that you have a lexiscan myoview. For further information please visit [email or web address]. Please follow instruction sheet, as given.[place]     Follow-Up: At [facility], you and your health needs are our priority.  As part of our continuing mission to provide you with exceptional heart care, we have created designated Provider Care Teams.  These Care Teams include your primary Cardiologist (physician) and Advanced Practice Providers (APPs -  Physician Assistants and Nurse Practitioners) who all work together to provide you with the care you need, when you need it.  We recommend signing up for the patient portal called "MyChart".  Sign up information is provided on this After Visit Summary.  MyChart is used to connect with patients for Virtual Visits (Telemedicine).  Patients are able to view lab/test results, encounter notes, upcoming appointments, etc.  Non-urgent messages can be sent to your provider as well.   To learn more about what you can do with MyChart, go to [email or web address].    Your next appointment:   12 month(s)  The format for your next appointment:   In Person  Provider:   Kirk Ruths, MD  [place]

## 2020-06-12 ENCOUNTER — Ambulatory Visit (INDEPENDENT_AMBULATORY_CARE_PROVIDER_SITE_OTHER): Payer: Medicare HMO | Admitting: Family Medicine

## 2020-06-12 ENCOUNTER — Encounter (HOSPITAL_COMMUNITY): Payer: Self-pay | Admitting: *Deleted

## 2020-06-12 ENCOUNTER — Telehealth (HOSPITAL_COMMUNITY): Payer: Self-pay | Admitting: *Deleted

## 2020-06-12 ENCOUNTER — Encounter: Payer: Self-pay | Admitting: Family Medicine

## 2020-06-12 VITALS — BP 121/64 | HR 71 | Ht 61.0 in | Wt 187.0 lb

## 2020-06-12 DIAGNOSIS — Z01818 Encounter for other preprocedural examination: Secondary | ICD-10-CM | POA: Diagnosis not present

## 2020-06-12 DIAGNOSIS — R7301 Impaired fasting glucose: Secondary | ICD-10-CM | POA: Diagnosis not present

## 2020-06-12 DIAGNOSIS — Z23 Encounter for immunization: Secondary | ICD-10-CM | POA: Diagnosis not present

## 2020-06-12 DIAGNOSIS — R69 Illness, unspecified: Secondary | ICD-10-CM | POA: Diagnosis not present

## 2020-06-12 LAB — POCT GLYCOSYLATED HEMOGLOBIN (HGB A1C): Hemoglobin A1C: 6.7 % — AB (ref 4.0–5.6)

## 2020-06-12 LAB — CBC WITH DIFFERENTIAL/PLATELET
Basophils Relative: 0.9 %
Eosinophils Relative: 2.1 %
Neutrophils Relative %: 56.5 %
RDW: 12.7 % (ref 11.0–15.0)

## 2020-06-12 NOTE — Assessment & Plan Note (Signed)
She is cleared from a medical standpoint for surgery for right knee replacement under spinal anesthesia.  Her cardiologist is evaluating her for cardiac clearance.  Her A1c is at goal, her BMI is less than 40, and blood pressure looks great today.  We will make sure that labs are appropriate.  CBC with differential, Chem-7, PT/INR.  A1c 6.7 today.

## 2020-06-12 NOTE — Telephone Encounter (Signed)
Left message on voicemail in reference to upcoming appointment scheduled for 06/17/2020. Phone number given for a call back so details instructions can be given. [place] Mychart letter sent with instructions

## 2020-06-12 NOTE — Progress Notes (Signed)
Established Patient Office Visit  Subjective:  Patient ID: Rachel Carr, female    DOB: 05-23-1951  Age: 69 y.o. MRN: [id number]  CC:  Chief Complaint  Patient presents with  . surgical clearance    HPI Rachel Carr presents for preoperative medical clearance.  She has a history of coronary artery disease status post percutaneous coronary angioplasty, GERD, impaired fasting glucose, and BMI of 35.  She is scheduled for right knee replacement with Dr. Frederik Pear.  Last saw cardiologist, Dr. Kirk Ruths, yesterday for cardiac clearance.   Coronary artery disease was stable.  He is scheduled for stress test on Monday.  Otherwise doing well.  No recent changes to her medication.  She is never had complications or problems with surgery previously.  No problems with anesthesia.  She says she is only able to walk about 20 to 25 feet before her knee starts to hurt and she has to stop but it is not because of chest pain or breathing problems.  She does report a little sinus congestion for the last 2-day.  She says she always gets a sinus infection in the fall she has been using a little bit of Flonase here there.  No fever chills or sweats.  No cough.  She has had a complete hysterectomy and she is also had a bilateral mastectomy.  Past Medical History:  Diagnosis Date  . Barrett's syndrome   . CAD (coronary artery disease)   . Esophageal stricture   . Esophageal ulcer 09-2010   gastritis Dr. Glennon Hamilton  . Gastric polyps    colonoscopy 2012(due again for repeat 2017)  . Shingles    on the right eye    Past Surgical History:  Procedure Laterality Date  . CARPAL TUNNEL RELEASE    . CHOLECYSTECTOMY    . COMPLETE MASTECTOMY W/ SENTINEL NODE BIOPSY    . NISSEN FUNDOPLICATION  4742  . RADIOFREQUENCY ABLATION    . RT knee surgery    . TOTAL ABDOMINAL HYSTERECTOMY W/ BILATERAL SALPINGOOPHORECTOMY     endometriosis    Family History  Problem Relation Age of Onset  . Heart  disease Mother   . Diabetes Father   . Cancer Father        prostrate  . Cancer Sister        esophagus  . Diabetes Sister   . Cancer Brother        kidney    Social History   Socioeconomic History  . Marital status: Married    Spouse name: Not on file  . Number of children: 1  . Years of education: Not on file  . Highest education level: Not on file  Occupational History  . Not on file  Tobacco Use  . Smoking status: Former Smoker    Packs/day: 0.50    Years: 20.00    Pack years: 10.00  . Smokeless tobacco: Never Used  . Tobacco comment: Quit in 2017  Vaping Use  . Vaping Use: Former  Substance and Sexual Activity  . Alcohol use: No  . Drug use: No  . 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  Other Topics Concern  . Not on file  Social History Narrative  . Not on file   Social Determinants of Health   Financial Resource Strain:   . Difficulty of Paying Living Expenses: Not on file  Food Insecurity:   . Worried About Charity fundraiser in the  Last Year: Not on file  . Ran Out of Food in the Last Year: Not on file  Transportation Needs:   . Lack of Transportation (Medical): Not on file  . Lack of Transportation (Non-Medical): Not on file  Physical Activity:   . Days of Exercise per Week: Not on file  . Minutes of Exercise per Session: Not on file  Stress:   . Feeling of Stress : Not on file  Social Connections:   . Frequency of Communication with Friends and Family: Not on file  . Frequency of Social Gatherings with Friends and Family: Not on file  . Attends Religious Services: Not on file  . Active Member of Clubs or Organizations: Not on file  . Attends Archivist Meetings: Not on file  . Marital Status: Not on file  Intimate Partner Violence:   . Fear of Current or Ex-Partner: Not on file  . Emotionally Abused: Not on file  . Physically Abused: Not on file  . Sexually Abused: Not on file    Outpatient  Medications Prior to Visit  Medication Sig Dispense Refill  . pantoprazole (PROTONIX) 40 MG tablet Take 40 mg by mouth daily.    Marland Kitchen albuterol (VENTOLIN HFA) 108 (90 Base) MCG/ACT inhaler INHALE 2 PUFFS INTO THE LUNGS EVERY 4 (FOUR) HOURS AS NEEDED FOR WHEEZING 18 g prn  . atorvastatin (LIPITOR) 80 MG tablet TAKE ONE TABLET (80 MG TOTAL) BY MOUTH AT BEDTIME. 90 tablet 3  . carvedilol (COREG) 6.25 MG tablet TAKE 1 TABLET TWICE A DAY WITH FOOD 180 tablet 1  . [employer] CHILDRENS ASPIRIN 81 MG chewable tablet Chew 1 tablet by mouth.  11  . DULoxetine (CYMBALTA) 30 MG capsule TAKE 2 CAPSULES BY MOUTH EVERY DAY 180 capsule 3  . GARLIC PO Take 8,185 mg by mouth daily.    . metFORMIN (GLUCOPHAGE-XR) 500 MG 24 hr tablet TAKE 1 TABLET BY MOUTH EVERY DAY WITH BREAKFAST 90 tablet 1  . ezetimibe (ZETIA) 10 MG tablet Take 1 tablet (10 mg total) by mouth daily. 90 tablet 3   No facility-administered medications prior to visit.    Allergies  Allergen Reactions  . Tramadol Nausea Only    ROS Review of Systems    Objective:    Physical Exam Constitutional:      Appearance: She is well-developed.  HENT:     Head: Normocephalic and atraumatic.     Right Ear: Tympanic membrane, ear canal and external ear normal.     Left Ear: Ear canal and external ear normal.     Ears:     Comments: Left TM is blocked by cerumen.     Nose: Nose normal.     Mouth/Throat:     Mouth: Mucous membranes are moist.     Pharynx: Oropharynx is clear. No oropharyngeal exudate or posterior oropharyngeal erythema.  Eyes:     Conjunctiva/sclera: Conjunctivae normal.     Pupils: Pupils are equal, round, and reactive to light.  Neck:     Thyroid: No thyromegaly.  Cardiovascular:     Rate and Rhythm: Normal rate and regular rhythm.     Heart sounds: Normal heart sounds.     Comments: No carotid bruits.  Pulmonary:     Effort: Pulmonary effort is normal.     Breath sounds: Normal breath sounds. No wheezing.  Abdominal:      General: Abdomen is flat. Bowel sounds are normal.     Palpations: Abdomen is soft.  Musculoskeletal:  General: No swelling.     Cervical back: Neck supple.  Lymphadenopathy:     Cervical: No cervical adenopathy.  Skin:    General: Skin is warm and dry.  Neurological:     Mental Status: She is alert and oriented to person, place, and time.  Psychiatric:        Mood and Affect: Mood normal.        Behavior: Behavior normal.        Thought Content: Thought content normal.     BP 121/64   Pulse 71   Ht 5\' 1"  (1.549 m)   Wt 187 lb (84.8 kg)   SpO2 100%   BMI 35.33 kg/m  Wt Readings from Last 3 Encounters:  06/12/20 187 lb (84.8 kg)  06/11/20 188 lb 9.6 oz (85.5 kg)  11/01/19 186 lb (84.4 kg)     Health Maintenance Due  Topic Date Due  . COVID-19 Vaccine (1) Never done  . COLONOSCOPY  05/10/2020    There are no preventive care reminders to display for this patient.  Lab Results  Component Value Date   TSH 0.79 10/25/2017   Lab Results  Component Value Date   WBC 6.2 02/25/2016   HGB 12.6 02/25/2016   HCT 37 02/25/2016   MCV 93.1 11/19/2015   PLT 247 11/19/2015   Lab Results  Component Value Date   NA 137 03/28/2019   K 4.7 03/28/2019   CO2 28 03/28/2019   GLUCOSE 111 (H) 03/28/2019   BUN 12 03/28/2019   CREATININE 0.82 03/28/2019   BILITOT 0.8 09/18/2019   ALKPHOS 94 09/29/2016   AST 12 09/18/2019   ALT 13 09/18/2019   PROT 6.2 09/18/2019   ALBUMIN 4.0 09/29/2016   CALCIUM 9.3 03/28/2019   Lab Results  Component Value Date   CHOL 146 09/18/2019   Lab Results  Component Value Date   HDL 68 09/18/2019   Lab Results  Component Value Date   LDLCALC 63 09/18/2019   Lab Results  Component Value Date   TRIG 69 09/18/2019   Lab Results  Component Value Date   CHOLHDL 2.1 09/18/2019   Lab Results  Component Value Date   HGBA1C 6.7 (A) 06/12/2020      Assessment & Plan:   Problem List Items Addressed This Visit      Endocrine    IFG (impaired fasting glucose) - Primary    A1c up slightly today at 6.7 but still under 7 so okay for surgery.  You to work on [employer] and staying as active as she is able to be.  Follow-up in 3 to 4 months.  Lab Results  Component Value Date   HGBA1C 6.7 (A) 06/12/2020         Relevant Orders   POCT glycosylated hemoglobin (Hb A1C) (Completed)     Other   Preop general physical exam    She is cleared from a medical standpoint for surgery for right knee replacement under spinal anesthesia.  Her cardiologist is evaluating her for cardiac clearance.  Her A1c is at goal, her BMI is less than 40, and blood pressure looks great today.  We will make sure that labs are appropriate.  CBC with differential, Chem-7, PT/INR.  A1c 6.7 today.      Relevant Orders   COMPLETE METABOLIC PANEL WITH GFR   CBC with Differential/Platelet   INR/PT    Other Visit Diagnoses    Need for immunization against influenza  Relevant Orders   Flu Vaccine QUAD High Dose(Fluad) (Completed)      No orders of the defined types were placed in this encounter.   Follow-up: Return in about 6 months (around 12/10/2020) for Prediabetes and BP.   I spent 35 minutes on the day of the encounter to include pre-visit record review, face-to-face time with the patient and post visit ordering of test.   Beatrice Lecher, MD

## 2020-06-12 NOTE — Assessment & Plan Note (Signed)
A1c up slightly today at 6.7 but still under 7 so okay for surgery.  You to work on [employer] and staying as active as she is able to be.  Follow-up in 3 to 4 months.  Lab Results  Component Value Date   HGBA1C 6.7 (A) 06/12/2020

## 2020-06-13 LAB — CBC WITH DIFFERENTIAL/PLATELET
Absolute Monocytes: 714 cells/uL (ref 200–950)
Basophils Absolute: 61 cells/uL (ref 0–200)
Eosinophils Absolute: 143 cells/uL (ref 15–500)
HCT: 38.7 % (ref 35.0–45.0)
Hemoglobin: 13.4 g/dL (ref 11.7–15.5)
Lymphs Abs: 2040 cells/uL (ref 850–3900)
MCH: 32.1 pg (ref 27.0–33.0)
MCHC: 34.6 g/dL (ref 32.0–36.0)
MCV: 92.8 fL (ref 80.0–100.0)
MPV: 11.1 fL (ref 7.5–12.5)
Monocytes Relative: 10.5 %
Neutro Abs: 3842 cells/uL (ref 1500–7800)
Platelets: 264 10*3/uL (ref 140–400)
RBC: 4.17 10*6/uL (ref 3.80–5.10)
Total Lymphocyte: 30 %
WBC: 6.8 10*3/uL (ref 3.8–10.8)

## 2020-06-13 LAB — PROTIME-INR
INR: 1
Prothrombin Time: 10.7 s (ref 9.0–11.5)

## 2020-06-13 LAB — COMPLETE METABOLIC PANEL WITH GFR
AG Ratio: 2 (calc) (ref 1.0–2.5)
ALT: 12 U/L (ref 6–29)
AST: 12 U/L (ref 10–35)
Albumin: 4.1 g/dL (ref 3.6–5.1)
Alkaline phosphatase (APISO): 95 U/L (ref 37–153)
BUN: 9 mg/dL (ref 7–25)
CO2: 30 mmol/L (ref 20–32)
Calcium: 9.4 mg/dL (ref 8.6–10.4)
Chloride: 101 mmol/L (ref 98–110)
Creat: 0.79 mg/dL (ref 0.50–0.99)
GFR, Est African American: 89 mL/min/{1.73_m2} (ref >=60)
GFR, Est Non African American: 76 mL/min/{1.73_m2} (ref >=60)
Globulin: 2.1 g/dL (calc) (ref 1.9–3.7)
Glucose, Bld: 95 mg/dL (ref 65–99)
Potassium: 4.8 mmol/L (ref 3.5–5.3)
Sodium: 138 mmol/L (ref 135–146)
Total Bilirubin: 0.8 mg/dL (ref 0.2–1.2)
Total Protein: 6.2 g/dL (ref 6.1–8.1)

## 2020-06-14 ENCOUNTER — Telehealth (HOSPITAL_COMMUNITY): Payer: Self-pay

## 2020-06-14 ENCOUNTER — Encounter (HOSPITAL_COMMUNITY): Payer: Medicare HMO

## 2020-06-14 NOTE — Telephone Encounter (Signed)
Patient given detailed instructions per Myocardial Perfusion Study Information Sheet for the test on 11/8 at 0745. Patient notified to arrive 15 minutes early and that it is imperative to arrive on time for appointment to keep from having the test rescheduled.  If you need to cancel or reschedule your appointment, please call the office within 24 hours of your appointment. . Patient verbalized understanding.TMY

## 2020-06-17 ENCOUNTER — Other Ambulatory Visit: Payer: Self-pay

## 2020-06-17 ENCOUNTER — Ambulatory Visit (HOSPITAL_COMMUNITY): Payer: Medicare HMO | Attending: Internal Medicine

## 2020-06-17 DIAGNOSIS — Z01818 Encounter for other preprocedural examination: Secondary | ICD-10-CM | POA: Diagnosis present

## 2020-06-17 DIAGNOSIS — I2510 Atherosclerotic heart disease of native coronary artery without angina pectoris: Secondary | ICD-10-CM | POA: Diagnosis not present

## 2020-06-17 DIAGNOSIS — Z01810 Encounter for preprocedural cardiovascular examination: Secondary | ICD-10-CM | POA: Diagnosis not present

## 2020-06-17 LAB — MYOCARDIAL PERFUSION IMAGING
LV dias vol: 42 mL (ref 46–106)
LV sys vol: 9 mL
Peak HR: 80 {beats}/min
Rest HR: 61 {beats}/min
SDS: 0
SRS: 0
SSS: 0
TID: 1

## 2020-06-17 MED ORDER — TECHNETIUM TC 99M TETROFOSMIN IV KIT
30.4000 | PACK | Freq: Once | INTRAVENOUS | Status: AC | PRN
  Administered 2020-06-17: 30.4 via INTRAVENOUS
  Filled 2020-06-17: qty 31

## 2020-06-17 MED ORDER — TECHNETIUM TC 99M TETROFOSMIN IV KIT
10.9000 | PACK | Freq: Once | INTRAVENOUS | Status: AC | PRN
  Administered 2020-06-17: 10.9 via INTRAVENOUS
  Filled 2020-06-17: qty 11

## 2020-06-17 MED ORDER — REGADENOSON 0.4 MG/5ML IV SOLN
0.4000 mg | Freq: Once | INTRAVENOUS | Status: AC
  Administered 2020-06-17: 0.4 mg via INTRAVENOUS

## 2020-06-20 DIAGNOSIS — M1711 Unilateral primary osteoarthritis, right knee: Secondary | ICD-10-CM | POA: Diagnosis not present

## 2020-06-21 NOTE — Telephone Encounter (Signed)
Per Pre OP Team I have faxed over Dr. Jacalyn Lefevre ov note from 06/11/20 as well as Myoview from 06/17/20 to [place] (requesting office) for pre op clearance. I will fax this note as well. All notes faxed today have been faxed through [place].

## 2020-06-21 NOTE — Telephone Encounter (Signed)
Please fax OV note from 11/02 appt with Dr. Stanford Breed and lexiscan results from 11/08 to Wells Guiles at [facility] for cardiac clearance.

## 2020-06-28 DIAGNOSIS — Z96651 Presence of right artificial knee joint: Secondary | ICD-10-CM | POA: Diagnosis not present

## 2020-06-28 DIAGNOSIS — G8918 Other acute postprocedural pain: Secondary | ICD-10-CM | POA: Diagnosis not present

## 2020-06-28 DIAGNOSIS — M21161 Varus deformity, not elsewhere classified, right knee: Secondary | ICD-10-CM | POA: Diagnosis not present

## 2020-06-28 DIAGNOSIS — M1711 Unilateral primary osteoarthritis, right knee: Secondary | ICD-10-CM | POA: Diagnosis not present

## 2020-06-29 DIAGNOSIS — K2270 Barrett's esophagus without dysplasia: Secondary | ICD-10-CM | POA: Diagnosis not present

## 2020-06-29 DIAGNOSIS — Z7984 Long term (current) use of oral hypoglycemic drugs: Secondary | ICD-10-CM | POA: Diagnosis not present

## 2020-06-29 DIAGNOSIS — R7303 Prediabetes: Secondary | ICD-10-CM | POA: Diagnosis not present

## 2020-06-29 DIAGNOSIS — M1712 Unilateral primary osteoarthritis, left knee: Secondary | ICD-10-CM | POA: Diagnosis not present

## 2020-06-29 DIAGNOSIS — Z7982 Long term (current) use of aspirin: Secondary | ICD-10-CM | POA: Diagnosis not present

## 2020-06-29 DIAGNOSIS — Z96651 Presence of right artificial knee joint: Secondary | ICD-10-CM | POA: Diagnosis not present

## 2020-06-29 DIAGNOSIS — Z471 Aftercare following joint replacement surgery: Secondary | ICD-10-CM | POA: Diagnosis not present

## 2020-07-02 DIAGNOSIS — R7303 Prediabetes: Secondary | ICD-10-CM | POA: Diagnosis not present

## 2020-07-02 DIAGNOSIS — Z96651 Presence of right artificial knee joint: Secondary | ICD-10-CM | POA: Diagnosis not present

## 2020-07-02 DIAGNOSIS — Z7982 Long term (current) use of aspirin: Secondary | ICD-10-CM | POA: Diagnosis not present

## 2020-07-02 DIAGNOSIS — M1712 Unilateral primary osteoarthritis, left knee: Secondary | ICD-10-CM | POA: Diagnosis not present

## 2020-07-02 DIAGNOSIS — Z7984 Long term (current) use of oral hypoglycemic drugs: Secondary | ICD-10-CM | POA: Diagnosis not present

## 2020-07-02 DIAGNOSIS — Z471 Aftercare following joint replacement surgery: Secondary | ICD-10-CM | POA: Diagnosis not present

## 2020-07-02 DIAGNOSIS — K2270 Barrett's esophagus without dysplasia: Secondary | ICD-10-CM | POA: Diagnosis not present

## 2020-07-03 DIAGNOSIS — Z96651 Presence of right artificial knee joint: Secondary | ICD-10-CM | POA: Diagnosis not present

## 2020-07-03 DIAGNOSIS — M1712 Unilateral primary osteoarthritis, left knee: Secondary | ICD-10-CM | POA: Diagnosis not present

## 2020-07-03 DIAGNOSIS — Z7982 Long term (current) use of aspirin: Secondary | ICD-10-CM | POA: Diagnosis not present

## 2020-07-03 DIAGNOSIS — Z7984 Long term (current) use of oral hypoglycemic drugs: Secondary | ICD-10-CM | POA: Diagnosis not present

## 2020-07-03 DIAGNOSIS — Z471 Aftercare following joint replacement surgery: Secondary | ICD-10-CM | POA: Diagnosis not present

## 2020-07-03 DIAGNOSIS — R7303 Prediabetes: Secondary | ICD-10-CM | POA: Diagnosis not present

## 2020-07-03 DIAGNOSIS — K2270 Barrett's esophagus without dysplasia: Secondary | ICD-10-CM | POA: Diagnosis not present

## 2020-07-05 DIAGNOSIS — Z7982 Long term (current) use of aspirin: Secondary | ICD-10-CM | POA: Diagnosis not present

## 2020-07-05 DIAGNOSIS — Z96651 Presence of right artificial knee joint: Secondary | ICD-10-CM | POA: Diagnosis not present

## 2020-07-05 DIAGNOSIS — K2270 Barrett's esophagus without dysplasia: Secondary | ICD-10-CM | POA: Diagnosis not present

## 2020-07-05 DIAGNOSIS — R7303 Prediabetes: Secondary | ICD-10-CM | POA: Diagnosis not present

## 2020-07-05 DIAGNOSIS — M1712 Unilateral primary osteoarthritis, left knee: Secondary | ICD-10-CM | POA: Diagnosis not present

## 2020-07-05 DIAGNOSIS — Z471 Aftercare following joint replacement surgery: Secondary | ICD-10-CM | POA: Diagnosis not present

## 2020-07-05 DIAGNOSIS — Z7984 Long term (current) use of oral hypoglycemic drugs: Secondary | ICD-10-CM | POA: Diagnosis not present

## 2020-07-08 DIAGNOSIS — K2270 Barrett's esophagus without dysplasia: Secondary | ICD-10-CM | POA: Diagnosis not present

## 2020-07-08 DIAGNOSIS — Z471 Aftercare following joint replacement surgery: Secondary | ICD-10-CM | POA: Diagnosis not present

## 2020-07-08 DIAGNOSIS — Z96651 Presence of right artificial knee joint: Secondary | ICD-10-CM | POA: Diagnosis not present

## 2020-07-08 DIAGNOSIS — Z7984 Long term (current) use of oral hypoglycemic drugs: Secondary | ICD-10-CM | POA: Diagnosis not present

## 2020-07-08 DIAGNOSIS — Z7982 Long term (current) use of aspirin: Secondary | ICD-10-CM | POA: Diagnosis not present

## 2020-07-08 DIAGNOSIS — M1712 Unilateral primary osteoarthritis, left knee: Secondary | ICD-10-CM | POA: Diagnosis not present

## 2020-07-08 DIAGNOSIS — R7303 Prediabetes: Secondary | ICD-10-CM | POA: Diagnosis not present

## 2020-07-09 ENCOUNTER — Ambulatory Visit: Payer: Medicare HMO | Admitting: Rehabilitative and Restorative Service Providers"

## 2020-07-09 DIAGNOSIS — Z471 Aftercare following joint replacement surgery: Secondary | ICD-10-CM | POA: Diagnosis not present

## 2020-07-09 DIAGNOSIS — Z96651 Presence of right artificial knee joint: Secondary | ICD-10-CM | POA: Diagnosis not present

## 2020-07-10 ENCOUNTER — Encounter: Payer: Self-pay | Admitting: Physical Therapy

## 2020-07-10 ENCOUNTER — Other Ambulatory Visit: Payer: Self-pay

## 2020-07-10 ENCOUNTER — Ambulatory Visit (INDEPENDENT_AMBULATORY_CARE_PROVIDER_SITE_OTHER): Payer: Medicare HMO | Admitting: Physical Therapy

## 2020-07-10 DIAGNOSIS — M25661 Stiffness of right knee, not elsewhere classified: Secondary | ICD-10-CM | POA: Diagnosis not present

## 2020-07-10 DIAGNOSIS — M6281 Muscle weakness (generalized): Secondary | ICD-10-CM | POA: Diagnosis not present

## 2020-07-10 DIAGNOSIS — M25561 Pain in right knee: Secondary | ICD-10-CM

## 2020-07-10 DIAGNOSIS — R2689 Other abnormalities of gait and mobility: Secondary | ICD-10-CM

## 2020-07-10 NOTE — Therapy (Signed)
[place], [place], [address] Phone: [phone]   Fax:  [phone]  Physical Therapy Evaluation  Patient Details  Name: Rachel Carr MRN: [id number] Date of Birth: [birth year] Referring Provider (PT): Frederik Pear    Encounter Date: 07/10/2020   PT End of Session - 07/10/20 1251    Visit Number 1    Number of Visits 12    Date for PT Re-Evaluation 08/07/20    Authorization Type [employer]    Authorization Time Period 07/10/20 to 08/07/20    Authorization - Visit Number 1    Authorization - Number of Visits 12    Progress Note Due on Visit 10    PT Start Time 0932    PT Stop Time 1056    PT Time Calculation (min) 40 min    Activity Tolerance Patient tolerated treatment well    Behavior During Therapy [facility] for tasks assessed/performed           Past Medical History:  Diagnosis Date   Barrett's syndrome    CAD (coronary artery disease)    Esophageal stricture    Esophageal ulcer 09-2010   gastritis Dr. Glennon Hamilton   Gastric polyps    colonoscopy 2012(due again for repeat 2017)   Shingles    on the right eye    Past Surgical History:  Procedure Laterality Date   CARPAL TUNNEL RELEASE     CHOLECYSTECTOMY     COMPLETE MASTECTOMY W/ SENTINEL NODE BIOPSY     NISSEN FUNDOPLICATION  3557   [facility]     RT knee surgery     TOTAL ABDOMINAL HYSTERECTOMY W/ BILATERAL SALPINGOOPHORECTOMY     endometriosis    There were no vitals filed for this visit.    Subjective Assessment - 07/10/20 1022    Subjective I had R total knee surgery November the 19th. I left that same day, went straight to HHPT. Had 5 visits from [place] and am now here. The L knee bothers me and I'm having a replacement done in June. The left knee has actually been bothering me more than the right and I had to modify a lot with HHPT.    How long can you sit comfortably? 60 minutes    How long can  you stand comfortably? limited by L knee, 20 minutes max    How long can you walk comfortably? household distances, 15 minutes a time    Patient Stated Goals no more pain so I can do what I want to (gardening primarily)    Currently in Pain? Yes    Pain Score 8    R knee 3/10, sore and aching   Pain Location Knee    Pain Orientation Left    Pain Descriptors / Indicators Grimacing;Guarding;Sharp    Pain Type Chronic pain    Pain Onset More than a month ago    Pain Frequency Constant    Aggravating Factors  everything bothers the left knee; nothing bothers R knee    Pain Relieving Factors nothing for left knee; ice for R knee    Effect of Pain on Daily Activities I can't do what I want to do              [facility] PT Assessment - 07/10/20 0001      Assessment   Medical Diagnosis s/p R TKR     Referring Provider (PT) Frederik Pear     Onset Date/Surgical Date 06/28/20  Next MD Visit Dr. Mayer Camel January 6th     Prior Therapy none       Precautions   Precaution Comments WBAT       Restrictions   Weight Bearing Restrictions No      Balance Screen   Has the patient fallen in the past 6 months No    Has the patient had a decrease in activity level because of a fear of falling?  No    Is the patient reluctant to leave their home because of a fear of falling?  No      Home Environment   Living Environment Private residence      Prior Function   Level of Independence Independent with basic ADLs;Independent with household mobility with device    Leisure gardening       Observation/Other Assessments   Focus on Therapeutic Outcomes (FOTO)  59%       AROM   Right Knee Extension 8    Right Knee Flexion 99      PROM   Right Knee Flexion 102      Strength   Right Hip Flexion 3/5    Right Hip Extension 3-/5    Right Hip ABduction 4/5    Left Hip Flexion 3/5    Left Hip Extension 2/5    Left Hip ABduction 2+/5    Right Knee Flexion 4-/5   within available ROM    Right Knee  Extension 4/5   within available ROM    Left Knee Flexion 3+/5   unable to tolerate aggressive MMT    Left Knee Extension 3+/5   unable to tolerate aggressive MMT- pain    Right Ankle Dorsiflexion 5/5    Left Ankle Dorsiflexion 5/5      Palpation   Palpation comment trigger points noted R quad, hip flexor, hip adductors TTP       Transfers   Five time sit to stand comments  deferred- L knee pain limiting test      Ambulation/Gait   Gait Comments 383ft in 3MWT with RW                       Objective measurements completed on examination: See above findings.                 PT Short Term Goals - 07/10/20 1259      PT SHORT TERM GOAL #1   Title Patient will be compliant to HEP updates provided    Time 1    Period Weeks    Status New    Target Date 07/17/20             PT Long Term Goals - 07/10/20 1259      PT LONG TERM GOAL #1   Title Patient will demonstrate no more than 3 degrees extension and at least 110 degrees flexion R knee    Time 4    Period Weeks    Status New    Target Date 08/07/20      PT LONG TERM GOAL #2   Title Patient will demonstrate an improvement of at least 1 MMT group in order to show improved strength    Time 4    Period Weeks    Status New      PT LONG TERM GOAL #3   Title Patient will be able to correctly sequence gait with [facility] for unlimited distances with no increase in pain  Time 4    Period Weeks    Status New      PT LONG TERM GOAL #4   Title Patient will be able to navigate at least 4 steps with U rail without increase in pain    Time 4    Period Weeks    Status New      PT LONG TERM GOAL #5   Title Patient will report increased tolerance to functional tasks related to gardening in order to improve QOL    Time 4    Period Weeks    Status New                  Plan - 07/10/20 1255    Clinical Impression Statement Arrives 12 days after R TKR surgery, and reports R knee is doing well and  most of her limitations seem to be completely in the left side due to severe pain. Per exam, demonstrates typical presentation following TKR surgery including limited ROM, limited functional strength, gait impairment, reduced mobility, reduced safety awareness, and surgical pain and edema. Anticipate she will do well with recovery of R knee, however will need to closely monitor L knee to prevent exacerbation of pain on this side.    Personal Factors and Comorbidities Age;Fitness;Comorbidity 3+;Time since onset of injury/illness/exacerbation    Comorbidities CAD, OA, obesity    Examination-Activity Limitations Bed Mobility;Locomotion Level;Transfers;Carry;Sit;Squat;Dressing;Stairs;Stand    Examination-Participation Restrictions Church;Cleaning;Community Activity;Driving;Shop    Stability/Clinical Decision Making Stable/Uncomplicated    Clinical Decision Making Low    Rehab Potential Good    PT Frequency 3x / week    PT Duration 4 weeks    PT Treatment/Interventions ADLs/Self Care Home Management;Cryotherapy;DME Instruction;Gait training;Stair training;Functional mobility training;Therapeutic activities;Therapeutic exercise;Balance training;Neuromuscular re-education;Patient/family education;Manual techniques;Compression bandaging;Scar mobilization;Passive range of motion;Dry needling;Energy conservation;Taping;Vasopneumatic Device;Joint Manipulations    PT Next Visit Plan work on ROM, strengthening as tolerated; manual techniques for edema/ROM/pain. Gait and stair training.    PT Home Exercise Plan continue with established HHPT program (quad sets, heel slides in supine and sitting, lunges, hip abduction, ankle pumps)    Consulted and Agree with Plan of Care Patient           Patient will benefit from skilled therapeutic intervention in order to improve the following deficits and impairments:  Abnormal gait, Decreased range of motion, Difficulty walking, Increased fascial restricitons, Decreased  safety awareness, Increased muscle spasms, Obesity, Decreased activity tolerance, Pain, Decreased balance, Decreased knowledge of use of DME, Decreased scar mobility, Hypomobility, Impaired flexibility, Decreased mobility, Decreased strength, Increased edema  Visit Diagnosis: Acute pain of right knee - Plan: PT plan of care cert/re-cert  Stiffness of right knee, not elsewhere classified - Plan: PT plan of care cert/re-cert  Muscle weakness (generalized) - Plan: PT plan of care cert/re-cert  Other abnormalities of gait and mobility - Plan: PT plan of care cert/re-cert     Problem List Patient Active Problem List   Diagnosis Date Noted   Preop general physical exam 06/12/2020   Bilateral primary osteoarthritis of knee 11/26/2016   CAD S/P percutaneous coronary angioplasty 09/29/2016   Unstable angina ([place]) 02/27/2016   IFG (impaired fasting glucose) 11/20/2015   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 03/17/2012   Regurgitation 12/10/2011   Obese 10/14/2011   Barrett's esophagus 01/13/2011   Tobacco use disorder 01/02/2011   COLONIC POLYPS 10/10/2010   ULCER OF ESOPHAGUS WITHOUT BLEEDING 10/10/2010   GERD 08/15/2010   DIAPHRAGMAT HERN W/O MENTION OBSTRUCTION/GANGREN 08/12/2010  HERPES ZOSTER WITHOUT MENTION OF COMPLICATION 18/33/5825    Ann Lions PT, DPT, PN1   Supplemental Physical Therapist [facility]    Pager [phone] Acute Rehab Office [facility] [place], [place], [address] Phone: [phone]   Fax:  [phone]  Name: Rachel Carr MRN: [id number] Date of Birth: [birth year]

## 2020-07-12 ENCOUNTER — Other Ambulatory Visit: Payer: Self-pay

## 2020-07-12 ENCOUNTER — Ambulatory Visit (INDEPENDENT_AMBULATORY_CARE_PROVIDER_SITE_OTHER): Payer: Medicare HMO | Admitting: Rehabilitative and Restorative Service Providers"

## 2020-07-12 DIAGNOSIS — M25561 Pain in right knee: Secondary | ICD-10-CM | POA: Diagnosis not present

## 2020-07-12 DIAGNOSIS — R2689 Other abnormalities of gait and mobility: Secondary | ICD-10-CM | POA: Diagnosis not present

## 2020-07-12 DIAGNOSIS — M25661 Stiffness of right knee, not elsewhere classified: Secondary | ICD-10-CM

## 2020-07-12 DIAGNOSIS — M6281 Muscle weakness (generalized): Secondary | ICD-10-CM | POA: Diagnosis not present

## 2020-07-12 NOTE — Therapy (Signed)
[place], [place], [address] Phone: [phone]   Fax:  [phone]  Physical Therapy Treatment  Patient Details  Name: Rachel Carr MRN: [id number] Date of Birth: 18-Oct-1950 Referring Provider (PT): Frederik Pear    Encounter Date: 07/12/2020   PT End of Session - 07/12/20 1412    Visit Number 2    Number of Visits 12    Date for PT Re-Evaluation 08/07/20    Authorization Type [employer] Medicare    Authorization Time Period 07/10/20 to 08/07/20    Authorization - Visit Number 2    Authorization - Number of Visits 12    Progress Note Due on Visit 10    PT Start Time 7035    PT Stop Time 1445    PT Time Calculation (min) 42 min    Activity Tolerance Patient tolerated treatment well    Behavior During Therapy [facility] for tasks assessed/performed           Past Medical History:  Diagnosis Date  . Barrett's syndrome   . CAD (coronary artery disease)   . Esophageal stricture   . Esophageal ulcer 09-2010   gastritis Dr. Glennon Hamilton  . Gastric polyps    colonoscopy 2012(due again for repeat 2017)  . Shingles    on the right eye    Past Surgical History:  Procedure Laterality Date  . CARPAL TUNNEL RELEASE    . CHOLECYSTECTOMY    . COMPLETE MASTECTOMY W/ SENTINEL NODE BIOPSY    . NISSEN FUNDOPLICATION  0093  . RADIOFREQUENCY ABLATION    . RT knee surgery    . TOTAL ABDOMINAL HYSTERECTOMY W/ BILATERAL SALPINGOOPHORECTOMY     endometriosis    There were no vitals filed for this visit.   Subjective Assessment - 07/12/20 1409    Subjective The patient reports "if it wasn't for the pain in my left leg, I could do better with my right."  She has arthritis in the L knee that limits mobility and is due to have L knee in June 2022.    How long can you sit comfortably? 60 minutes    How long can you stand comfortably? limited by L knee, 20 minutes max    How long can you walk comfortably? household  distances, 15 minutes a time    Patient Stated Goals no more pain so I can do what I want to (gardening primarily)    Currently in Pain? Yes    Pain Score 2    thigh and shin of R: L knee is sigificantly painful   Pain Location Knee    Pain Orientation Right    Pain Descriptors / Indicators Tightness    Pain Type Surgical pain    Pain Radiating Towards her L knee hurts more than R surgical knee (due to advanced arthritis).    Pain Onset 1 to 4 weeks ago    Pain Frequency Intermittent    Aggravating Factors  R knee is dull ache    Pain Relieving Factors nothing for left knee-- hurts constantly              [facility] PT Assessment - 07/12/20 1413      Assessment   Medical Diagnosis s/p R TKR     Referring Provider (PT) Frederik Pear     Onset Date/Surgical Date 06/28/20    Next MD Visit Dr. Mayer Camel January 6th  [facility] Adult PT Treatment/Exercise - 07/12/20 1422      Ambulation/Gait   Ambulation/Gait Yes    Ambulation/Gait Assistance 6: Modified independent (Device/Increase time)    Ambulation Distance (Feet) 150 Feet    Assistive device Rolling walker    Gait Pattern --   antalgic left   Ambulation Surface Level;Indoor      Exercises   Exercises Knee/Hip      Knee/Hip Exercises: Stretches   Passive Hamstring Stretch Right;1 rep;30 seconds    Quad Stretch Right;30 seconds;3 reps    Sports administrator Limitations prone with some pain      Knee/Hip Exercises: Standing   Heel Raises Right;Left;1 set;15 reps      Knee/Hip Exercises: Supine   Quad Sets Strengthening;Right;10 reps    Straight Leg Raises Strengthening;Right;10 reps      Knee/Hip Exercises: Prone   Hamstring Curl 10 reps    Contract/Relax to Increase Flexion passive knee flexion, then contract/relax with passive overpressure      Modalities   Modalities Vasopneumatic      Vasopneumatic   Number Minutes Vasopneumatic  10 minutes    Vasopnuematic Location  Knee     Vasopneumatic Pressure Low    Vasopneumatic Temperature  34      Manual Therapy   Manual Therapy Soft tissue mobilization;Passive ROM    Manual therapy comments to reduce myofascial tightness    Soft tissue mobilization hamstring STM and quad STM after isometrics and stretching    Passive ROM R knee                     PT Short Term Goals - 07/10/20 1259      PT SHORT TERM GOAL #1   Title Patient will be compliant to HEP updates provided    Time 1    Period Weeks    Status New    Target Date 07/17/20             PT Long Term Goals - 07/10/20 1259      PT LONG TERM GOAL #1   Title Patient will demonstrate no more than 3 degrees extension and at least 110 degrees flexion R knee    Time 4    Period Weeks    Status New    Target Date 08/07/20      PT LONG TERM GOAL #2   Title Patient will demonstrate an improvement of at least 1 MMT group in order to show improved strength    Time 4    Period Weeks    Status New      PT LONG TERM GOAL #3   Title Patient will be able to correctly sequence gait with SPC for unlimited distances with no increase in pain    Time 4    Period Weeks    Status New      PT LONG TERM GOAL #4   Title Patient will be able to navigate at least 4 steps with U rail without increase in pain    Time 4    Period Weeks    Status New      PT LONG TERM GOAL #5   Title Patient will report increased tolerance to functional tasks related to gardening in order to improve QOL    Time 4    Period Weeks    Status New                 Plan - 07/12/20 1440  Clinical Impression Statement The patient has increased L knee pain.  She tolerates ther ex well today with passive overpressure working on knee flexion.  Per quad set, she appears to be gaining extension ROM.  Will continue to progress to tolerance.    Personal Factors and Comorbidities Age;Fitness;Comorbidity 3+;Time since onset of injury/illness/exacerbation    Comorbidities CAD,  OA, obesity    Examination-Activity Limitations Bed Mobility;Locomotion Level;Transfers;Carry;Sit;Squat;Dressing;Stairs;Stand    Examination-Participation Restrictions Church;Cleaning;Community Activity;Driving;Shop    Stability/Clinical Decision Making --    Rehab Potential --    PT Frequency --    PT Duration --    PT Treatment/Interventions ADLs/Self Care Home Management;Cryotherapy;DME Instruction;Gait training;Stair training;Functional mobility training;Therapeutic activities;Therapeutic exercise;Balance training;Neuromuscular re-education;Patient/family education;Manual techniques;Compression bandaging;Scar mobilization;Passive range of motion;Dry needling;Energy conservation;Taping;Vasopneumatic Device;Joint Manipulations    PT Next Visit Plan work on ROM, strengthening as tolerated; manual techniques for edema/ROM/pain. Gait and stair training.    PT Home Exercise Plan continue with established HHPT program (quad sets, heel slides in supine and sitting, lunges, hip abduction, ankle pumps)    Consulted and Agree with Plan of Care Patient           Patient will benefit from skilled therapeutic intervention in order to improve the following deficits and impairments:  Abnormal gait, Decreased range of motion, Difficulty walking, Increased fascial restricitons, Decreased safety awareness, Increased muscle spasms, Obesity, Decreased activity tolerance, Pain, Decreased balance, Decreased knowledge of use of DME, Decreased scar mobility, Hypomobility, Impaired flexibility, Decreased mobility, Decreased strength, Increased edema  Visit Diagnosis: Stiffness of right knee, not elsewhere classified  Acute pain of right knee  Muscle weakness (generalized)  Other abnormalities of gait and mobility     Problem List Patient Active Problem List   Diagnosis Date Noted  . Preop general physical exam 06/12/2020  . Bilateral primary osteoarthritis of knee 11/26/2016  . CAD S/P percutaneous  coronary angioplasty 09/29/2016  . Unstable angina ([place] [place]) 02/27/2016  . IFG (impaired fasting glucose) 11/20/2015  . 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 Regurgitation 12/10/2011  . Obese 10/14/2011  . Barrett's esophagus 01/13/2011  . Tobacco use disorder 01/02/2011  . COLONIC POLYPS 10/10/2010  . ULCER OF ESOPHAGUS WITHOUT BLEEDING 10/10/2010  . GERD 08/15/2010  . 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 HERPES ZOSTER WITHOUT MENTION OF COMPLICATION 83/72/9021    [place] , PT 07/12/2020, 2:41 PM  [facility] [place], [place], [address] Phone: [phone]   Fax:  [phone]  Name: Rachel Carr MRN: [id number] Date of Birth: 04-06-1951

## 2020-07-14 ENCOUNTER — Other Ambulatory Visit: Payer: Self-pay | Admitting: Family Medicine

## 2020-07-17 ENCOUNTER — Ambulatory Visit (INDEPENDENT_AMBULATORY_CARE_PROVIDER_SITE_OTHER): Payer: Medicare HMO | Admitting: Physical Therapy

## 2020-07-17 ENCOUNTER — Other Ambulatory Visit: Payer: Self-pay

## 2020-07-17 ENCOUNTER — Encounter: Payer: Self-pay | Admitting: Physical Therapy

## 2020-07-17 DIAGNOSIS — M25561 Pain in right knee: Secondary | ICD-10-CM | POA: Diagnosis not present

## 2020-07-17 DIAGNOSIS — M25661 Stiffness of right knee, not elsewhere classified: Secondary | ICD-10-CM

## 2020-07-17 DIAGNOSIS — M6281 Muscle weakness (generalized): Secondary | ICD-10-CM | POA: Diagnosis not present

## 2020-07-17 DIAGNOSIS — R2689 Other abnormalities of gait and mobility: Secondary | ICD-10-CM

## 2020-07-17 NOTE — Therapy (Signed)
[place]achel [place], [place], [address] Phone: [phone]   Fax:  [phone]  Physical Therapy Treatment  Patient Details  Name: Rachel Carr MRN: [id number] Date of Birth: 1950/10/29 Referring Provider (PT): Frederik Pear    Encounter Date: 07/17/2020   PT End of Session - 07/17/20 1654    Visit Number 3    Number of Visits 12    Date for PT Re-Evaluation 08/07/20    Authorization Type [employer]    Authorization Time Period 07/10/20 to 08/07/20    Authorization - Visit Number 3    Authorization - Number of Visits 12    Progress Note Due on Visit 10    PT Start Time 8182    PT Stop Time 1515    PT Time Calculation (min) 41 min    Activity Tolerance Patient tolerated treatment well    Behavior During Therapy [facility] for tasks assessed/performed           Past Medical History:  Diagnosis Date  . Barrett's syndrome   . CAD (coronary artery disease)   . Esophageal stricture   . Esophageal ulcer 09-2010   gastritis Dr. Glennon Hamilton  . Gastric polyps    colonoscopy 2012(due again for repeat 2017)  . Shingles    on the right eye    Past Surgical History:  Procedure Laterality Date  . CARPAL TUNNEL RELEASE    . CHOLECYSTECTOMY    . COMPLETE MASTECTOMY W/ SENTINEL NODE BIOPSY    . NISSEN FUNDOPLICATION  9937  . RADIOFREQUENCY ABLATION    . RT knee surgery    . TOTAL ABDOMINAL HYSTERECTOMY W/ BILATERAL SALPINGOOPHORECTOMY     endometriosis    There were no vitals filed for this visit.   Subjective Assessment - 07/17/20 1437    Subjective I didn't sleep well last night, I kept getting zapped by my knee stinging. My left knee hurts more than my right still. I tried some CBD oil from my grand daughter, I didn't eat it but rubbed my knee with it.    Patient Stated Goals no more pain so I can do what I want to (gardening primarily)    Currently in Pain? Yes    Pain Score 4    4/10 in the right, 9/10  in the left   Pain Location Knee    Pain Orientation Right;Left    Pain Descriptors / Indicators Other (Comment)   electrical pulses   Pain Type Surgical pain              [facility] PT Assessment - 07/17/20 0001      AROM   Right Knee Extension 1   after manual    Right Knee Flexion 105   to 111 with PT overpressure                         [facility] Adult PT Treatment/Exercise - 07/17/20 0001      Ambulation/Gait   Gait Comments 168ft with MinA for balance, SPC and Mod cues for sequencing       Knee/Hip Exercises: Supine   Quad Sets AROM;Right;1 set;15 reps    [employer] Limitations 3 second holds     Short Arc [employer] Strengthening;Right;1 set;15 reps    Straight Leg Raises Strengthening;Right;1 set;15 reps      Manual Therapy   Manual Therapy Edema management;Joint mobilization;Soft tissue mobilization    Manual therapy comments  separate from all other skilled services     Edema Management edema massage     Joint Mobilization patellar mobs all directions     Soft tissue mobilization quad STM with rolling pin    Passive ROM R knee flexion and extension                   PT Education - 07/17/20 1654    Education Details exercise form and purpose, mechanics of gait training, check with MD before applying products containing CBD oil    Person(s) Educated Patient    Methods Explanation    Comprehension Verbalized understanding;Need further instruction            PT Short Term Goals - 07/10/20 1259      PT SHORT TERM GOAL #1   Title Patient will be compliant to HEP updates provided    Time 1    Period Weeks    Status New    Target Date 07/17/20             PT Long Term Goals - 07/10/20 1259      PT LONG TERM GOAL #1   Title Patient will demonstrate no more than 3 degrees extension and at least 110 degrees flexion R knee    Time 4    Period Weeks    Status New    Target Date 08/07/20      PT LONG TERM GOAL #2   Title Patient will  demonstrate an improvement of at least 1 MMT group in order to show improved strength    Time 4    Period Weeks    Status New      PT LONG TERM GOAL #3   Title Patient will be able to correctly sequence gait with SPC for unlimited distances with no increase in pain    Time 4    Period Weeks    Status New      PT LONG TERM GOAL #4   Title Patient will be able to navigate at least 4 steps with U rail without increase in pain    Time 4    Period Weeks    Status New      PT LONG TERM GOAL #5   Title Patient will report increased tolerance to functional tasks related to gardening in order to improve QOL    Time 4    Period Weeks    Status New                 Plan - 07/17/20 1655    Clinical Impression Statement Ms. Henriquez arrives in good spirits but reports that she did not sleep well last night due to what seemed to be nerve pain. Spent quite a bit of time working on manual interventions including patellar mobs, overpressure into extension and flexion, edema control, and STM to quads. Able to reach ROM of 1-111 degrees today with overpressure but painful. Otherwise continued efforts at quad strengthening and proprioception of surgical LE. Seems to be progressing well but does continue ongoing reminders for safe use of assistive device. Introduced Personnel officer with [facility] however had quite a bit of difficulty with this- continue to recommend she use RW for now.    Personal Factors and Comorbidities Age;Fitness;Comorbidity 3+;Time since onset of injury/illness/exacerbation    Comorbidities CAD, OA, obesity    Examination-Activity Limitations Bed Mobility;Locomotion Level;Transfers;Carry;Sit;Squat;Dressing;Stairs;Stand    Examination-Participation Restrictions Church;Cleaning;Community Activity;Driving;Shop    Stability/Clinical Decision Making Stable/Uncomplicated  Clinical Decision Making Low    Rehab Potential Good    PT Frequency 3x / week    PT Duration 4 weeks    PT  Treatment/Interventions ADLs/Self Care Home Management;Cryotherapy;DME Instruction;Gait training;Stair training;Functional mobility training;Therapeutic activities;Therapeutic exercise;Balance training;Neuromuscular re-education;Patient/family education;Manual techniques;Compression bandaging;Scar mobilization;Passive range of motion;Dry needling;Energy conservation;Taping;Vasopneumatic Device;Joint Manipulations    PT Next Visit Plan work on ROM, strengthening as tolerated; manual techniques for edema/ROM/pain. Gait and stair training.    PT Home Exercise Plan continue with established HHPT program (quad sets, heel slides in supine and sitting, lunges, hip abduction, ankle pumps)           Patient will benefit from skilled therapeutic intervention in order to improve the following deficits and impairments:  Abnormal gait, Decreased range of motion, Difficulty walking, Increased fascial restricitons, Decreased safety awareness, Increased muscle spasms, Obesity, Decreased activity tolerance, Pain, Decreased balance, Decreased knowledge of use of DME, Decreased scar mobility, Hypomobility, Impaired flexibility, Decreased mobility, Decreased strength, Increased edema  Visit Diagnosis: Stiffness of right knee, not elsewhere classified  Acute pain of right knee  Muscle weakness (generalized)  Other abnormalities of gait and mobility     Problem List Patient Active Problem List   Diagnosis Date Noted  . Preop general physical exam 06/12/2020  . Bilateral primary osteoarthritis of knee 11/26/2016  . CAD S/P percutaneous coronary angioplasty 09/29/2016  . Unstable angina ([place]) 02/27/2016  . IFG (impaired fasting glucose) 11/20/2015  . 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 Regurgitation 12/10/2011  . Obese 10/14/2011  . Barrett's esophagus 01/13/2011  . Tobacco use disorder 01/02/2011  . COLONIC POLYPS 10/10/2010  . ULCER OF ESOPHAGUS WITHOUT  BLEEDING 10/10/2010  . GERD 08/15/2010  . 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

## 2020-07-19 ENCOUNTER — Ambulatory Visit (INDEPENDENT_AMBULATORY_CARE_PROVIDER_SITE_OTHER): Payer: Medicare HMO | Admitting: Physical Therapy

## 2020-07-19 ENCOUNTER — Encounter: Payer: Self-pay | Admitting: Physical Therapy

## 2020-07-19 ENCOUNTER — Other Ambulatory Visit: Payer: Self-pay

## 2020-07-19 DIAGNOSIS — M25661 Stiffness of right knee, not elsewhere classified: Secondary | ICD-10-CM | POA: Diagnosis not present

## 2020-07-19 DIAGNOSIS — M25561 Pain in right knee: Secondary | ICD-10-CM | POA: Diagnosis not present

## 2020-07-19 DIAGNOSIS — M6281 Muscle weakness (generalized): Secondary | ICD-10-CM

## 2020-07-19 DIAGNOSIS — R2689 Other abnormalities of gait and mobility: Secondary | ICD-10-CM

## 2020-07-19 NOTE — Therapy (Signed)
[place], [place], [address] Phone: [phone]   Fax:  [phone]  Physical Therapy Treatment  Patient Details  Name: Rachel Carr MRN: [id number] Date of Birth: [birth year] Referring Provider (PT): Frederik Pear    Encounter Date: 07/19/2020   PT End of Session - 07/19/20 1607    Visit Number 4    Number of Visits 12    Date for PT Re-Evaluation 08/07/20    Authorization Type [employer] Medicare    Authorization Time Period 07/10/20 to 08/07/20    Authorization - Visit Number 4    Authorization - Number of Visits 12    Progress Note Due on Visit 10    PT Start Time 8676    PT Stop Time 1443    PT Time Calculation (min) 38 min    Activity Tolerance Patient tolerated treatment well    Behavior During Therapy [facility] for tasks assessed/performed           Past Medical History:  Diagnosis Date  . Barrett's syndrome   . CAD (coronary artery disease)   . Esophageal stricture   . Esophageal ulcer 09-2010   gastritis Dr. Glennon Hamilton  . Gastric polyps    colonoscopy 2012(due again for repeat 2017)  . Shingles    on the right eye    Past Surgical History:  Procedure Laterality Date  . CARPAL TUNNEL RELEASE    . CHOLECYSTECTOMY    . COMPLETE MASTECTOMY W/ SENTINEL NODE BIOPSY    . NISSEN FUNDOPLICATION  1950  . RADIOFREQUENCY ABLATION    . RT knee surgery    . TOTAL ABDOMINAL HYSTERECTOMY W/ BILATERAL SALPINGOOPHORECTOMY     endometriosis    There were no vitals filed for this visit.   Subjective Assessment - 07/19/20 1408    Subjective I felt good after last session, I've gotten a little more sleep using some melatonin. The weather makes it worse.    Currently in Pain? Yes    Pain Score 3    on the right; left 9/10 still   Pain Location Knee    Pain Orientation Right;Left    Pain Descriptors / Indicators Other (Comment)   still having electrical pulses on the right, "zings"   Pain Type  Surgical pain              [facility] PT Assessment - 07/19/20 0001      AROM   Right Knee Flexion 105   110 with overpressure                        [facility] Adult PT Treatment/Exercise - 07/19/20 0001      Knee/Hip Exercises: Stretches   Active Hamstring Stretch Right;3 reps;30 seconds      Knee/Hip Exercises: Standing   Terminal Knee Extension Right;1 set;15 reps    Theraband Level (Terminal Knee Extension) Level 2 (Red)    Other Standing Knee Exercises standing knee flexion stretch x5 with 10 second holds      Manual Therapy   Manual Therapy Edema management;Joint mobilization;Soft tissue mobilization    Manual therapy comments separate from all other skilled services     Edema Management edema massage     Joint Mobilization patellar mobs all directions     Soft tissue mobilization quad STM with rolling pin    Passive ROM R knee flexion and extension  PT Education - 07/19/20 1606    Education Details exercise form and purpose, general progression after TKR surgeries and expected ROM/functional gains; should be functional after DC from PT but takes about a year to fully recover from surgery; continue to use RW for safety/offloading painful knees    Person(s) Educated Patient    Methods Explanation    Comprehension Verbalized understanding;Need further instruction            PT Short Term Goals - 07/10/20 1259      PT SHORT TERM GOAL #1   Title Patient will be compliant to HEP updates provided    Time 1    Period Weeks    Status New    Target Date 07/17/20             PT Long Term Goals - 07/10/20 1259      PT LONG TERM GOAL #1   Title Patient will demonstrate no more than 3 degrees extension and at least 110 degrees flexion R knee    Time 4    Period Weeks    Status New    Target Date 08/07/20      PT LONG TERM GOAL #2   Title Patient will demonstrate an improvement of at least 1 MMT group in order to show improved  strength    Time 4    Period Weeks    Status New      PT LONG TERM GOAL #3   Title Patient will be able to correctly sequence gait with SPC for unlimited distances with no increase in pain    Time 4    Period Weeks    Status New      PT LONG TERM GOAL #4   Title Patient will be able to navigate at least 4 steps with U rail without increase in pain    Time 4    Period Weeks    Status New      PT LONG TERM GOAL #5   Title Patient will report increased tolerance to functional tasks related to gardening in order to improve QOL    Time 4    Period Weeks    Status New                 Plan - 07/19/20 1608    Clinical Impression Statement [name] arrives today feeling well- R TKR is progressing and feeling well, the left knee is really still more problematic. Continued manual techniques for ROM and edema control R LE, also introduced closed chain activities as tolerated given L knee pain today. Progressing well and as expected following TKR surgery.    Personal Factors and Comorbidities Age;Fitness;Comorbidity 3+;Time since onset of injury/illness/exacerbation    Comorbidities CAD, OA, obesity    Examination-Activity Limitations Bed Mobility;Locomotion Level;Transfers;Carry;Sit;Squat;Dressing;Stairs;Stand    Examination-Participation Restrictions Church;Cleaning;Community Activity;Driving;Shop    Stability/Clinical Decision Making Stable/Uncomplicated    Clinical Decision Making Low    Rehab Potential Good    PT Frequency 3x / week    PT Duration 4 weeks    PT Treatment/Interventions ADLs/Self Care Home Management;Cryotherapy;DME Instruction;Gait training;Stair training;Functional mobility training;Therapeutic activities;Therapeutic exercise;Balance training;Neuromuscular re-education;Patient/family education;Manual techniques;Compression bandaging;Scar mobilization;Passive range of motion;Dry needling;Energy conservation;Taping;Vasopneumatic Device;Joint Manipulations    PT  Next Visit Plan work on ROM, strengthening as tolerated; manual techniques for edema/ROM/pain. Gait and stair training. Closed chain activities as tolerated.    PT Home Exercise Plan continue with established HHPT program (quad sets, heel slides in supine and  sitting, lunges, hip abduction, ankle pumps)    Consulted and Agree with Plan of Care Patient           Patient will benefit from skilled therapeutic intervention in order to improve the following deficits and impairments:  Abnormal gait,Decreased range of motion,Difficulty walking,Increased fascial restricitons,Decreased safety awareness,Increased muscle spasms,Obesity,Decreased activity tolerance,Pain,Decreased balance,Decreased knowledge of use of DME,Decreased scar mobility,Hypomobility,Impaired flexibility,Decreased mobility,Decreased strength,Increased edema  Visit Diagnosis: Stiffness of right knee, not elsewhere classified  Acute pain of right knee  Muscle weakness (generalized)  Other abnormalities of gait and mobility     Problem List Patient Active Problem List   Diagnosis Date Noted  . Preop general physical exam 06/12/2020  . Bilateral primary osteoarthritis of knee 11/26/2016  . CAD S/P percutaneous coronary angioplasty 09/29/2016  . Unstable angina ([place]) 02/27/2016  . IFG (impaired fasting glucose) 11/20/2015  . 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 Regurgitation 12/10/2011  . Obese 10/14/2011  . Barrett's esophagus 01/13/2011  . Tobacco use disorder 01/02/2011  . COLONIC POLYPS 10/10/2010  . ULCER OF ESOPHAGUS WITHOUT BLEEDING 10/10/2010  . GERD 08/15/2010  . 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

## 2020-07-22 ENCOUNTER — Ambulatory Visit (INDEPENDENT_AMBULATORY_CARE_PROVIDER_SITE_OTHER): Payer: Medicare HMO | Admitting: Physical Therapy

## 2020-07-22 DIAGNOSIS — R2689 Other abnormalities of gait and mobility: Secondary | ICD-10-CM

## 2020-07-22 DIAGNOSIS — M25661 Stiffness of right knee, not elsewhere classified: Secondary | ICD-10-CM

## 2020-07-22 DIAGNOSIS — M6281 Muscle weakness (generalized): Secondary | ICD-10-CM

## 2020-07-22 DIAGNOSIS — M25561 Pain in right knee: Secondary | ICD-10-CM | POA: Diagnosis not present

## 2020-07-22 NOTE — Therapy (Signed)
[place], [place], [address] Phone: [phone]   Fax:  [phone]  Physical Therapy Treatment  Patient Details  Name: Rachel Carr MRN: [id number] Date of Birth: [birth year] Referring Provider (PT): Frederik Pear    Encounter Date: 07/22/2020   PT End of Session - 07/22/20 1356    Visit Number 5    Number of Visits 12    Date for PT Re-Evaluation 08/07/20    Authorization Type [employer] Medicare    Authorization Time Period 07/10/20 to 08/07/20    Authorization - Visit Number 5    Authorization - Number of Visits 12    Progress Note Due on Visit 10    PT Start Time 1350    PT Stop Time 1434    PT Time Calculation (min) 44 min    Activity Tolerance Patient tolerated treatment well    Behavior During Therapy [facility] for tasks assessed/performed           Past Medical History:  Diagnosis Date  . Barrett's syndrome   . CAD (coronary artery disease)   . Esophageal stricture   . Esophageal ulcer 09-2010   gastritis Dr. Glennon Hamilton  . Gastric polyps    colonoscopy 2012(due again for repeat 2017)  . Shingles    on the right eye    Past Surgical History:  Procedure Laterality Date  . CARPAL TUNNEL RELEASE    . CHOLECYSTECTOMY    . COMPLETE MASTECTOMY W/ SENTINEL NODE BIOPSY    . NISSEN FUNDOPLICATION  7564  . RADIOFREQUENCY ABLATION    . RT knee surgery    . TOTAL ABDOMINAL HYSTERECTOMY W/ BILATERAL SALPINGOOPHORECTOMY     endometriosis    There were no vitals filed for this visit.   Subjective Assessment - 07/22/20 1353    Subjective Pt reports her Rt knee is feeling less swollen.  She is waking at 2am from Rt knee pain; moves around and ices and falls back to sleep around 5am.  Her Lt knee has been bothering her more each day (up to 8-9/10); wears knee sleeve to help with pain and support.    Currently in Pain? Yes    Pain Score 3     Pain Location Knee    Pain Orientation Right     Aggravating Factors  ?    Pain Relieving Factors ?              [facility] PT Assessment - 07/22/20 0001      Assessment   Medical Diagnosis s/p R TKR     Referring Provider (PT) Frederik Pear     Onset Date/Surgical Date 06/28/20    Next MD Visit Dr. Mayer Camel January 6th     Prior Therapy none       AROM   Right Knee Extension 1    Right Knee Flexion 105      PROM   Right Knee Extension 0    Right Knee Flexion 110   AAROM, heel slide           [facility] Adult PT Treatment/Exercise - 07/22/20 0001      Knee/Hip Exercises: Stretches   Passive Hamstring Stretch Right;2 reps;20 seconds   long sitting with overpressure from pt.   Quad Stretch Right;20 seconds;3 reps   prone with strap   Gastroc Stretch --   verbally discussed. added to HEP     Knee/Hip Exercises: Aerobic   Nustep L4: 6 min for ROM  Knee/Hip Exercises: Seated   Long Arc Quad Right;1 set;5 reps    Other Seated Knee/Hip Exercises seated scoots x 10 sec hold x 5 reps    Hamstring Curl Right;1 set;10 reps   red band   Sit to Sand 1 set;10 reps;without UE support   cues to shift weight to RLE     Knee/Hip Exercises: Supine   Heel Slides AAROM;Right;1 set;5 reps   strap assist; 10 sec hold   Bridges 1 set;10 reps    Straight Leg Raises Strengthening;Right;1 set;10 reps      Knee/Hip Exercises: Sidelying   Hip ABduction Strengthening;Right;1 set;10 reps      Knee/Hip Exercises: Prone   Hip Extension Strengthening;Right;1 set;5 reps   very difficult   Other Prone Exercises Rt TKE x 5 sec x 10 reps      Modalities   Modalities --   deferred; will ice at home                 PT Education - 07/22/20 1434    Education Details HEP - will consolidate HH exercises next visit.    Person(s) Educated Patient    Methods Explanation;Handout;Demonstration    Comprehension Verbalized understanding;Returned demonstration            PT Short Term Goals - 07/10/20 1259      PT SHORT TERM GOAL #1   Title  Patient will be compliant to HEP updates provided    Time 1    Period Weeks    Status New    Target Date 07/17/20             PT Long Term Goals - 07/22/20 1645      PT LONG TERM GOAL #1   Title Patient will demonstrate no more than 3 degrees extension and at least 110 degrees flexion R knee    Time 4    Period Weeks    Status Achieved      PT LONG TERM GOAL #2   Title Patient will demonstrate an improvement of at least 1 MMT group in order to show improved strength    Time 4    Period Weeks    Status On-going      PT LONG TERM GOAL #3   Title Patient will be able to correctly sequence gait with SPC for unlimited distances with no increase in pain    Time 4    Period Weeks    Status On-going      PT LONG TERM GOAL #4   Title Patient will be able to navigate at least 4 steps with U rail without increase in pain    Time 4    Period Weeks    Status On-going      PT LONG TERM GOAL #5   Title Patient will report increased tolerance to functional tasks related to gardening in order to improve QOL    Time 4    Period Weeks    Status On-going                 Plan - 07/22/20 1434    Clinical Impression Statement Pt demonstrated improved Rt knee flexion ROM.  Pt reported some increase in Lt knee pain during exercises; encouraged pt to bear more weight into RLE to offload weight from LLE.  Pt making good progress towards all therapy goals. Began to create HEP and will modify once she brings HHPT exercise worksheets in (consolidate and advance).  Personal Factors and Comorbidities Age;Fitness;Comorbidity 3+;Time since onset of injury/illness/exacerbation    Comorbidities CAD, OA, obesity    Examination-Activity Limitations Bed Mobility;Locomotion Level;Transfers;Carry;Sit;Squat;Dressing;Stairs;Stand    Examination-Participation Restrictions Church;Cleaning;Community Activity;Driving;Shop    Stability/Clinical Decision Making Stable/Uncomplicated    Rehab Potential  Good    PT Frequency 3x / week    PT Duration 4 weeks    PT Treatment/Interventions ADLs/Self Care Home Management;Cryotherapy;DME Instruction;Gait training;Stair training;Functional mobility training;Therapeutic activities;Therapeutic exercise;Balance training;Neuromuscular re-education;Patient/family education;Manual techniques;Compression bandaging;Scar mobilization;Passive range of motion;Dry needling;Energy conservation;Taping;Vasopneumatic Device;Joint Manipulations    PT Next Visit Plan work on ROM, strengthening as tolerated; manual techniques for edema/ROM/pain. Gait and stair training. Closed chain activities as tolerated.    PT Home Exercise Plan Access Code: [email or web address]    Consulted and Agree with Plan of Care Patient           Patient will benefit from skilled therapeutic intervention in order to improve the following deficits and impairments:  Abnormal gait,Decreased range of motion,Difficulty walking,Increased fascial restricitons,Decreased safety awareness,Increased muscle spasms,Obesity,Decreased activity tolerance,Pain,Decreased balance,Decreased knowledge of use of DME,Decreased scar mobility,Hypomobility,Impaired flexibility,Decreased mobility,Decreased strength,Increased edema  Visit Diagnosis: Stiffness of right knee, not elsewhere classified  Acute pain of right knee  Muscle weakness (generalized)  Other abnormalities of gait and mobility     Problem List Patient Active Problem List   Diagnosis Date Noted  . Preop general physical exam 06/12/2020  . Bilateral primary osteoarthritis of knee 11/26/2016  . CAD S/P percutaneous coronary angioplasty 09/29/2016  . Unstable angina ([place]) 02/27/2016  . IFG (impaired fasting glucose) 11/20/2015  . 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 Regurgitation 12/10/2011  . Obese 10/14/2011  . Barrett's esophagus 01/13/2011  . Tobacco use disorder 01/02/2011  . COLONIC POLYPS  10/10/2010  . ULCER OF ESOPHAGUS WITHOUT BLEEDING 10/10/2010  . GERD 08/15/2010  . 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

## 2020-07-22 NOTE — Patient Instructions (Signed)
Access Code: [email or web address]: [email or web address]: 12/13/2021Prepared by: [place]  Prone Quadriceps Set - 1 x daily - 7 x weekly - 3 sets - 10 reps  Prone Quadriceps Stretch with Strap - 1 x daily - 7 x weekly - 3 sets - 10 reps  Gastroc Stretch on Wall - 2 x daily - 7 x weekly - 1 sets - 2-3 reps - 20 seconds hold  Seated Hamstring Stretch - 2 x daily - 7 x weekly - 1 sets - 2-3 reps - 20 seconds hold  Sidelying Hip Abduction - 1 x daily - 7 x weekly - 1 sets - 10 reps  Supine Active Straight Leg Raise - 1 x daily - 7 x weekly - 1 sets - 15 reps  Sit to Stand - 1 x daily - 7 x weekly - 1 sets - 5-10 reps

## 2020-07-24 ENCOUNTER — Ambulatory Visit (INDEPENDENT_AMBULATORY_CARE_PROVIDER_SITE_OTHER): Payer: Medicare HMO | Admitting: Physical Therapy

## 2020-07-24 ENCOUNTER — Encounter: Payer: Self-pay | Admitting: Physical Therapy

## 2020-07-24 ENCOUNTER — Other Ambulatory Visit: Payer: Self-pay

## 2020-07-24 DIAGNOSIS — M25661 Stiffness of right knee, not elsewhere classified: Secondary | ICD-10-CM | POA: Diagnosis not present

## 2020-07-24 DIAGNOSIS — M25561 Pain in right knee: Secondary | ICD-10-CM

## 2020-07-24 DIAGNOSIS — R2689 Other abnormalities of gait and mobility: Secondary | ICD-10-CM

## 2020-07-24 DIAGNOSIS — M6281 Muscle weakness (generalized): Secondary | ICD-10-CM | POA: Diagnosis not present

## 2020-07-24 NOTE — Therapy (Signed)
[place], [place], [address] Phone: [phone]   Fax:  [phone]  Physical Therapy Treatment  Patient Details  Name: Rachel Carr MRN: [id number] Date of Birth: [birth year] Referring Provider (PT): Frederik Pear    Encounter Date: 07/24/2020   PT End of Session - 07/24/20 1435    Visit Number 6    Number of Visits 12    Date for PT Re-Evaluation 08/07/20    Authorization Type [employer] Medicare    Authorization Time Period 07/10/20 to 08/07/20    Authorization - Visit Number 6    Authorization - Number of Visits 12    Progress Note Due on Visit 10    PT Start Time 1540    PT Stop Time 1428    PT Time Calculation (min) 39 min    Activity Tolerance Patient tolerated treatment well    Behavior During Therapy [facility] for tasks assessed/performed           Past Medical History:  Diagnosis Date  . Barrett's syndrome   . CAD (coronary artery disease)   . Esophageal stricture   . Esophageal ulcer 09-2010   gastritis Dr. Glennon Hamilton  . Gastric polyps    colonoscopy 2012(due again for repeat 2017)  . Shingles    on the right eye    Past Surgical History:  Procedure Laterality Date  . CARPAL TUNNEL RELEASE    . CHOLECYSTECTOMY    . COMPLETE MASTECTOMY W/ SENTINEL NODE BIOPSY    . NISSEN FUNDOPLICATION  0867  . RADIOFREQUENCY ABLATION    . RT knee surgery    . TOTAL ABDOMINAL HYSTERECTOMY W/ BILATERAL SALPINGOOPHORECTOMY     endometriosis    There were no vitals filed for this visit.   Subjective Assessment - 07/24/20 1351    Subjective I was out sick yesterday, it must have been a 24 hour bug or something I ate because I am fine now but didn't do my exercises. My right leg is feeling fine, the only place it bothers me is my right shin but its just an ache. No falls or close calls. The hardest part for me is still pending it back and lifting my leg.    Currently in Pain? Yes    Pain Score 2      Pain Location Knee    Pain Orientation Right    Pain Descriptors / Indicators Aching;Sore    Pain Type Surgical pain              [facility] PT Assessment - 07/24/20 0001      AROM   Right Knee Extension 2    Right Knee Flexion 111                         [facility] Adult PT Treatment/Exercise - 07/24/20 0001      Knee/Hip Exercises: Standing   Heel Raises Both;1 set;10 reps    Heel Raises Limitations heel and toe raises    Gait Training 43ft with cues for heel-toe pattern and improved efficiency/safety on turns    Other Standing Knee Exercises attempted balance exercises but limited by pain L knee      Knee/Hip Exercises: Seated   Long Arc Quad Right;1 set;10 reps    Long Arc Quad Weight 2 lbs.    Sit to Sand --   attempted- L knee too painful to attempt today     Knee/Hip Exercises: Supine  Loss adjuster, chartered Limitations 5 second holds    Short Arc [employer] Strengthening;Right;15 reps    Short Arc [employer] Limitations 2# cuff weight    Bridges Both;1 set;10 reps    Straight Leg Raises Strengthening;Right;10 reps    Straight Leg Raises Limitations quad set each rep      Knee/Hip Exercises: Prone   Hamstring Curl 1 set;10 reps    Hamstring Curl Limitations 2# cuff weight    Hip Extension Right;1 set;5 reps      Manual Therapy   Manual Therapy Edema management    Manual therapy comments separate from all other skilled services     Edema Management edema massage                   PT Education - 07/24/20 1435    Education Details exercise form and purpose    Person(s) Educated Patient    Methods Explanation    Comprehension Verbalized understanding            PT Short Term Goals - 07/10/20 1259      PT SHORT TERM GOAL #1   Title Patient will be compliant to HEP updates provided    Time 1    Period Weeks    Status New    Target Date 07/17/20             PT Long Term Goals - 07/22/20 1645       PT LONG TERM GOAL #1   Title Patient will demonstrate no more than 3 degrees extension and at least 110 degrees flexion R knee    Time 4    Period Weeks    Status Achieved      PT LONG TERM GOAL #2   Title Patient will demonstrate an improvement of at least 1 MMT group in order to show improved strength    Time 4    Period Weeks    Status On-going      PT LONG TERM GOAL #3   Title Patient will be able to correctly sequence gait with SPC for unlimited distances with no increase in pain    Time 4    Period Weeks    Status On-going      PT LONG TERM GOAL #4   Title Patient will be able to navigate at least 4 steps with U rail without increase in pain    Time 4    Period Weeks    Status On-going      PT LONG TERM GOAL #5   Title Patient will report increased tolerance to functional tasks related to gardening in order to improve QOL    Time 4    Period Weeks    Status On-going                 Plan - 07/24/20 Jobos arrives today feeling well and ambulating nicely with her [facility]- reports that this device actually feels much better to her than the RW. ROM is improving nicely at 2-111 degrees AROM today. Continued efforts at reducing/controlling knee edema, otherwise spent time working more towards a strength based focus today. Definitely has some weakness in proximal hip muscles and around the knee, but seemed to tolerate session well. Progressing as expected following TKR procedure from perspective of R knee, L knee does remain quite painful and limits some closed chain tasks due to pain.  Personal Factors and Comorbidities Age;Fitness;Comorbidity 3+;Time since onset of injury/illness/exacerbation    Comorbidities CAD, OA, obesity    Examination-Activity Limitations Bed Mobility;Locomotion Level;Transfers;Carry;Sit;Squat;Dressing;Stairs;Stand    Examination-Participation Restrictions Church;Cleaning;Community Activity;Driving;Shop     Stability/Clinical Decision Making Stable/Uncomplicated    Clinical Decision Making Low    Rehab Potential Good    PT Frequency 3x / week    PT Duration 4 weeks    PT Treatment/Interventions ADLs/Self Care Home Management;Cryotherapy;DME Instruction;Gait training;Stair training;Functional mobility training;Therapeutic activities;Therapeutic exercise;Balance training;Neuromuscular re-education;Patient/family education;Manual techniques;Compression bandaging;Scar mobilization;Passive range of motion;Dry needling;Energy conservation;Taping;Vasopneumatic Device;Joint Manipulations    PT Next Visit Plan update HEP, continue to work on [place] but transition more towards strengthening and gait/balance training as tolerated    PT Home Exercise Plan Access Code: [email or web address]    Consulted and Agree with Plan of Care Patient           Patient will benefit from skilled therapeutic intervention in order to improve the following deficits and impairments:  Abnormal gait,Decreased range of motion,Difficulty walking,Increased fascial restricitons,Decreased safety awareness,Increased muscle spasms,Obesity,Decreased activity tolerance,Pain,Decreased balance,Decreased knowledge of use of DME,Decreased scar mobility,Hypomobility,Impaired flexibility,Decreased mobility,Decreased strength,Increased edema  Visit Diagnosis: Stiffness of right knee, not elsewhere classified  Acute pain of right knee  Muscle weakness (generalized)  Other abnormalities of gait and mobility     Problem List Patient Active Problem List   Diagnosis Date Noted  . Preop general physical exam 06/12/2020  . Bilateral primary osteoarthritis of knee 11/26/2016  . CAD S/P percutaneous coronary angioplasty 09/29/2016  . Unstable angina ([place]) 02/27/2016  . IFG (impaired fasting glucose) 11/20/2015  . 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 Regurgitation 12/10/2011  . Obese 10/14/2011  .  Barrett's esophagus 01/13/2011  . Tobacco use disorder 01/02/2011  . COLONIC POLYPS 10/10/2010  . ULCER OF ESOPHAGUS WITHOUT BLEEDING 10/10/2010  . GERD 08/15/2010  . 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

## 2020-07-26 ENCOUNTER — Other Ambulatory Visit: Payer: Self-pay

## 2020-07-26 ENCOUNTER — Ambulatory Visit (INDEPENDENT_AMBULATORY_CARE_PROVIDER_SITE_OTHER): Payer: Medicare HMO | Admitting: Physical Therapy

## 2020-07-26 DIAGNOSIS — M25661 Stiffness of right knee, not elsewhere classified: Secondary | ICD-10-CM

## 2020-07-26 DIAGNOSIS — R2689 Other abnormalities of gait and mobility: Secondary | ICD-10-CM | POA: Diagnosis not present

## 2020-07-26 DIAGNOSIS — M6281 Muscle weakness (generalized): Secondary | ICD-10-CM

## 2020-07-26 DIAGNOSIS — M25561 Pain in right knee: Secondary | ICD-10-CM

## 2020-07-26 NOTE — Patient Instructions (Signed)
Access Code: [email or web address]: [email or web address]: 12/17/2021Prepared by: [place]  Prone Quadriceps Set - 1 x daily - 7 x weekly - 3 sets - 10 reps  Prone Quadriceps Stretch with Strap - 1 x daily - 7 x weekly - 3 sets - 10 reps  Gastroc Stretch on Wall - 2 x daily - 7 x weekly - 1 sets - 2-3 reps - 20 seconds hold  Seated Hamstring Stretch - 2 x daily - 7 x weekly - 1 sets - 2-3 reps - 20 seconds hold  Sidelying Hip Abduction - 1 x daily - 7 x weekly - 1 sets - 10 reps  Supine Active Straight Leg Raise - 1 x daily - 7 x weekly - 1 sets - 15 reps  Sit to Stand - 1 x daily - 7 x weekly - 1 sets - 5-10 reps  Heel Toe Raises with Unilateral Counter Support - 1 x daily - 7 x weekly - 1 sets - 10 reps

## 2020-07-26 NOTE — Therapy (Signed)
[place] [facility] [place], [place], [address] Phone: [phone]   Fax:  [phone]  Physical Therapy Treatment  Patient Details  Name: Rachel Carr MRN: [id number] Date of Birth: 02/01/1951 Referring Provider (PT): Frederik Pear    Encounter Date: 07/26/2020   PT End of Session - 07/26/20 1154    Visit Number 7    Number of Visits 12    Date for PT Re-Evaluation 08/07/20    Authorization Type [employer]na Medicare    Authorization Time Period 07/10/20 to 08/07/20    Authorization - Visit Number 7    Authorization - Number of Visits 12    Progress Note Due on Visit 10    PT Start Time 1147    PT Stop Time 1225    PT Time Calculation (min) 38 min    Activity Tolerance Patient limited by pain    Behavior During Therapy [facility] for tasks assessed/performed           Past Medical History:  Diagnosis Date  . Barrett's syndrome   . CAD (coronary artery disease)   . Esophageal stricture   . Esophageal ulcer 09-2010   gastritis Dr. Glennon Hamilton  . Gastric polyps    colonoscopy 2012(due again for repeat 2017)  . Shingles    on the right eye    Past Surgical History:  Procedure Laterality Date  . CARPAL TUNNEL RELEASE    . CHOLECYSTECTOMY    . COMPLETE MASTECTOMY W/ SENTINEL NODE BIOPSY    . NISSEN FUNDOPLICATION  5456  . RADIOFREQUENCY ABLATION    . RT knee surgery    . TOTAL ABDOMINAL HYSTERECTOMY W/ BILATERAL SALPINGOOPHORECTOMY     endometriosis    There were no vitals filed for this visit.   Subjective Assessment - 07/26/20 1244    Subjective Pt reports her Rt knee is doing well.  Lt knee continues to be very painful.    Patient Stated Goals no more pain so I can do what I want to (gardening primarily)    Currently in Pain? Yes    Pain Score 8     Pain Location Knee    Pain Orientation Left              [facility] PT Assessment - 07/26/20 0001      Assessment   Medical Diagnosis s/p R TKR      Referring Provider (PT) Frederik Pear     Onset Date/Surgical Date 06/28/20    Next MD Visit Dr. Mayer Camel January 6th     Prior Therapy none       Strength   Right Hip Flexion 4+/5    Right Hip ABduction 4+/5    Right Hip ADduction 5/5    Right Knee Flexion 5/5            [facility] Adult PT Treatment/Exercise - 07/26/20 0001      Knee/Hip Exercises: Stretches   Knee: Self-Stretch Limitations seated scoot for Rt knee flexion x 5 reps, 10 sec hold.      Knee/Hip Exercises: Aerobic   Stationary Bike full slow revolutions for ROM; 2 min - unable to tolerated due to Lt knee pain.      Knee/Hip Exercises: Standing   Heel Raises Both;1 set;10 reps    Heel Raises Limitations heel and toe raises    Hip Abduction Right;Left;1 set;5 reps   UE on counter   Hip Extension Right;Left;1 set;5 reps   UE on counter  Forward Step Up Right;1 set;10 reps;Step Height: 6";Hand Hold: 2    Step Down Left;1 set;10 reps;Step Height: 4";Hand Hold: 2   and retro step up with RLE     Knee/Hip Exercises: Seated   Long Arc Quad Right;1 set;10 reps    Long Arc Quad Weight 3 lbs.      Knee/Hip Exercises: Prone   Hamstring Curl 1 set;10 reps    Hamstring Curl Limitations 3# cuff weight    Hip Extension Right;1 set;10 reps   feet off end of table                   PT Short Term Goals - 07/26/20 1203      PT SHORT TERM GOAL #1   Title Patient will be compliant to HEP updates provided    Time 1    Period Weeks    Status On-going    Target Date 07/17/20             PT Long Term Goals - 07/26/20 1242      PT LONG TERM GOAL #1   Title Patient will demonstrate no more than 3 degrees extension and at least 110 degrees flexion R knee    Time 4    Period Weeks    Status Achieved      PT LONG TERM GOAL #2   Title Patient will demonstrate an improvement of at least 1 MMT group in order to show improved strength    Time 4    Period Weeks    Status Achieved      PT LONG TERM GOAL #3   Title  Patient will be able to correctly sequence gait with SPC for unlimited distances with no increase in pain    Time 4    Period Weeks    Status Partially Met      PT LONG TERM GOAL #4   Title Patient will be able to navigate at least 4 steps with U rail without increase in pain    Time 4    Period Weeks    Status On-going      PT LONG TERM GOAL #5   Title Patient will report increased tolerance to functional tasks related to gardening in order to improve QOL    Time 4    Period Weeks    Status On-going                 Plan - 07/26/20 1241    Clinical Impression Statement Pt demonstrated improved RLE strength; has met strength goal.  Pt limited in exercises by Lt knee pain (both unweighted and weighted/standing).  Performed exercises to pt tolerance.  She tolerated stairs well, leading with RLE.  Pt is making great progress towards goals.    Personal Factors and Comorbidities Age;Fitness;Comorbidity 3+;Time since onset of injury/illness/exacerbation    Comorbidities CAD, OA, obesity    Examination-Activity Limitations Bed Mobility;Locomotion Level;Transfers;Carry;Sit;Squat;Dressing;Stairs;Stand    Examination-Participation Restrictions Church;Cleaning;Community Activity;Driving;Shop    Stability/Clinical Decision Making Stable/Uncomplicated    Rehab Potential Good    PT Frequency 3x / week    PT Duration 4 weeks    PT Treatment/Interventions ADLs/Self Care Home Management;Cryotherapy;DME Instruction;Gait training;Stair training;Functional mobility training;Therapeutic activities;Therapeutic exercise;Balance training;Neuromuscular re-education;Patient/family education;Manual techniques;Compression bandaging;Scar mobilization;Passive range of motion;Dry needling;Energy conservation;Taping;Vasopneumatic Device;Joint Manipulations    PT Next Visit Plan continue progressive RLE strengthening and gait.    PT Home Exercise Plan Access Code: [email or web address]    Consulted and Agree with Plan of  Care Patient           Patient will benefit from skilled therapeutic intervention in order to improve the following deficits and impairments:  Abnormal gait,Decreased range of motion,Difficulty walking,Increased fascial restricitons,Decreased safety awareness,Increased muscle spasms,Obesity,Decreased activity tolerance,Pain,Decreased balance,Decreased knowledge of use of DME,Decreased scar mobility,Hypomobility,Impaired flexibility,Decreased mobility,Decreased strength,Increased edema  Visit Diagnosis: Stiffness of right knee, not elsewhere classified  Acute pain of right knee  Muscle weakness (generalized)  Other abnormalities of gait and mobility     Problem List Patient Active Problem List   Diagnosis Date Noted  . Preop general physical exam 06/12/2020  . Bilateral primary osteoarthritis of knee 11/26/2016  . CAD S/P percutaneous coronary angioplasty 09/29/2016  . Unstable angina ([place]) 02/27/2016  . IFG (impaired fasting glucose) 11/20/2015  . 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 Regurgitation 12/10/2011  . Obese 10/14/2011  . Barrett's esophagus 01/13/2011  . Tobacco use disorder 01/02/2011  . COLONIC POLYPS 10/10/2010  . ULCER OF ESOPHAGUS WITHOUT BLEEDING 10/10/2010  . GERD 08/15/2010  . 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

## 2020-07-29 ENCOUNTER — Other Ambulatory Visit: Payer: Self-pay

## 2020-07-29 ENCOUNTER — Ambulatory Visit (INDEPENDENT_AMBULATORY_CARE_PROVIDER_SITE_OTHER): Payer: Medicare HMO | Admitting: Physical Therapy

## 2020-07-29 ENCOUNTER — Encounter: Payer: Self-pay | Admitting: Physical Therapy

## 2020-07-29 DIAGNOSIS — R2689 Other abnormalities of gait and mobility: Secondary | ICD-10-CM

## 2020-07-29 DIAGNOSIS — M6281 Muscle weakness (generalized): Secondary | ICD-10-CM | POA: Diagnosis not present

## 2020-07-29 DIAGNOSIS — M25661 Stiffness of right knee, not elsewhere classified: Secondary | ICD-10-CM

## 2020-07-29 DIAGNOSIS — M25561 Pain in right knee: Secondary | ICD-10-CM

## 2020-07-29 NOTE — Therapy (Signed)
[place], [place], [address] Phone: [phone]   Fax:  [phone]  Physical Therapy Treatment  Patient Details  Name: Rachel Carr MRN: [id number] Date of Birth: 12-Nov-1950 Referring Provider (PT): Frederik Pear    Encounter Date: 07/29/2020   PT End of Session - 07/29/20 1425    Visit Number 8    Number of Visits 12    Date for PT Re-Evaluation 08/07/20    Authorization Type [employer]    Authorization Time Period 07/10/20 to 08/07/20    Authorization - Visit Number 8    Authorization - Number of Visits 12    Progress Note Due on Visit 10    PT Start Time 9024    PT Stop Time 1435    PT Time Calculation (min) 50 min    Activity Tolerance Patient limited by pain    Behavior During Therapy [facility] for tasks assessed/performed           Past Medical History:  Diagnosis Date  . Barrett's syndrome   . CAD (coronary artery disease)   . Esophageal stricture   . Esophageal ulcer 09-2010   gastritis Dr. Glennon Hamilton  . Gastric polyps    colonoscopy 2012(due again for repeat 2017)  . Shingles    on the right eye    Past Surgical History:  Procedure Laterality Date  . CARPAL TUNNEL RELEASE    . CHOLECYSTECTOMY    . COMPLETE MASTECTOMY W/ SENTINEL NODE BIOPSY    . NISSEN FUNDOPLICATION  0973  . RADIOFREQUENCY ABLATION    . RT knee surgery    . TOTAL ABDOMINAL HYSTERECTOMY W/ BILATERAL SALPINGOOPHORECTOMY     endometriosis    There were no vitals filed for this visit.   Subjective Assessment - 07/29/20 1351    Subjective Pt reports her Lt knee is still bothering her and is worried it is hindering her progress with her Rt knee. says she has Lt knee surgery scheduled for June 2022    Patient Stated Goals no more pain so I can do what I want to (gardening primarily)    Currently in Pain? Yes    Pain Score 8     Pain Location Knee    Pain Orientation Left    Pain Descriptors /  Indicators Aching;Sore    Pain Type Chronic pain    Pain Onset More than a month ago                             [facility] Adult PT Treatment/Exercise - 07/29/20 0001      Knee/Hip Exercises: Stretches   Knee: Self-Stretch Limitations seated scoot for knee flexion 3 x 30 sec      Knee/Hip Exercises: Aerobic   Nustep L5: 5 min for warm up      Knee/Hip Exercises: Standing   Heel Raises Both;1 set;10 reps    Heel Raises Limitations heel and toe raises    Hip Abduction Stengthening;Both;2 sets;5 reps    Hip Extension Stengthening;Both;2 sets;5 reps    Lateral Step Up Right;1 set;10 reps;Hand Hold: 2;Step Height: 4"    Forward Step Up Right;1 set;10 reps;Hand Hold: 2;Step Height: 4"    Step Down Right;1 set;10 reps;Step Height: 4";Hand Hold: 2      Knee/Hip Exercises: Seated   Long Arc Quad Right;1 set;10 reps   3 sec hold with slow eccentric lowering   Long Arc  Quad Weight 2 lbs.      Knee/Hip Exercises: Prone   Hamstring Curl 2 sets;10 reps   feet off of end of table   Hamstring Curl Limitations 2#    Hip Extension Right;Strengthening;1 set;10 reps      Modalities   Modalities Cryotherapy      Cryotherapy   Number Minutes Cryotherapy 10 Minutes    Cryotherapy Location Knee   bilat   Type of Cryotherapy Ice pack      Manual Therapy   Passive ROM Rt knee flex and extension                    PT Short Term Goals - 07/26/20 1203      PT SHORT TERM GOAL #1   Title Patient will be compliant to HEP updates provided    Time 1    Period Weeks    Status On-going    Target Date 07/17/20             PT Long Term Goals - 07/26/20 1242      PT LONG TERM GOAL #1   Title Patient will demonstrate no more than 3 degrees extension and at least 110 degrees flexion R knee    Time 4    Period Weeks    Status Achieved      PT LONG TERM GOAL #2   Title Patient will demonstrate an improvement of at least 1 MMT group in order to show improved strength     Time 4    Period Weeks    Status Achieved      PT LONG TERM GOAL #3   Title Patient will be able to correctly sequence gait with SPC for unlimited distances with no increase in pain    Time 4    Period Weeks    Status Partially Met      PT LONG TERM GOAL #4   Title Patient will be able to navigate at least 4 steps with U rail without increase in pain    Time 4    Period Weeks    Status On-going      PT LONG TERM GOAL #5   Title Patient will report increased tolerance to functional tasks related to gardening in order to improve QOL    Time 4    Period Weeks    Status On-going                 Plan - 07/29/20 1413    Clinical Impression Statement Pt improving Rt knee ROM and strength. Continues to be limited by Lt knee pain. PT educated pt on aquatic therapy and recommended aquatics to reduce Lt knee pain.    Personal Factors and Comorbidities Age;Fitness;Comorbidity 3+;Time since onset of injury/illness/exacerbation    Examination-Activity Limitations Bed Mobility;Locomotion Level;Transfers;Carry;Sit;Squat;Dressing;Stairs;Stand    Examination-Participation Restrictions Church;Cleaning;Community Activity;Driving;Shop    Stability/Clinical Decision Making Stable/Uncomplicated    Rehab Potential Good    PT Frequency 3x / week    PT Duration 4 weeks    PT Treatment/Interventions ADLs/Self Care Home Management;Cryotherapy;DME Instruction;Gait training;Stair training;Functional mobility training;Therapeutic activities;Therapeutic exercise;Balance training;Neuromuscular re-education;Patient/family education;Manual techniques;Compression bandaging;Scar mobilization;Passive range of motion;Dry needling;Energy conservation;Taping;Vasopneumatic Device;Joint Manipulations    PT Next Visit Plan talk with pt about aquatic  therapy to reduce pain in Lt knee while strengthening Rt    PT Home Exercise Plan Access Code: [email or web address]           Patient will benefit from skilled therapeutic  intervention in order to improve the following deficits and impairments:  Abnormal gait,Decreased range of motion,Difficulty walking,Increased fascial restricitons,Decreased safety awareness,Increased muscle spasms,Obesity,Decreased activity tolerance,Pain,Decreased balance,Decreased knowledge of use of DME,Decreased scar mobility,Hypomobility,Impaired flexibility,Decreased mobility,Decreased strength,Increased edema  Visit Diagnosis: Acute pain of right knee  Stiffness of right knee, not elsewhere classified  Muscle weakness (generalized)  Other abnormalities of gait and mobility     Problem List Patient Active Problem List   Diagnosis Date Noted  . Preop general physical exam 06/12/2020  . Bilateral primary osteoarthritis of knee 11/26/2016  . CAD S/P percutaneous coronary angioplasty 09/29/2016  . Unstable angina ([place]) 02/27/2016  . IFG (impaired fasting glucose) 11/20/2015  . 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 Regurgitation 12/10/2011  . Obese 10/14/2011  . Barrett's esophagus 01/13/2011  . Tobacco use disorder 01/02/2011  . COLONIC POLYPS 10/10/2010  . ULCER OF ESOPHAGUS WITHOUT BLEEDING 10/10/2010  . GERD 08/15/2010  . 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

## 2020-07-31 ENCOUNTER — Encounter: Payer: Self-pay | Admitting: Physical Therapy

## 2020-07-31 ENCOUNTER — Ambulatory Visit (INDEPENDENT_AMBULATORY_CARE_PROVIDER_SITE_OTHER): Payer: Medicare HMO | Admitting: Physical Therapy

## 2020-07-31 DIAGNOSIS — M6281 Muscle weakness (generalized): Secondary | ICD-10-CM

## 2020-07-31 DIAGNOSIS — M25561 Pain in right knee: Secondary | ICD-10-CM

## 2020-07-31 DIAGNOSIS — R2689 Other abnormalities of gait and mobility: Secondary | ICD-10-CM | POA: Diagnosis not present

## 2020-07-31 DIAGNOSIS — M25661 Stiffness of right knee, not elsewhere classified: Secondary | ICD-10-CM

## 2020-07-31 NOTE — Patient Instructions (Signed)
   Aquatic Therapy: What to Expect!  Where:  [facility]  [address]  [place], [place]  [address] [phone]    NOTE: You will receive an automated phone message  reminding you of your appointment and it will say the   appointment is at [facility] [place]. We are  working to fix this - just know that you will meet us at pool!       How to Prepare: . Please make sure you drink 8 ounces of water about one hour prior to your pool session. . Sign in at the front desk upon your arrival.  Once on the pool deck, your therapist will ask you to sign the Patient  Consent and Assignment of Benefits form. . If you have a caregiver, they must attend the entire session with you (unless your primary therapists feels this is not necessary). The caregiver will be responsible for assisting with dressing as well as any toileting needs.  . Please arrive IN YOUR SUIT and a few minutes prior to your appointment - this helps to avoid delays in starting your session. . Please make sure to attend to any toileting needs prior to entering the pool. . You can bring your pool bag with you and place it on bench near our work space.   . Your therapist may take your blood pressure prior to, during, and after your session if indicated. . We usually try and create a home exercise program based on activities we do in the pool.  Please be thinking about who might be able to assist you in the pool should you want to participate in an aquatic home exercise program at the time of discharge.  Some patients do not want to or do not have the ability to participate in an aquatic home program - this is not a barrier in any way to you participating in aquatic therapy as part of your current therapy plan! About the pool: 1. Entering the pool: Your therapist will assist you; there are multiple ways to enter including stairs with railings, a walk-in ramp, a roll-in chair and a mechanical lift. Your  therapist will determine the most appropriate way for you. 2. Water temperature is usually between 86-87 degrees. 3. There may be other swimmers in the pool at the same time. 4. All sessions are 45 minutes.  Contact Info:  [facility] -  [address], [address] [place], [place] [address]     Please call our [place] office if you need to cancel or reschedule.   [phone]        

## 2020-07-31 NOTE — Therapy (Signed)
[place], [place], [address] Phone: [phone]   Fax:  [phone]  Physical Therapy Treatment  Patient Details  Name: Rachel Carr MRN: [id number] Date of Birth: March 05, 1951 Referring Provider (PT): Frederik Pear    Encounter Date: 07/31/2020   PT End of Session - 07/31/20 1424    Visit Number 9    Number of Visits 12    Date for PT Re-Evaluation 08/07/20    Authorization Type [employer] Medicare    Authorization Time Period 07/10/20 to 08/07/20    Authorization - Visit Number 9    Authorization - Number of Visits 12    Progress Note Due on Visit 10    PT Start Time 6962    PT Stop Time 1430    PT Time Calculation (min) 45 min    Activity Tolerance Patient tolerated treatment well    Behavior During Therapy [facility] for tasks assessed/performed           Past Medical History:  Diagnosis Date  . Barrett's syndrome   . CAD (coronary artery disease)   . Esophageal stricture   . Esophageal ulcer 09-2010   gastritis Dr. Glennon Hamilton  . Gastric polyps    colonoscopy 2012(due again for repeat 2017)  . Shingles    on the right eye    Past Surgical History:  Procedure Laterality Date  . CARPAL TUNNEL RELEASE    . CHOLECYSTECTOMY    . COMPLETE MASTECTOMY W/ SENTINEL NODE BIOPSY    . NISSEN FUNDOPLICATION  9528  . RADIOFREQUENCY ABLATION    . RT knee surgery    . TOTAL ABDOMINAL HYSTERECTOMY W/ BILATERAL SALPINGOOPHORECTOMY     endometriosis    There were no vitals filed for this visit.   Subjective Assessment - 07/31/20 1348    Subjective Pt reports she thinks she "pulled a muscle" in her right knee cleaning. Says left knee is "the same"    Patient Stated Goals no more pain so I can do what I want to (gardening primarily)    Currently in Pain? Yes    Pain Score 8     Pain Location Knee    Pain Orientation Left    Pain Descriptors / Indicators Aching;Sore    Pain Onset More than a month ago                              [facility] Adult PT Treatment/Exercise - 07/31/20 0001      Knee/Hip Exercises: Aerobic   Nustep L5: 5 min for warm up      Knee/Hip Exercises: Standing   Heel Raises Both;15 reps    Heel Raises Limitations heel/toe raises    Hip Abduction Stengthening;Both;15 reps   2#   Hip Extension Stengthening;Both;15 reps    Extension Limitations 2#    Lateral Step Up Right;1 set;10 reps;Hand Hold: 1;Step Height: 4"    Forward Step Up Right;1 set;10 reps;Hand Hold: 2;Step Height: 4"    Step Down Right;1 set;10 reps;Hand Hold: 2;Step Height: 4"      Knee/Hip Exercises: Seated   Long Arc Quad Strengthening;Right;2 sets;10 reps    Long Arc Quad Weight 2 lbs.      Knee/Hip Exercises: Prone   Hamstring Curl 2 sets;10 reps    Hamstring Curl Limitations no weight due to increased pain today      Cryotherapy   Number Minutes Cryotherapy 10 Minutes  Cryotherapy Location Knee   bilat   Type of Cryotherapy Ice pack      Manual Therapy   Soft tissue mobilization STM, TPR to Rt hamstrings to decrease pain due to muscle pull    Passive ROM RT knee flex/ext                  PT Education - 07/31/20 1423    Education Details pt given handout on aquatic therapy and educated on recommendation to try aquatics to reduce Lt knee pain while progressing Rt knee    Person(s) Educated Patient    Methods Explanation;Demonstration;Handout    Comprehension Verbalized understanding            PT Short Term Goals - 07/26/20 1203      PT SHORT TERM GOAL #1   Title Patient will be compliant to HEP updates provided    Time 1    Period Weeks    Status On-going    Target Date 07/17/20             PT Long Term Goals - 07/26/20 1242      PT LONG TERM GOAL #1   Title Patient will demonstrate no more than 3 degrees extension and at least 110 degrees flexion R knee    Time 4    Period Weeks    Status Achieved      PT LONG TERM GOAL #2   Title  Patient will demonstrate an improvement of at least 1 MMT group in order to show improved strength    Time 4    Period Weeks    Status Achieved      PT LONG TERM GOAL #3   Title Patient will be able to correctly sequence gait with SPC for unlimited distances with no increase in pain    Time 4    Period Weeks    Status Partially Met      PT LONG TERM GOAL #4   Title Patient will be able to navigate at least 4 steps with U rail without increase in pain    Time 4    Period Weeks    Status On-going      PT LONG TERM GOAL #5   Title Patient will report increased tolerance to functional tasks related to gardening in order to improve QOL    Time 4    Period Weeks    Status On-going                 Plan - 07/31/20 1424    Clinical Impression Statement Pt responded well to manual therapy to hamstring to reduce pain. Able to add weight to standing exercises today    Personal Factors and Comorbidities Age;Fitness;Comorbidity 3+;Time since onset of injury/illness/exacerbation    Comorbidities CAD, OA, obesity    Examination-Activity Limitations Bed Mobility;Locomotion Level;Transfers;Carry;Sit;Squat;Dressing;Stairs;Stand    Examination-Participation Restrictions Church;Cleaning;Community Activity;Driving;Shop    Stability/Clinical Decision Making Stable/Uncomplicated    Rehab Potential Good    PT Frequency 3x / week    PT Duration 4 weeks    PT Treatment/Interventions ADLs/Self Care Home Management;Cryotherapy;DME Instruction;Gait training;Stair training;Functional mobility training;Therapeutic activities;Therapeutic exercise;Balance training;Neuromuscular re-education;Patient/family education;Manual techniques;Compression bandaging;Scar mobilization;Passive range of motion;Dry needling;Energy conservation;Taping;Vasopneumatic Device;Joint Manipulations    PT Next Visit Plan continue to progress knee strength and ROM as tolerated    PT Home Exercise Plan Access Code: [email or web address]     Consulted and Agree with Plan of Care Patient  Patient will benefit from skilled therapeutic intervention in order to improve the following deficits and impairments:  Abnormal gait,Decreased range of motion,Difficulty walking,Increased fascial restricitons,Decreased safety awareness,Increased muscle spasms,Obesity,Decreased activity tolerance,Pain,Decreased balance,Decreased knowledge of use of DME,Decreased scar mobility,Hypomobility,Impaired flexibility,Decreased mobility,Decreased strength,Increased edema  Visit Diagnosis: Muscle weakness (generalized)  Other abnormalities of gait and mobility  Stiffness of right knee, not elsewhere classified  Acute pain of right knee     Problem List Patient Active Problem List   Diagnosis Date Noted  . Preop general physical exam 06/12/2020  . Bilateral primary osteoarthritis of knee 11/26/2016  . CAD S/P percutaneous coronary angioplasty 09/29/2016  . Unstable angina ([place]) 02/27/2016  . IFG (impaired fasting glucose) 11/20/2015  . 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 Regurgitation 12/10/2011  . Obese 10/14/2011  . Barrett's esophagus 01/13/2011  . Tobacco use disorder 01/02/2011  . COLONIC POLYPS 10/10/2010  . ULCER OF ESOPHAGUS WITHOUT BLEEDING 10/10/2010  . GERD 08/15/2010  . 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

## 2020-08-05 ENCOUNTER — Encounter: Payer: Medicare HMO | Admitting: Physical Therapy

## 2020-08-07 ENCOUNTER — Encounter: Payer: Self-pay | Admitting: Physical Therapy

## 2020-08-07 ENCOUNTER — Other Ambulatory Visit: Payer: Self-pay

## 2020-08-07 ENCOUNTER — Ambulatory Visit (INDEPENDENT_AMBULATORY_CARE_PROVIDER_SITE_OTHER): Payer: Medicare HMO | Admitting: Physical Therapy

## 2020-08-07 DIAGNOSIS — R2689 Other abnormalities of gait and mobility: Secondary | ICD-10-CM

## 2020-08-07 DIAGNOSIS — M6281 Muscle weakness (generalized): Secondary | ICD-10-CM | POA: Diagnosis not present

## 2020-08-07 DIAGNOSIS — M25661 Stiffness of right knee, not elsewhere classified: Secondary | ICD-10-CM

## 2020-08-07 DIAGNOSIS — M25561 Pain in right knee: Secondary | ICD-10-CM | POA: Diagnosis not present

## 2020-08-07 NOTE — Therapy (Addendum)
[place], [place], [address] Phone: [phone]   Fax:  [phone]  Physical Therapy Treatment and 10th visit progress note and Discharge Patient Details  Name: Rachel Carr MRN: [id number] Date of Birth: 08/29/1950 Referring Provider (PT): Frederik Pear   Dates of service: 07/10/20-08/07/20 Encounter Date: 08/07/2020   PT End of Session - 08/07/20 1351    Visit Number 10    Number of Visits 12    Date for PT Re-Evaluation 08/07/20    Authorization Type [employer] Medicare    Authorization Time Period 07/10/20 to 08/07/20    Authorization - Visit Number 10    Authorization - Number of Visits 12    Progress Note Due on Visit 10    PT Start Time 5638    PT Stop Time 1430    PT Time Calculation (min) 42 min    Activity Tolerance Patient tolerated treatment well    Behavior During Therapy [facility] for tasks assessed/performed           Past Medical History:  Diagnosis Date  . Barrett's syndrome   . CAD (coronary artery disease)   . Esophageal stricture   . Esophageal ulcer 09-2010   gastritis Dr. Glennon Hamilton  . Gastric polyps    colonoscopy 2012(due again for repeat 2017)  . Shingles    on the right eye    Past Surgical History:  Procedure Laterality Date  . CARPAL TUNNEL RELEASE    . CHOLECYSTECTOMY    . COMPLETE MASTECTOMY W/ SENTINEL NODE BIOPSY    . NISSEN FUNDOPLICATION  9373  . RADIOFREQUENCY ABLATION    . RT knee surgery    . TOTAL ABDOMINAL HYSTERECTOMY W/ BILATERAL SALPINGOOPHORECTOMY     endometriosis    There were no vitals filed for this visit.   Subjective Assessment - 08/07/20 1352    Subjective Pt reports she has been working on her exercises at home.  "I still have to work on my balance, and lifting it up to the back when laying on my stomach".    Patient Stated Goals no more pain so I can do what I want to (gardening primarily)    Currently in Pain? Yes    Pain Score 8      Pain Location Knee    Pain Orientation Left    Pain Descriptors / Indicators Aching;Sharp    Pain Onset More than a month ago    Aggravating Factors  everything    Pain Relieving Factors nothing              [facility] PT Assessment - 08/07/20 0001      Assessment   Medical Diagnosis s/p R TKR     Referring Provider (PT) Frederik Pear     Onset Date/Surgical Date 06/28/20    Next MD Visit Dr. Mayer Camel January 6th     Prior Therapy none       AROM   Right Knee Extension 2    Right Knee Flexion 110      PROM   Right Knee Extension 0      Strength   Right Hip Flexion 5/5    Right Hip Extension 4/5    Right Hip ABduction 4+/5    Right Knee Flexion 5/5    Right Knee Extension 5/5            [facility] Adult PT Treatment/Exercise - 08/07/20 0001      Self-Care   Self-Care  Other Self-Care Comments    Other Self-Care Comments  Pt educated on scar mobilization; demo provided. Pt verbalized understanding.      Knee/Hip Exercises: Stretches   Passive Hamstring Stretch Right;3 reps;20 seconds    Quad Stretch Right;20 seconds;3 reps   prone with strap   Knee: Self-Stretch Limitations seated scoots x 5 reps of 10 sec, 1 rep of heel slide with strap assist RLE.    Gastroc Stretch Both;1 rep;20 seconds   heels off of step   Gastroc Stretch Limitations LLE not tolerated      Knee/Hip Exercises: Aerobic   Nustep L4: 7 min for ROM   PTA present to discuss progress     Knee/Hip Exercises: Standing   Heel Raises Both;1 set;10 reps    Heel Raises Limitations heel/toe raises    Forward Step Up Right;1 set;10 reps;Hand Hold: 2;Step Height: 6"    Other Standing Knee Exercises side stepping at counter x 10 ft Rt/Lt    Other Standing Knee Exercises tandem stance with Rt foot back x 20 sec x 2 reps      Knee/Hip Exercises: Sidelying   Hip ABduction Strengthening;Right;1 set;15 reps      Knee/Hip Exercises: Prone   Hip Extension Strengthening;Right;Left;1 set;10 reps      Modalities    Modalities --   pt declined; will use ice at home.                   PT Short Term Goals - 07/26/20 1203      PT SHORT TERM GOAL #1   Title Patient will be compliant to HEP updates provided    Time 1    Period Weeks    Status Achieved    Target Date 07/17/20             PT Long Term Goals - 08/07/20 1419      PT LONG TERM GOAL #1   Title Patient will demonstrate no more than 3 degrees extension and at least 110 degrees flexion R knee    Time 4    Period Weeks    Status Achieved      PT LONG TERM GOAL #2   Title Patient will demonstrate an improvement of at least 1 MMT group in order to show improved strength    Time 4    Period Weeks    Status Achieved      PT LONG TERM GOAL #3   Title Patient will be able to correctly sequence gait with SPC for unlimited distances with no increase in pain    Time 4    Period Weeks    Status Partially Met      PT LONG TERM GOAL #4   Title Patient will be able to navigate at least 4 steps with U rail without increase in pain    Baseline when leading with RLE, able to ascend/descend without pain.    Time 4    Period Weeks    Status Achieved      PT LONG TERM GOAL #5   Title Patient will report increased tolerance to functional tasks related to gardening in order to improve QOL    Time 4    Period Weeks    Status On-going                 Plan - 08/07/20 1431    Clinical Impression Statement Pt's Rt knee ROM remains similar to last assessment (0-110 deg).  She continues  to have difficulty tolerating standing exercises due to increased Lt knee pain in WB positions. She is pleased with progress so far.  Pt has met most of her goals; would benefit from additional visits to assist with return to recreational/functional activities.    Personal Factors and Comorbidities Age;Fitness;Comorbidity 3+;Time since onset of injury/illness/exacerbation    Comorbidities CAD, OA, obesity    Examination-Activity Limitations Bed  Mobility;Locomotion Level;Transfers;Carry;Sit;Squat;Dressing;Stairs;Stand    Examination-Participation Restrictions Church;Cleaning;Community Activity;Driving;Shop    Stability/Clinical Decision Making Stable/Uncomplicated    Rehab Potential Good    PT Frequency 3x / week    PT Duration 4 weeks    PT Treatment/Interventions ADLs/Self Care Home Management;Cryotherapy;DME Instruction;Gait training;Stair training;Functional mobility training;Therapeutic activities;Therapeutic exercise;Balance training;Neuromuscular re-education;Patient/family education;Manual techniques;Compression bandaging;Scar mobilization;Passive range of motion;Dry needling;Energy conservation;Taping;Vasopneumatic Device;Joint Manipulations    PT Next Visit Plan end of POC; reassess goals. FOTO.    PT Home Exercise Plan Access Code: [email or web address]    Consulted and Agree with Plan of Care Patient           Patient will benefit from skilled therapeutic intervention in order to improve the following deficits and impairments:  Abnormal gait,Decreased range of motion,Difficulty walking,Increased fascial restricitons,Decreased safety awareness,Increased muscle spasms,Obesity,Decreased activity tolerance,Pain,Decreased balance,Decreased knowledge of use of DME,Decreased scar mobility,Hypomobility,Impaired flexibility,Decreased mobility,Decreased strength,Increased edema  Visit Diagnosis: Muscle weakness (generalized)  Other abnormalities of gait and mobility  Stiffness of right knee, not elsewhere classified  Acute pain of right knee     Problem List Patient Active Problem List   Diagnosis Date Noted  . Preop general physical exam 06/12/2020  . Bilateral primary osteoarthritis of knee 11/26/2016  . CAD S/P percutaneous coronary angioplasty 09/29/2016  . Unstable angina ([place]) 02/27/2016  . IFG (impaired fasting glucose) 11/20/2015  . 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  Regurgitation 12/10/2011  . Obese 10/14/2011  . Barrett's esophagus 01/13/2011  . Tobacco use disorder 01/02/2011  . COLONIC POLYPS 10/10/2010  . ULCER OF ESOPHAGUS WITHOUT BLEEDING 10/10/2010  . GERD 08/15/2010  . 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  Patient goals were partially met. Patient is being discharged due to meeting the stated rehab goals.  ?????    Isabelle Course, PT   Kerin Perna, PTA 08/07/20 2:35 PM  [place], [place], [address] Phone: [phone]   Fax:  [phone]  Name: Shakeisha Horine MRN: [id number] Date of Birth: [birth year]  Isabelle Course, PT

## 2020-08-20 ENCOUNTER — Telehealth: Payer: Self-pay | Admitting: Cardiology

## 2020-08-20 NOTE — Telephone Encounter (Signed)
   [facility] Pre-operative Risk Assessment    HEARTCARE STAFF: - Please ensure there is not already an duplicate clearance open for this procedure. - Under Visit Info/Reason for Call, type in Other and utilize the format Clearance MM/DD/YY or Clearance TBD. Do not use dashes or single digits. - If request is for dental extraction, please clarify the # of teeth to be extracted.  Request for surgical clearance:  1. What type of surgery is being performed? Left Knee Replacement  2. When is this surgery scheduled? 09/20/20  3. What type of clearance is required (medical clearance vs. Pharmacy clearance to hold med vs. Both)? Both  4. Are there any medications that need to be held prior to surgery and how long? Aspirin   5. Practice name and name of physician performing surgery? [facility]; Dr. Frederik Pear  6. What is the office phone number? [phone]   7.   What is the office fax number? [phone]  8.   Anesthesia type (None, local, MAC, general) ? Spinal   Rachel Carr 08/20/2020, 3:08 PM  _________________________________________________________________   (provider comments below)

## 2020-08-20 NOTE — Telephone Encounter (Signed)
   Primary Cardiologist: Kirk Ruths, MD  Chart reviewed as part of pre-operative protocol coverage. Given past medical history and time since last visit, based on ACC/AHA guidelines, Rachel Carr would be at acceptable risk for the planned procedure without further cardiovascular testing.  Recent nuclear stress test was low risk.  It is ideal to continue on aspirin through the surgery, however if absolutely needed, may hold aspirin for 5 to 7 days and restart afterward.  The patient was advised that if she develops new symptoms prior to surgery to contact our office to arrange for a follow-up visit, and she verbalized understanding.  I will route this recommendation to the requesting party via Epic fax function and remove from pre-op pool.  Please call with questions.  [place], [place] 08/20/2020, 4:02 PM

## 2020-08-21 ENCOUNTER — Encounter: Payer: Medicare HMO | Admitting: Physical Therapy

## 2020-08-26 ENCOUNTER — Encounter: Payer: Self-pay | Admitting: Family Medicine

## 2020-08-28 ENCOUNTER — Telehealth: Payer: Self-pay

## 2020-08-28 NOTE — Telephone Encounter (Signed)
[facility] called and left a message wanting to know if you have received the surgical clearance.   Phone [phone] Fax [phone]

## 2020-08-28 NOTE — Telephone Encounter (Signed)
I haven't seen this

## 2020-09-02 NOTE — Telephone Encounter (Signed)
Call for form, I haven't seen it either

## 2020-09-03 NOTE — Telephone Encounter (Signed)
Pt's surgical clearance has been faxed. Confirmation received.

## 2020-09-06 ENCOUNTER — Telehealth: Payer: Self-pay

## 2020-09-06 DIAGNOSIS — Z01818 Encounter for other preprocedural examination: Secondary | ICD-10-CM

## 2020-09-06 NOTE — Telephone Encounter (Signed)
[facility] called and states they did receive a surgical clearance but did not receive labs. They need CBC/Diff, CMP, INR, HgbA1C within 30 days of procedure. Pended labs. Is it ok to order?

## 2020-09-06 NOTE — Telephone Encounter (Signed)
Lab order signed.

## 2020-09-09 ENCOUNTER — Telehealth: Payer: Self-pay | Admitting: Family Medicine

## 2020-09-09 ENCOUNTER — Ambulatory Visit: Payer: Medicare HMO | Admitting: Family Medicine

## 2020-09-09 DIAGNOSIS — Z01818 Encounter for other preprocedural examination: Secondary | ICD-10-CM | POA: Diagnosis not present

## 2020-09-09 DIAGNOSIS — R7301 Impaired fasting glucose: Secondary | ICD-10-CM | POA: Diagnosis not present

## 2020-09-09 NOTE — Telephone Encounter (Signed)
Patient walked in and states that [place] told her to drop off these [facility] forms so I am placing in Dr. Gardiner Ramus basket

## 2020-09-10 LAB — CBC WITH DIFFERENTIAL/PLATELET
Absolute Monocytes: 697 cells/uL (ref 200–950)
Basophils Absolute: 104 cells/uL (ref 0–200)
Basophils Relative: 1.5 %
Eosinophils Absolute: 124 cells/uL (ref 15–500)
Eosinophils Relative: 1.8 %
HCT: 40.5 % (ref 35.0–45.0)
Hemoglobin: 13.7 g/dL (ref 11.7–15.5)
Lymphs Abs: 2408 cells/uL (ref 850–3900)
MCH: 30.9 pg (ref 27.0–33.0)
MCHC: 33.8 g/dL (ref 32.0–36.0)
MCV: 91.4 fL (ref 80.0–100.0)
MPV: 11.3 fL (ref 7.5–12.5)
Monocytes Relative: 10.1 %
Neutro Abs: 3567 cells/uL (ref 1500–7800)
Neutrophils Relative %: 51.7 %
Platelets: 266 10*3/uL (ref 140–400)
RBC: 4.43 10*6/uL (ref 3.80–5.10)
RDW: 12.2 % (ref 11.0–15.0)
Total Lymphocyte: 34.9 %
WBC: 6.9 10*3/uL (ref 3.8–10.8)

## 2020-09-10 LAB — COMPLETE METABOLIC PANEL WITH GFR
AG Ratio: 1.8 (calc) (ref 1.0–2.5)
ALT: 9 U/L (ref 6–29)
AST: 10 U/L (ref 10–35)
Albumin: 4.2 g/dL (ref 3.6–5.1)
Alkaline phosphatase (APISO): 99 U/L (ref 37–153)
BUN: 14 mg/dL (ref 7–25)
CO2: 29 mmol/L (ref 20–32)
Calcium: 9.3 mg/dL (ref 8.6–10.4)
Chloride: 104 mmol/L (ref 98–110)
Creat: 0.8 mg/dL (ref 0.50–0.99)
GFR, Est African American: 87 mL/min/{1.73_m2} (ref >=60)
GFR, Est Non African American: 75 mL/min/{1.73_m2} (ref >=60)
Globulin: 2.4 g/dL (calc) (ref 1.9–3.7)
Glucose, Bld: 91 mg/dL (ref 65–99)
Potassium: 4.2 mmol/L (ref 3.5–5.3)
Sodium: 140 mmol/L (ref 135–146)
Total Bilirubin: 0.6 mg/dL (ref 0.2–1.2)
Total Protein: 6.6 g/dL (ref 6.1–8.1)

## 2020-09-10 LAB — PROTIME-INR
INR: 1
Prothrombin Time: 10 s (ref 9.0–11.5)

## 2020-09-10 LAB — HEMOGLOBIN A1C
Hgb A1c MFr Bld: 6.5 % of total Hgb — ABNORMAL HIGH (ref <5.7)
Mean Plasma Glucose: 140 mg/dL
eAG (mmol/L): 7.7 mmol/L

## 2020-09-10 NOTE — Telephone Encounter (Signed)
Left message advising patient to go to the lab.

## 2020-09-11 DIAGNOSIS — M1712 Unilateral primary osteoarthritis, left knee: Secondary | ICD-10-CM | POA: Diagnosis not present

## 2020-09-11 NOTE — Telephone Encounter (Signed)
Forms signed and given to [place]. Please attach recent labs and fax

## 2020-09-20 DIAGNOSIS — M1712 Unilateral primary osteoarthritis, left knee: Secondary | ICD-10-CM | POA: Diagnosis not present

## 2020-09-20 DIAGNOSIS — G8918 Other acute postprocedural pain: Secondary | ICD-10-CM | POA: Diagnosis not present

## 2020-09-21 DIAGNOSIS — Z7984 Long term (current) use of oral hypoglycemic drugs: Secondary | ICD-10-CM | POA: Diagnosis not present

## 2020-09-21 DIAGNOSIS — Z471 Aftercare following joint replacement surgery: Secondary | ICD-10-CM | POA: Diagnosis not present

## 2020-09-21 DIAGNOSIS — K2270 Barrett's esophagus without dysplasia: Secondary | ICD-10-CM | POA: Diagnosis not present

## 2020-09-21 DIAGNOSIS — Z955 Presence of coronary angioplasty implant and graft: Secondary | ICD-10-CM | POA: Diagnosis not present

## 2020-09-21 DIAGNOSIS — R7303 Prediabetes: Secondary | ICD-10-CM | POA: Diagnosis not present

## 2020-09-21 DIAGNOSIS — R3915 Urgency of urination: Secondary | ICD-10-CM | POA: Diagnosis not present

## 2020-09-21 DIAGNOSIS — Z7982 Long term (current) use of aspirin: Secondary | ICD-10-CM | POA: Diagnosis not present

## 2020-09-21 DIAGNOSIS — R32 Unspecified urinary incontinence: Secondary | ICD-10-CM | POA: Diagnosis not present

## 2020-09-21 DIAGNOSIS — Z96653 Presence of artificial knee joint, bilateral: Secondary | ICD-10-CM | POA: Diagnosis not present

## 2020-09-21 DIAGNOSIS — K5900 Constipation, unspecified: Secondary | ICD-10-CM | POA: Diagnosis not present

## 2020-09-23 DIAGNOSIS — Z96653 Presence of artificial knee joint, bilateral: Secondary | ICD-10-CM | POA: Diagnosis not present

## 2020-09-23 DIAGNOSIS — R7303 Prediabetes: Secondary | ICD-10-CM | POA: Diagnosis not present

## 2020-09-23 DIAGNOSIS — Z7984 Long term (current) use of oral hypoglycemic drugs: Secondary | ICD-10-CM | POA: Diagnosis not present

## 2020-09-23 DIAGNOSIS — R3915 Urgency of urination: Secondary | ICD-10-CM | POA: Diagnosis not present

## 2020-09-23 DIAGNOSIS — Z7982 Long term (current) use of aspirin: Secondary | ICD-10-CM | POA: Diagnosis not present

## 2020-09-23 DIAGNOSIS — Z471 Aftercare following joint replacement surgery: Secondary | ICD-10-CM | POA: Diagnosis not present

## 2020-09-23 DIAGNOSIS — Z955 Presence of coronary angioplasty implant and graft: Secondary | ICD-10-CM | POA: Diagnosis not present

## 2020-09-23 DIAGNOSIS — K5900 Constipation, unspecified: Secondary | ICD-10-CM | POA: Diagnosis not present

## 2020-09-23 DIAGNOSIS — K2270 Barrett's esophagus without dysplasia: Secondary | ICD-10-CM | POA: Diagnosis not present

## 2020-09-23 DIAGNOSIS — R32 Unspecified urinary incontinence: Secondary | ICD-10-CM | POA: Diagnosis not present

## 2020-09-25 DIAGNOSIS — R7303 Prediabetes: Secondary | ICD-10-CM | POA: Diagnosis not present

## 2020-09-25 DIAGNOSIS — Z7984 Long term (current) use of oral hypoglycemic drugs: Secondary | ICD-10-CM | POA: Diagnosis not present

## 2020-09-25 DIAGNOSIS — Z955 Presence of coronary angioplasty implant and graft: Secondary | ICD-10-CM | POA: Diagnosis not present

## 2020-09-25 DIAGNOSIS — Z7982 Long term (current) use of aspirin: Secondary | ICD-10-CM | POA: Diagnosis not present

## 2020-09-25 DIAGNOSIS — K2270 Barrett's esophagus without dysplasia: Secondary | ICD-10-CM | POA: Diagnosis not present

## 2020-09-25 DIAGNOSIS — Z96653 Presence of artificial knee joint, bilateral: Secondary | ICD-10-CM | POA: Diagnosis not present

## 2020-09-25 DIAGNOSIS — Z471 Aftercare following joint replacement surgery: Secondary | ICD-10-CM | POA: Diagnosis not present

## 2020-09-25 DIAGNOSIS — K5900 Constipation, unspecified: Secondary | ICD-10-CM | POA: Diagnosis not present

## 2020-09-25 DIAGNOSIS — R3915 Urgency of urination: Secondary | ICD-10-CM | POA: Diagnosis not present

## 2020-09-25 DIAGNOSIS — R32 Unspecified urinary incontinence: Secondary | ICD-10-CM | POA: Diagnosis not present

## 2020-09-26 DIAGNOSIS — Z471 Aftercare following joint replacement surgery: Secondary | ICD-10-CM | POA: Diagnosis not present

## 2020-09-26 DIAGNOSIS — R7303 Prediabetes: Secondary | ICD-10-CM | POA: Diagnosis not present

## 2020-09-26 DIAGNOSIS — Z7984 Long term (current) use of oral hypoglycemic drugs: Secondary | ICD-10-CM | POA: Diagnosis not present

## 2020-09-26 DIAGNOSIS — Z7982 Long term (current) use of aspirin: Secondary | ICD-10-CM | POA: Diagnosis not present

## 2020-09-26 DIAGNOSIS — R32 Unspecified urinary incontinence: Secondary | ICD-10-CM | POA: Diagnosis not present

## 2020-09-26 DIAGNOSIS — Z96653 Presence of artificial knee joint, bilateral: Secondary | ICD-10-CM | POA: Diagnosis not present

## 2020-09-26 DIAGNOSIS — R3915 Urgency of urination: Secondary | ICD-10-CM | POA: Diagnosis not present

## 2020-09-26 DIAGNOSIS — K2270 Barrett's esophagus without dysplasia: Secondary | ICD-10-CM | POA: Diagnosis not present

## 2020-09-26 DIAGNOSIS — Z955 Presence of coronary angioplasty implant and graft: Secondary | ICD-10-CM | POA: Diagnosis not present

## 2020-09-26 DIAGNOSIS — K5900 Constipation, unspecified: Secondary | ICD-10-CM | POA: Diagnosis not present

## 2020-09-30 DIAGNOSIS — R32 Unspecified urinary incontinence: Secondary | ICD-10-CM | POA: Diagnosis not present

## 2020-09-30 DIAGNOSIS — R3915 Urgency of urination: Secondary | ICD-10-CM | POA: Diagnosis not present

## 2020-09-30 DIAGNOSIS — R7303 Prediabetes: Secondary | ICD-10-CM | POA: Diagnosis not present

## 2020-09-30 DIAGNOSIS — Z7984 Long term (current) use of oral hypoglycemic drugs: Secondary | ICD-10-CM | POA: Diagnosis not present

## 2020-09-30 DIAGNOSIS — Z7982 Long term (current) use of aspirin: Secondary | ICD-10-CM | POA: Diagnosis not present

## 2020-09-30 DIAGNOSIS — K2270 Barrett's esophagus without dysplasia: Secondary | ICD-10-CM | POA: Diagnosis not present

## 2020-09-30 DIAGNOSIS — Z96653 Presence of artificial knee joint, bilateral: Secondary | ICD-10-CM | POA: Diagnosis not present

## 2020-09-30 DIAGNOSIS — Z471 Aftercare following joint replacement surgery: Secondary | ICD-10-CM | POA: Diagnosis not present

## 2020-09-30 DIAGNOSIS — Z955 Presence of coronary angioplasty implant and graft: Secondary | ICD-10-CM | POA: Diagnosis not present

## 2020-09-30 DIAGNOSIS — K5900 Constipation, unspecified: Secondary | ICD-10-CM | POA: Diagnosis not present

## 2020-10-01 DIAGNOSIS — Z471 Aftercare following joint replacement surgery: Secondary | ICD-10-CM | POA: Diagnosis not present

## 2020-10-01 DIAGNOSIS — Z96652 Presence of left artificial knee joint: Secondary | ICD-10-CM | POA: Diagnosis not present

## 2020-10-02 ENCOUNTER — Other Ambulatory Visit: Payer: Self-pay

## 2020-10-02 ENCOUNTER — Ambulatory Visit (INDEPENDENT_AMBULATORY_CARE_PROVIDER_SITE_OTHER): Payer: Medicare HMO | Admitting: Physical Therapy

## 2020-10-02 ENCOUNTER — Encounter: Payer: Self-pay | Admitting: Physical Therapy

## 2020-10-02 DIAGNOSIS — M6281 Muscle weakness (generalized): Secondary | ICD-10-CM | POA: Diagnosis not present

## 2020-10-02 DIAGNOSIS — M25562 Pain in left knee: Secondary | ICD-10-CM

## 2020-10-02 DIAGNOSIS — R2689 Other abnormalities of gait and mobility: Secondary | ICD-10-CM | POA: Diagnosis not present

## 2020-10-02 NOTE — Patient Instructions (Addendum)
   Aquatic Therapy: What to Expect!  Where:  [facility] [address] [place], [place] [address] [phone]           How to Prepare: . Please make sure you drink 8 ounces of water about one hour prior to your pool session . A caregiver must attend the entire session with the patient (unless your primary therapists feels this is not necessary). The caregiver will be responsible for assisting with dressing as well as any toileting needs.  . Please arrive IN YOUR SUIT and a few minutes prior to your appointment - this helps to avoid delays in starting your session. . Please make sure to attend to any toileting needs prior to entering the pool. . Once on the pool deck your therapist will ask you to sign the Patient  Consent and Assignment of Benefits form. . Your therapist may take your blood pressure prior to, during and after your session if indicated. . We usually try and create a home exercise program based on activities we do in the pool.  Please be thinking about who might be able to assist you in the pool should you want to participate in an aquatic home exercise program at the time of discharge.  Some patients do not want to or do not have the ability to participate in an aquatic home program - this is not a barrier in any way to you participating in aquatic therapy as part of your current therapy plan!  Appointments:  All sessions are 45 minutes  About the pool: 1. Entering the pool Your therapist will assist you; there are two ways to enter the pool - stairs or a mechanical lift. Your therapist will determine the most appropriate way for you. 2. Water temperature is usually around 92.  There is a lap pool with a temperature around 84 3. There may be other swimmers in the pool at the same time.   Contact Info:             To cancel appointment, please call [place] at [phone] If you are running late, please call  [facility] at [phone]                    Access Code: [email or web address] URL: [email or web address] Date: 10/02/2020 Prepared by: Isabelle Course  Exercises Supine Quad Set - 1 x daily - 7 x weekly - 3 sets - 10 reps - 3-5 sec hold Supine Heel Slides - 1 x daily - 7 x weekly - 3 sets - 10 reps - 3-5 sec hold

## 2020-10-02 NOTE — Therapy (Signed)
[place], [place], [address] Phone: [phone]   Fax:  [phone]  Physical Therapy Evaluation  Patient Details  Name: Rachel Carr MRN: [id number] Date of Birth: 08/19/1950 Referring Provider (PT): Frederik Pear   Encounter Date: 10/02/2020   PT End of Session - 10/02/20 1142    Visit Number 1    Number of Visits 12    Date for PT Re-Evaluation 11/13/20    Authorization Type Aetna Medicare    Authorization - Visit Number 1    Authorization - Number of Visits 12    Progress Note Due on Visit 10    PT Start Time 1100    PT Stop Time 1140    PT Time Calculation (min) 40 min    Activity Tolerance Patient limited by pain    Behavior During Therapy [facility] for tasks assessed/performed           Past Medical History:  Diagnosis Date  . Barrett's syndrome   . CAD (coronary artery disease)   . Esophageal stricture   . Esophageal ulcer 09-2010   gastritis Dr. Glennon Hamilton  . Gastric polyps    colonoscopy 2012(due again for repeat 2017)  . Shingles    on the right eye    Past Surgical History:  Procedure Laterality Date  . CARPAL TUNNEL RELEASE    . CHOLECYSTECTOMY    . COMPLETE MASTECTOMY W/ SENTINEL NODE BIOPSY    . NISSEN FUNDOPLICATION  4193  . RADIOFREQUENCY ABLATION    . RT knee surgery    . TOTAL ABDOMINAL HYSTERECTOMY W/ BILATERAL SALPINGOOPHORECTOMY     endometriosis    There were no vitals filed for this visit.    Subjective Assessment - 10/02/20 1106    Subjective Pt had a Lt TKR 09/20/20. Pt has been recieving HHPT and is now ready to transition to outpatient. Pt still having increased Lt knee pain that increases with use, eases with elevation and rest.    Pertinent History Rt TKR 11/21    How long can you stand comfortably? 15-20 minutes    How long can you walk comfortably? 15 minutes    Patient Stated Goals get back to gardening, reduce pain    Currently in Pain? Yes     Pain Score 7     Pain Location Knee    Pain Orientation Left    Pain Descriptors / Indicators Aching;Sore    Pain Type Chronic pain    Pain Onset More than a month ago    Aggravating Factors  increased use    Pain Relieving Factors elevation              [facility] PT Assessment - 10/02/20 0001      Assessment   Medical Diagnosis s/p Lt TKR    Referring Provider (PT) Frederik Pear    Onset Date/Surgical Date 09/20/20    Prior Therapy HHPT      Balance Screen   Has the patient fallen in the past 6 months No      [place] residence    Home Access Stairs to enter    Entrance Stairs-Rails Right      Observation/Other Assessments   Focus on Therapeutic Outcomes (FOTO)  40 functional status measure      Observation/Other Assessments-Edema    Edema Circumferential      Circumferential Edema   Circumferential - Right 47 cm    Circumferential -  Left  47.5 cm      AROM   Right/Left Knee Left    Left Knee Extension 9    Left Knee Flexion 100      PROM   Right Knee Extension 7   pain   Right Knee Flexion 101   pain     Strength   Right Hip Flexion 5/5    Left Hip Flexion 3/5    Right Knee Flexion 4+/5    Right Knee Extension 5/5    Left Knee Flexion 3+/5    Left Knee Extension 4-/5      Special Tests   Other special tests --      Ambulation/Gait   Assistive device Straight cane    Gait Pattern Step-to pattern;Antalgic    Gait Comments decreased cadence                      Objective measurements completed on examination: See above findings.       [facility] Adult PT Treatment/Exercise - 10/02/20 0001      Knee/Hip Exercises: Supine   Quad Sets Strengthening;Left;2 sets;5 reps    Heel Slides Strengthening;AROM;Left;2 sets;5 reps      Vasopneumatic   Number Minutes Vasopneumatic  10 minutes    Vasopnuematic Location  Knee    Vasopneumatic Pressure Low    Vasopneumatic Temperature  34                     PT Short Term Goals - 07/26/20 1203      PT SHORT TERM GOAL #1   Title Patient will be compliant to HEP updates provided    Time 1    Period Weeks    Status On-going    Target Date 07/17/20             PT Long Term Goals - 10/02/20 1143      PT LONG TERM GOAL #1   Title Pt will be independent with HEP    Time 6    Period Weeks    Status New    Target Date 11/13/20      PT LONG TERM GOAL #2   Title Pt will improve FOTO to >= 74 to demo improved functional mobility    Time 6    Period Weeks    Status New    Target Date 11/13/20      PT LONG TERM GOAL #3   Title Pt will improve Lt LE strength to 4+/5 to perform gait and mobility with decreased pain    Time 6    Period Weeks    Status New    Target Date 11/13/20      PT LONG TERM GOAL #4   Title Pt will perform gait with LRAD with normal gait pattern to reduce pain    Time 6    Period Weeks    Status New    Target Date 11/13/20      PT LONG TERM GOAL #5   Title Pt will improve LT knee ROM to 2-110 to match Rt knee    Time 6    Period Weeks    Status New    Target Date 11/13/20                  Plan - 10/02/20 1130    Clinical Impression Statement Pt presents with decreased ROM, decreased strength and increased pain in Lt knee. Gait impairments and decreased balance. Pt  will benefit from skilled PT to address deficits and improve functional independence    Personal Factors and Comorbidities Age;Fitness;Comorbidity 3+;Time since onset of injury/illness/exacerbation    Comorbidities CAD, OA, obesity    Examination-Activity Limitations Bed Mobility;Locomotion Level;Transfers;Carry;Sit;Squat;Dressing;Stairs;Stand    Examination-Participation Restrictions Church;Cleaning;Community Activity;Driving;Shop    Stability/Clinical Decision Making Stable/Uncomplicated    Clinical Decision Making Low    Rehab Potential Good    PT Frequency 2x / week    PT Duration 6 weeks    PT  Treatment/Interventions ADLs/Self Care Home Management;Cryotherapy;DME Instruction;Gait training;Stair training;Functional mobility training;Therapeutic activities;Therapeutic exercise;Balance training;Neuromuscular re-education;Patient/family education;Manual techniques;Compression bandaging;Scar mobilization;Passive range of motion;Dry needling;Energy conservation;Taping;Vasopneumatic Device;Joint Manipulations;Aquatic Therapy;Moist Heat;Electrical Stimulation    PT Next Visit Plan assess HEP, progress to wt bearing exercise as tolerated, manual and modalities as indicated    PT Home Exercise Plan Access Code: [email or web address]    Consulted and Agree with Plan of Care Patient           Patient will benefit from skilled therapeutic intervention in order to improve the following deficits and impairments:  Abnormal gait,Decreased range of motion,Difficulty walking,Decreased activity tolerance,Pain,Decreased balance,Decreased knowledge of use of DME,Decreased scar mobility,Hypomobility,Impaired flexibility,Decreased mobility,Decreased strength,Increased edema  Visit Diagnosis: Other abnormalities of gait and mobility - Plan: PT plan of care cert/re-cert  Muscle weakness (generalized) - Plan: PT plan of care cert/re-cert  Acute pain of left knee - Plan: PT plan of care cert/re-cert     Problem List Patient Active Problem List   Diagnosis Date Noted  . Preop general physical exam 06/12/2020  . Bilateral primary osteoarthritis of knee 11/26/2016  . CAD S/P percutaneous coronary angioplasty 09/29/2016  . Unstable angina ([place]) 02/27/2016  . IFG (impaired fasting glucose) 11/20/2015  . 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 Regurgitation 12/10/2011  . Obese 10/14/2011  . Barrett's esophagus 01/13/2011  . Tobacco use disorder 01/02/2011  . COLONIC POLYPS 10/10/2010  . ULCER OF ESOPHAGUS WITHOUT BLEEDING 10/10/2010  . GERD 08/15/2010  . DIAPHRAGMAT HERN  W/O MENTION OBSTRUCTION/GANGREN 08/12/2010  . 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

## 2020-10-04 ENCOUNTER — Ambulatory Visit (INDEPENDENT_AMBULATORY_CARE_PROVIDER_SITE_OTHER): Payer: Medicare HMO | Admitting: Physical Therapy

## 2020-10-04 ENCOUNTER — Encounter: Payer: Self-pay | Admitting: Physical Therapy

## 2020-10-04 ENCOUNTER — Other Ambulatory Visit: Payer: Self-pay

## 2020-10-04 DIAGNOSIS — M6281 Muscle weakness (generalized): Secondary | ICD-10-CM

## 2020-10-04 DIAGNOSIS — R2689 Other abnormalities of gait and mobility: Secondary | ICD-10-CM | POA: Diagnosis not present

## 2020-10-04 DIAGNOSIS — M25562 Pain in left knee: Secondary | ICD-10-CM

## 2020-10-04 NOTE — Therapy (Signed)
[place], [place], [address] Phone: [phone]   Fax:  [phone]  Physical Therapy Treatment  Patient Details  Name: Rachel Carr MRN: [id number] Date of Birth: 1951/04/10 Referring Provider (PT): Frederik Pear   Encounter Date: 10/04/2020   PT End of Session - 10/04/20 0928    Visit Number 2    Number of Visits 12    Date for PT Re-Evaluation 11/13/20    Authorization Type [employer]    Authorization - Visit Number 2    Authorization - Number of Visits 12    Progress Note Due on Visit 10    PT Start Time 0929    PT Stop Time 1011    PT Time Calculation (min) 42 min    Activity Tolerance Patient tolerated treatment well    Behavior During Therapy [facility] for tasks assessed/performed           Past Medical History:  Diagnosis Date  . Barrett's syndrome   . CAD (coronary artery disease)   . Esophageal stricture   . Esophageal ulcer 09-2010   gastritis Dr. Glennon Hamilton  . Gastric polyps    colonoscopy 2012(due again for repeat 2017)  . Shingles    on the right eye    Past Surgical History:  Procedure Laterality Date  . CARPAL TUNNEL RELEASE    . CHOLECYSTECTOMY    . COMPLETE MASTECTOMY W/ SENTINEL NODE BIOPSY    . NISSEN FUNDOPLICATION  8101  . RADIOFREQUENCY ABLATION    . RT knee surgery    . TOTAL ABDOMINAL HYSTERECTOMY W/ BILATERAL SALPINGOOPHORECTOMY     endometriosis    There were no vitals filed for this visit.   Subjective Assessment - 10/04/20 0929    Subjective Pt reports that the pain in her Lt knee "is not nearly as painful as it has been.  I got to sleep 8 hours in a row for the first time".  Main complaint is stiffness/tightness. Now only taking pain medicine at night, to help her sleep.    Patient Stated Goals get back to gardening, reduce pain    Currently in Pain? Yes    Pain Score 5     Pain Location Knee    Pain Orientation Left    Pain Descriptors /  Indicators Aching;Tightness    Aggravating Factors  at night, with leg straight.    Pain Relieving Factors moving it.              [facility] PT Assessment - 10/04/20 0001      Assessment   Medical Diagnosis s/p Lt TKR    Referring Provider (PT) Frederik Pear    Onset Date/Surgical Date 09/20/20    Next MD Visit 10/29/20    Prior Therapy [facility]      ROM / Strength   AROM / PROM / Strength PROM;     PROM   Left Knee Flexion 110      PROM   Right Knee Flexion 112            [facility] Adult PT Treatment/Exercise - 10/04/20 0001      Ambulation/Gait   Ambulation Distance (Feet) 110 Feet    Assistive device None    Gait Pattern Step-through pattern;Decreased stance time - left;Lateral trunk lean to right;Decreased hip/knee flexion - left    Pre-Gait Activities marching to increase Lt hip flexion    Gait Comments tactile cues for reciprocal arm swing and rhythm for  equal stance time.      Knee/Hip Exercises: Stretches   Passive Hamstring Stretch Left;60 seconds;Right;30 seconds   long sitting, opp foot on ground   Knee: Self-Stretch Limitations seated scoots x 5 reps of 10 sec, 1 rep of heel slide with strap assist each LE    Gastroc Stretch Both;2 reps;20 seconds   incline board.     Knee/Hip Exercises: Aerobic   Nustep L3-4: 6.5 min for warm up and ROM. (legs only)      Knee/Hip Exercises: Standing   Other Standing Knee Exercises side stepping at counter x 10 ft Rt/Lt x 2    Other Standing Knee Exercises reciprocal pattern up 6" steps and retro step down x 12 steps with BUE on rail.      Vasopneumatic   Number Minutes Vasopneumatic  8 minutes    Vasopnuematic Location  Knee   Lt   Vasopneumatic Pressure Low    Vasopneumatic Temperature  34                       PT Long Term Goals - 10/04/20 1010      PT LONG TERM GOAL #1   Title Pt will be independent with HEP    Time 6    Period Weeks    Status New      PT LONG TERM GOAL #2   Title Pt will improve FOTO  to >= 74 to demo improved functional mobility    Time 6    Period Weeks    Status New      PT LONG TERM GOAL #3   Title Pt will improve Lt LE strength to 4+/5 to perform gait and mobility with decreased pain    Time 6    Period Weeks    Status New      PT LONG TERM GOAL #4   Title Pt will perform gait with LRAD with normal gait pattern to reduce pain    Time 6    Period Weeks    Status New      PT LONG TERM GOAL #5   Title Pt will improve Lt knee ROM to 2-110 to match Rt knee    Time 6    Period Weeks    Status Partially Met                 Plan - 10/04/20 1004    Clinical Impression Statement Much improved knee ROM (flexion) this visit and pain level.  Pt tolerated all exercises well, with minimal increase in pain.  Gait pattern slightly improved with repetition of cues and increased distance. Pt only able to tolerate 8 min of vaso; likely discomfort caused by leg propped into extension.  Progressing towards goals.    Personal Factors and Comorbidities Age;Fitness;Comorbidity 3+;Time since onset of injury/illness/exacerbation    Comorbidities CAD, OA, obesity    Examination-Activity Limitations Bed Mobility;Locomotion Level;Transfers;Carry;Sit;Squat;Dressing;Stairs;Stand    Examination-Participation Restrictions Church;Cleaning;Community Activity;Driving;Shop    Stability/Clinical Decision Making Stable/Uncomplicated    Rehab Potential Good    PT Frequency 2x / week    PT Duration 6 weeks    PT Treatment/Interventions ADLs/Self Care Home Management;Cryotherapy;DME Instruction;Gait training;Stair training;Functional mobility training;Therapeutic activities;Therapeutic exercise;Balance training;Neuromuscular re-education;Patient/family education;Manual techniques;Compression bandaging;Scar mobilization;Passive range of motion;Dry needling;Energy conservation;Taping;Vasopneumatic Device;Joint Manipulations;Aquatic Therapy;Moist Heat;Electrical Stimulation    PT Next Visit  Plan assess HEP, progress to wt bearing exercise as tolerated, manual and modalities as indicated    PT Home Exercise Plan Access  Code: [email or web address]    Consulted and Agree with Plan of Care Patient           Patient will benefit from skilled therapeutic intervention in order to improve the following deficits and impairments:  Abnormal gait,Decreased range of motion,Difficulty walking,Decreased activity tolerance,Pain,Decreased balance,Decreased knowledge of use of DME,Decreased scar mobility,Hypomobility,Impaired flexibility,Decreased mobility,Decreased strength,Increased edema  Visit Diagnosis: Acute pain of left knee  Muscle weakness (generalized)  Other abnormalities of gait and mobility     Problem List Patient Active Problem List   Diagnosis Date Noted  . Preop general physical exam 06/12/2020  . Bilateral primary osteoarthritis of knee 11/26/2016  . CAD S/P percutaneous coronary angioplasty 09/29/2016  . Unstable angina ([place]) 02/27/2016  . IFG (impaired fasting glucose) 11/20/2015  . 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 Regurgitation 12/10/2011  . Obese 10/14/2011  . Barrett's esophagus 01/13/2011  . Tobacco use disorder 01/02/2011  . COLONIC POLYPS 10/10/2010  . ULCER OF ESOPHAGUS WITHOUT BLEEDING 10/10/2010  . GERD 08/15/2010  . 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

## 2020-10-07 ENCOUNTER — Encounter: Payer: Self-pay | Admitting: Physical Therapy

## 2020-10-07 ENCOUNTER — Other Ambulatory Visit: Payer: Self-pay

## 2020-10-07 ENCOUNTER — Ambulatory Visit (INDEPENDENT_AMBULATORY_CARE_PROVIDER_SITE_OTHER): Payer: Medicare HMO | Admitting: Physical Therapy

## 2020-10-07 DIAGNOSIS — M25562 Pain in left knee: Secondary | ICD-10-CM | POA: Diagnosis not present

## 2020-10-07 DIAGNOSIS — R2689 Other abnormalities of gait and mobility: Secondary | ICD-10-CM

## 2020-10-07 DIAGNOSIS — M6281 Muscle weakness (generalized): Secondary | ICD-10-CM | POA: Diagnosis not present

## 2020-10-07 NOTE — Therapy (Signed)
[place], [place], [address] Phone: [phone]   Fax:  [phone]  Physical Therapy Treatment  Patient Details  Name: Rachel Carr MRN: [id number] Date of Birth: [birth year] Referring Provider (PT): Frederik Pear   Encounter Date: 10/07/2020   PT End of Session - 10/07/20 0936    Visit Number 3    Number of Visits 12    Date for PT Re-Evaluation 11/13/20    Authorization Type [employer] Medicare    Authorization - Visit Number 3    Authorization - Number of Visits 12    Progress Note Due on Visit 10    PT Start Time 0932    PT Stop Time 1010    PT Time Calculation (min) 38 min    Activity Tolerance Patient tolerated treatment well    Behavior During Therapy [facility] for tasks assessed/performed           Past Medical History:  Diagnosis Date  . Barrett's syndrome   . CAD (coronary artery disease)   . Esophageal stricture   . Esophageal ulcer 09-2010   gastritis Dr. Glennon Hamilton  . Gastric polyps    colonoscopy 2012(due again for repeat 2017)  . Shingles    on the right eye    Past Surgical History:  Procedure Laterality Date  . CARPAL TUNNEL RELEASE    . CHOLECYSTECTOMY    . COMPLETE MASTECTOMY W/ SENTINEL NODE BIOPSY    . NISSEN FUNDOPLICATION  6962  . RADIOFREQUENCY ABLATION    . RT knee surgery    . TOTAL ABDOMINAL HYSTERECTOMY W/ BILATERAL SALPINGOOPHORECTOMY     endometriosis    There were no vitals filed for this visit.   Subjective Assessment - 10/07/20 0936    Subjective Pt reports she has been trying to work on extension of her Lt knee, "I've been trying but I don't think I'm making much headway".    Pertinent History Rt TKR 11/21    Currently in Pain? Yes    Pain Score 3     Pain Location Knee    Pain Orientation Left;Lateral;Medial   thigh area on each side.   Pain Descriptors / Indicators Aching;Sore    Aggravating Factors  ?    Pain Relieving Factors moving LE               [facility] PT Assessment - 10/07/20 0001      Assessment   Medical Diagnosis s/p Lt TKR    Referring Provider (PT) Frederik Pear    Onset Date/Surgical Date 09/20/20    Next MD Visit 10/29/20    Prior Therapy [facility]      Flexibility   Soft Tissue Assessment /Muscle Length yes    Quadriceps Lt knee flexion-82 deg in prone stretch             [facility] Adult PT Treatment/Exercise - 10/07/20 0001      Knee/Hip Exercises: Stretches   Passive Hamstring Stretch Left;30 seconds;3 reps   supine with strap   Quad Stretch Left;20 seconds;3 reps   prone with strap   Gastroc Stretch Both;2 reps;20 seconds   incline board. L2.     Knee/Hip Exercises: Aerobic   Nustep L4: 5 min for warm up and ROM. (legs only)    Other Aerobic single laps around gym in between exercises to decrease stiffness and assess response to exercises.      Knee/Hip Exercises: Standing   Terminal Knee Extension Strengthening;Left;1 set;10 reps;Theraband  5 sec hold   Theraband Level (Terminal Knee Extension) Level 3 (Green)    Step Down Right;1 set;10 reps;Step Height: 4";Hand Hold: 2   retro step up with LLE   Other Standing Knee Exercises side stepping over 3-6" obstacle with LLE for increased hip/knee flexion, along with SLS dynamic balance x10, repeated with RLE. repeated in forward/ retro stepping over 3" obstacle for stepping strategy.    Other Standing Knee Exercises tandem stance 15 sec x 2 reps each leg in front.      Vasopneumatic   Number Minutes Vasopneumatic  --   pt declined     Manual Therapy   Manual Therapy Soft tissue mobilization    Soft tissue mobilization STM to Lt hamstring and calf to decrease fascial restrictions and improve ROM.                       PT Long Term Goals - 10/04/20 1010      PT LONG TERM GOAL #1   Title Pt will be independent with HEP    Time 6    Period Weeks    Status New      PT LONG TERM GOAL #2   Title Pt will improve FOTO to >= 74 to demo improved  functional mobility    Time 6    Period Weeks    Status New      PT LONG TERM GOAL #3   Title Pt will improve Lt LE strength to 4+/5 to perform gait and mobility with decreased pain    Time 6    Period Weeks    Status New      PT LONG TERM GOAL #4   Title Pt will perform gait with LRAD with normal gait pattern to reduce pain    Time 6    Period Weeks    Status New      PT LONG TERM GOAL #5   Title Pt will improve Lt knee ROM to 2-110 to match Rt knee    Time 6    Period Weeks    Status Partially Met                 Plan - 10/07/20 0955    Clinical Impression Statement Pt had some discomfort in Lt knee with tandem stance (knee in full ext), and end reps of TKE.  Otherwise pt tolerated other exercises well. Pt declined Vaso, will ice at home afterwards.  Gait quality gradually changing with cues to avoid Lt circumduction and increase Lt hip flexion.  Pt making good progress towards goals.    Personal Factors and Comorbidities Age;Fitness;Comorbidity 3+;Time since onset of injury/illness/exacerbation    Comorbidities CAD, OA, obesity    Examination-Activity Limitations Bed Mobility;Locomotion Level;Transfers;Carry;Sit;Squat;Dressing;Stairs;Stand    Examination-Participation Restrictions Church;Cleaning;Community Activity;Driving;Shop    Stability/Clinical Decision Making Stable/Uncomplicated    Rehab Potential Good    PT Frequency 2x / week    PT Duration 6 weeks    PT Treatment/Interventions ADLs/Self Care Home Management;Cryotherapy;DME Instruction;Gait training;Stair training;Functional mobility training;Therapeutic activities;Therapeutic exercise;Balance training;Neuromuscular re-education;Patient/family education;Manual techniques;Compression bandaging;Scar mobilization;Passive range of motion;Dry needling;Energy conservation;Taping;Vasopneumatic Device;Joint Manipulations;Aquatic Therapy;Moist Heat;Electrical Stimulation    PT Next Visit Plan assess HEP, progress to wt  bearing exercise as tolerated, manual and modalities as indicated    PT Home Exercise Plan Access Code: [email or web address]    Consulted and Agree with Plan of Care Patient           Patient will benefit  from skilled therapeutic intervention in order to improve the following deficits and impairments:  Abnormal gait,Decreased range of motion,Difficulty walking,Decreased activity tolerance,Pain,Decreased balance,Decreased knowledge of use of DME,Decreased scar mobility,Hypomobility,Impaired flexibility,Decreased mobility,Decreased strength,Increased edema  Visit Diagnosis: Acute pain of left knee  Muscle weakness (generalized)  Other abnormalities of gait and mobility     Problem List Patient Active Problem List   Diagnosis Date Noted  . Preop general physical exam 06/12/2020  . Bilateral primary osteoarthritis of knee 11/26/2016  . CAD S/P percutaneous coronary angioplasty 09/29/2016  . Unstable angina ([place]) 02/27/2016  . IFG (impaired fasting glucose) 11/20/2015  . 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 Regurgitation 12/10/2011  . Obese 10/14/2011  . Barrett's esophagus 01/13/2011  . Tobacco use disorder 01/02/2011  . COLONIC POLYPS 10/10/2010  . ULCER OF ESOPHAGUS WITHOUT BLEEDING 10/10/2010  . GERD 08/15/2010  . 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

## 2020-10-10 ENCOUNTER — Ambulatory Visit (INDEPENDENT_AMBULATORY_CARE_PROVIDER_SITE_OTHER): Payer: Medicare HMO | Admitting: Physical Therapy

## 2020-10-10 ENCOUNTER — Other Ambulatory Visit: Payer: Self-pay

## 2020-10-10 ENCOUNTER — Encounter: Payer: Self-pay | Admitting: Physical Therapy

## 2020-10-10 DIAGNOSIS — M6281 Muscle weakness (generalized): Secondary | ICD-10-CM

## 2020-10-10 DIAGNOSIS — M25562 Pain in left knee: Secondary | ICD-10-CM

## 2020-10-10 DIAGNOSIS — R2689 Other abnormalities of gait and mobility: Secondary | ICD-10-CM

## 2020-10-10 NOTE — Therapy (Signed)
[place], [place], [address] Phone: [phone]   Fax:  [phone]  Physical Therapy Treatment  Patient Details  Name: Rachel Carr MRN: [id number] Date of Birth: [birth year] Referring Provider (PT): Frederik Pear   Encounter Date: 10/10/2020   PT End of Session - 10/10/20 1405    Visit Number 4    Number of Visits 12    Date for PT Re-Evaluation 11/13/20    Authorization Type [employer] Medicare    Authorization - Visit Number 4    Authorization - Number of Visits 12    Progress Note Due on Visit 10    PT Start Time 1401    PT Stop Time 1443    PT Time Calculation (min) 42 min    Activity Tolerance Patient tolerated treatment well    Behavior During Therapy [facility] for tasks assessed/performed           Past Medical History:  Diagnosis Date  . Barrett's syndrome   . CAD (coronary artery disease)   . Esophageal stricture   . Esophageal ulcer 09-2010   gastritis Dr. Glennon Hamilton  . Gastric polyps    colonoscopy 2012(due again for repeat 2017)  . Shingles    on the right eye    Past Surgical History:  Procedure Laterality Date  . CARPAL TUNNEL RELEASE    . CHOLECYSTECTOMY    . COMPLETE MASTECTOMY W/ SENTINEL NODE BIOPSY    . NISSEN FUNDOPLICATION  4010  . RADIOFREQUENCY ABLATION    . RT knee surgery    . TOTAL ABDOMINAL HYSTERECTOMY W/ BILATERAL SALPINGOOPHORECTOMY     endometriosis    There were no vitals filed for this visit.   Subjective Assessment - 10/10/20 1401    Subjective "I tried to work out in garden yesterday for 1/2 hr yesterday and the back of my knee swelled up".    Patient Stated Goals get back to gardening, reduce pain    Currently in Pain? Yes    Pain Score 3     Pain Location Knee    Pain Orientation Left;Posterior    Pain Descriptors / Indicators Nagging    Aggravating Factors  ?    Pain Relieving Factors moving LE, ice              [facility] PT Assessment -  10/10/20 0001      Assessment   Medical Diagnosis s/p Lt TKR    Referring Provider (PT) Frederik Pear    Onset Date/Surgical Date 09/20/20    Next MD Visit 10/29/20    Prior Therapy [facility]PT             [facility] Adult PT Treatment/Exercise - 10/10/20 0001      Knee/Hip Exercises: Stretches   Passive Hamstring Stretch Left;30 seconds;3 reps   single leg long sitting   Quad Stretch Left;20 seconds;3 reps   prone with strap   Piriformis Stretch Left;Right;1 rep;30 seconds   supine, after hip abdct   Gastroc Stretch Right;Left;2 reps;20 seconds   runners stretch     Knee/Hip Exercises: Aerobic   Recumbent Bike slow partial to full revolutions x 5 min    Other Aerobic single laps around gym in between exercises to decrease stiffness and assess response to exercises.      Knee/Hip Exercises: Seated   Sit to Sand 10 reps;without UE support      Knee/Hip Exercises: Supine   Bridges 1 set;10 reps  Knee/Hip Exercises: Sidelying   Hip ABduction Strengthening;Left;2 sets;10 reps      Knee/Hip Exercises: Prone   Hip Extension Strengthening;Left;Right;1 set;10 reps    Other Prone Exercises LT TKE quad set x 5 reps of 10 sec      Vasopneumatic   Number Minutes Vasopneumatic  --   declined.     Manual Therapy   Manual Therapy Taping    Kinesiotex Create Space;Edema      Kinesiotix   Edema I strip of reg Rock tape applied to medial Lt knee (pes anserine and below) to decompress tissue.    Create Space I strip over Lt knee incision with zig zag pttern for scar managmeent.                  PT Education - 10/10/20 1440    Education Details HEP    Person(s) Educated Patient    Methods Explanation;Handout;Demonstration    Comprehension Verbalized understanding;Returned demonstration               PT Long Term Goals - 10/04/20 1010      PT LONG TERM GOAL #1   Title Pt will be independent with HEP    Time 6    Period Weeks    Status New      PT LONG TERM GOAL #2    Title Pt will improve FOTO to >= 74 to demo improved functional mobility    Time 6    Period Weeks    Status New      PT LONG TERM GOAL #3   Title Pt will improve Lt LE strength to 4+/5 to perform gait and mobility with decreased pain    Time 6    Period Weeks    Status New      PT LONG TERM GOAL #4   Title Pt will perform gait with LRAD with normal gait pattern to reduce pain    Time 6    Period Weeks    Status New      PT LONG TERM GOAL #5   Title Pt will improve Lt knee ROM to 2-110 to match Rt knee    Time 6    Period Weeks    Status Partially Met                 Plan - 10/10/20 1442    Clinical Impression Statement Pt tolerated new strengthening exercises for LE well, without pain just fatigue.  Pt making good progress towards goals.    Personal Factors and Comorbidities Age;Fitness;Comorbidity 3+;Time since onset of injury/illness/exacerbation    Comorbidities CAD, OA, obesity    Examination-Activity Limitations Bed Mobility;Locomotion Level;Transfers;Carry;Sit;Squat;Dressing;Stairs;Stand    Examination-Participation Restrictions Church;Cleaning;Community Activity;Driving;Shop    Stability/Clinical Decision Making Stable/Uncomplicated    Rehab Potential Good    PT Frequency 2x / week    PT Duration 6 weeks    PT Treatment/Interventions ADLs/Self Care Home Management;Cryotherapy;DME Instruction;Gait training;Stair training;Functional mobility training;Therapeutic activities;Therapeutic exercise;Balance training;Neuromuscular re-education;Patient/family education;Manual techniques;Compression bandaging;Scar mobilization;Passive range of motion;Dry needling;Energy conservation;Taping;Vasopneumatic Device;Joint Manipulations;Aquatic Therapy;Moist Heat;Electrical Stimulation    PT Next Visit Plan progress to wt bearing exercise as tolerated, manual and modalities as indicated.  Add 8-10" step ups (to assist her being able to step on stool at home for chores). Assess  response to tape application    PT Home Exercise Plan Access Code: [email or web address]    Consulted and Agree with Plan of Care Patient             Patient will benefit from skilled therapeutic intervention in order to improve the following deficits and impairments:  Abnormal gait,Decreased range of motion,Difficulty walking,Decreased activity tolerance,Pain,Decreased balance,Decreased knowledge of use of DME,Decreased scar mobility,Hypomobility,Impaired flexibility,Decreased mobility,Decreased strength,Increased edema  Visit Diagnosis: Acute pain of left knee  Muscle weakness (generalized)  Other abnormalities of gait and mobility     Problem List Patient Active Problem List   Diagnosis Date Noted  . Preop general physical exam 06/12/2020  . Bilateral primary osteoarthritis of knee 11/26/2016  . CAD S/P percutaneous coronary angioplasty 09/29/2016  . Unstable angina ([facility]) 02/27/2016  . IFG (impaired fasting glucose) 11/20/2015  . 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 Regurgitation 12/10/2011  . Obese 10/14/2011  . Barrett's esophagus 01/13/2011  . Tobacco use disorder 01/02/2011  . COLONIC POLYPS 10/10/2010  . ULCER OF ESOPHAGUS WITHOUT BLEEDING 10/10/2010  . GERD 08/15/2010  . 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   

## 2020-10-10 NOTE — Patient Instructions (Signed)
Access Code: [email or web address] URL: [email or web address] Date: 10/10/2020 Prepared by: [place]  Exercises Gastroc Stretch on Wall - 1 x daily - 7 x weekly - 2-3 reps - 20 seconds hold Prone Quadriceps Set - 1 x daily - 7 x weekly - 1 sets - 5 reps - 10 seconds hold Prone Hip Extension - One Pillow - 1 x daily - 3 x weekly - 2 sets - 10 reps Prone Quadriceps Stretch with Strap - 1-2 x daily - 7 x weekly - 3 reps - 30 seconds hold Sidelying Hip Abduction - 1 x daily - 3 x weekly - 2 sets - 10 reps Supine Piriformis Stretch with Foot on Ground - 1- x daily - 7 x weekly - 2-3 reps - 20-30 seconds hold

## 2020-10-15 ENCOUNTER — Other Ambulatory Visit: Payer: Self-pay

## 2020-10-15 ENCOUNTER — Ambulatory Visit (INDEPENDENT_AMBULATORY_CARE_PROVIDER_SITE_OTHER): Payer: Medicare HMO | Admitting: Physical Therapy

## 2020-10-15 DIAGNOSIS — M25562 Pain in left knee: Secondary | ICD-10-CM | POA: Diagnosis not present

## 2020-10-15 DIAGNOSIS — M6281 Muscle weakness (generalized): Secondary | ICD-10-CM | POA: Diagnosis not present

## 2020-10-15 DIAGNOSIS — R2689 Other abnormalities of gait and mobility: Secondary | ICD-10-CM | POA: Diagnosis not present

## 2020-10-15 NOTE — Therapy (Signed)
[place] [facility] [place], [place], [address] Phone: [phone]   Fax:  [phone]  Physical Therapy Treatment  Patient Details  Name: Rachel Carr MRN: [id number] Date of Birth: 12-04-1950 Referring Provider (PT): Frederik Pear   Encounter Date: 10/15/2020   PT End of Session - 10/15/20 1003    Visit Number 5    Number of Visits 12    Date for PT Re-Evaluation 11/13/20    Authorization Type [employer] Medicare    Authorization - Number of Visits 12    Progress Note Due on Visit 10    PT Start Time 0935    PT Stop Time 1000    PT Time Calculation (min) 25 min    Activity Tolerance Treatment limited secondary to medical complications (Comment)   nausea   Behavior During Therapy [facility] for tasks assessed/performed           Past Medical History:  Diagnosis Date  . Barrett's syndrome   . CAD (coronary artery disease)   . Esophageal stricture   . Esophageal ulcer 09-2010   gastritis Dr. Glennon Hamilton  . Gastric polyps    colonoscopy 2012(due again for repeat 2017)  . Shingles    on the right eye    Past Surgical History:  Procedure Laterality Date  . CARPAL TUNNEL RELEASE    . CHOLECYSTECTOMY    . COMPLETE MASTECTOMY W/ SENTINEL NODE BIOPSY    . NISSEN FUNDOPLICATION  3154  . RADIOFREQUENCY ABLATION    . RT knee surgery    . TOTAL ABDOMINAL HYSTERECTOMY W/ BILATERAL SALPINGOOPHORECTOMY     endometriosis    There were no vitals filed for this visit.   Subjective Assessment - 10/15/20 1030    Subjective Pt reports she hasn't felt well in the last 3-4 days.  She insisted on staying during session, hoping it will make her feel better.  She has not performed many exercises since she has "felt crummy".    Patient Stated Goals get back to gardening, reduce pain    Currently in Pain? Yes    Pain Score 6     Pain Location Knee    Pain Orientation Left;Lower;Lateral    Pain Descriptors / Indicators Sore     Aggravating Factors  ?    Pain Relieving Factors ?              [facility] PT Assessment - 10/15/20 0001      Assessment   Medical Diagnosis s/p Lt TKR    Referring Provider (PT) Frederik Pear    Onset Date/Surgical Date 09/20/20    Next MD Visit 10/29/20    Prior Therapy HHPT           [facility] Adult PT Treatment/Exercise - 10/15/20 0001      Knee/Hip Exercises: Aerobic   Recumbent Bike slow full revolutions on recumbent bike. for ROM      Knee/Hip Exercises: Standing   Forward Step Up Right;Left   2 reps each leg; increased Lt knee pain   Forward Step Up Limitations stopped and performed manual therapy.  Trial after manual therapy increased nausea.      Manual Therapy   Soft tissue mobilization IASTM to Lt distal lateral quad and hamstring to decrease fascial restrictions.      Kinesiotix   Create Space I strip over Lt knee incision with zig zag pttern for scar managmeent.  I strip of reg Rock tape applied to distal Lt ITB to  decompress tissue and increase proprioception.                       PT Long Term Goals - 10/04/20 1010      PT LONG TERM GOAL #1   Title Pt will be independent with HEP    Time 6    Period Weeks    Status New      PT LONG TERM GOAL #2   Title Pt will improve FOTO to >= 74 to demo improved functional mobility    Time 6    Period Weeks    Status New      PT LONG TERM GOAL #3   Title Pt will improve Lt LE strength to 4+/5 to perform gait and mobility with decreased pain    Time 6    Period Weeks    Status New      PT LONG TERM GOAL #4   Title Pt will perform gait with LRAD with normal gait pattern to reduce pain    Time 6    Period Weeks    Status New      PT LONG TERM GOAL #5   Title Pt will improve Lt knee ROM to 2-110 to match Rt knee    Time 6    Period Weeks    Status Partially Met                 Plan - 10/15/20 1028    Clinical Impression Statement Session ended early due to increase in nausea with exercise.   Some palpable tightness found in distal Lt biceps femoris and ITB.  Trial of tape to this area today.  Goals are ongoing.    Personal Factors and Comorbidities Age;Fitness;Comorbidity 3+;Time since onset of injury/illness/exacerbation    Comorbidities CAD, OA, obesity    Examination-Activity Limitations Bed Mobility;Locomotion Level;Transfers;Carry;Sit;Squat;Dressing;Stairs;Stand    Examination-Participation Restrictions Church;Cleaning;Community Activity;Driving;Shop    Stability/Clinical Decision Making Stable/Uncomplicated    Rehab Potential Good    PT Frequency 2x / week    PT Duration 6 weeks    PT Treatment/Interventions ADLs/Self Care Home Management;Cryotherapy;DME Instruction;Gait training;Stair training;Functional mobility training;Therapeutic activities;Therapeutic exercise;Balance training;Neuromuscular re-education;Patient/family education;Manual techniques;Compression bandaging;Scar mobilization;Passive range of motion;Dry needling;Energy conservation;Taping;Vasopneumatic Device;Joint Manipulations;Aquatic Therapy;Moist Heat;Electrical Stimulation    PT Next Visit Plan progress to wt bearing exercise as tolerated, manual and modalities as indicated.  Add 8-10" step ups (to assist her being able to step on stool at home for chores). assess response to tape.    PT Home Exercise Plan Access Code: [email or web address]    Consulted and Agree with Plan of Care Patient           Patient will benefit from skilled therapeutic intervention in order to improve the following deficits and impairments:  Abnormal gait,Decreased range of motion,Difficulty walking,Decreased activity tolerance,Pain,Decreased balance,Decreased knowledge of use of DME,Decreased scar mobility,Hypomobility,Impaired flexibility,Decreased mobility,Decreased strength,Increased edema  Visit Diagnosis: Acute pain of left knee  Muscle weakness (generalized)  Other abnormalities of gait and mobility     Problem List Patient  Active Problem List   Diagnosis Date Noted  . Preop general physical exam 06/12/2020  . Bilateral primary osteoarthritis of knee 11/26/2016  . CAD S/P percutaneous coronary angioplasty 09/29/2016  . Unstable angina ([place]) 02/27/2016  . IFG (impaired fasting glucose) 11/20/2015  . 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 Regurgitation 12/10/2011  . Obese 10/14/2011  . Barrett's esophagus 01/13/2011  . Tobacco use disorder 01/02/2011  .  COLONIC POLYPS 10/10/2010  . ULCER OF ESOPHAGUS WITHOUT BLEEDING 10/10/2010  . GERD 08/15/2010  . 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 [place], [place], [address] Phone: [phone]   Fax:  [phone]  Name: [place] MRN: [id number] Date of Birth: 1951/05/21

## 2020-10-18 ENCOUNTER — Encounter: Payer: Self-pay | Admitting: Physical Therapy

## 2020-10-18 ENCOUNTER — Other Ambulatory Visit: Payer: Self-pay

## 2020-10-18 ENCOUNTER — Ambulatory Visit (INDEPENDENT_AMBULATORY_CARE_PROVIDER_SITE_OTHER): Payer: Medicare HMO | Admitting: Physical Therapy

## 2020-10-18 DIAGNOSIS — R2689 Other abnormalities of gait and mobility: Secondary | ICD-10-CM

## 2020-10-18 DIAGNOSIS — M25562 Pain in left knee: Secondary | ICD-10-CM | POA: Diagnosis not present

## 2020-10-18 DIAGNOSIS — M6281 Muscle weakness (generalized): Secondary | ICD-10-CM | POA: Diagnosis not present

## 2020-10-18 NOTE — Therapy (Signed)
[place], [place], [address] Phone: [phone]   Fax:  [phone]  Physical Therapy Treatment  Patient Details  Name: Rachel Carr MRN: [id number] Date of Birth: August 07, 1951 Referring Provider (PT): Frederik Pear   Encounter Date: 10/18/2020   PT End of Session - 10/18/20 0933    Visit Number 6    Number of Visits 12    Date for PT Re-Evaluation 11/13/20    Authorization Type [employer]na Medicare    Authorization - Visit Number 6    Authorization - Number of Visits 12    Progress Note Due on Visit 10    PT Start Time 0930    PT Stop Time 1009    PT Time Calculation (min) 39 min    Activity Tolerance Patient limited by fatigue   nausea   Behavior During Therapy [facility] for tasks assessed/performed           Past Medical History:  Diagnosis Date  . Barrett's syndrome   . CAD (coronary artery disease)   . Esophageal stricture   . Esophageal ulcer 09-2010   gastritis Dr. Glennon Hamilton  . Gastric polyps    colonoscopy 2012(due again for repeat 2017)  . Shingles    on the right eye    Past Surgical History:  Procedure Laterality Date  . CARPAL TUNNEL RELEASE    . CHOLECYSTECTOMY    . COMPLETE MASTECTOMY W/ SENTINEL NODE BIOPSY    . NISSEN FUNDOPLICATION  5997  . RADIOFREQUENCY ABLATION    . RT knee surgery    . TOTAL ABDOMINAL HYSTERECTOMY W/ BILATERAL SALPINGOOPHORECTOMY     endometriosis    There were no vitals filed for this visit.   Subjective Assessment - 10/18/20 0934    Subjective Pt reports she has had some sort of stomach bug; "I finally stopped throwing up last night". "I just feel extremely weak".  Pt reports the tape only lasted until the night of last appt and did not provide much relief.    Pertinent History Rt TKR 11/21    Patient Stated Goals get back to gardening, reduce pain    Currently in Pain? Yes    Pain Score 3     Pain Location Knee    Pain Orientation Left;Lateral     Pain Descriptors / Indicators Tender;Aching    Aggravating Factors  ?    Pain Relieving Factors ice, massage              [facility] PT Assessment - 10/18/20 0001      Assessment   Medical Diagnosis s/p Lt TKR    Referring Provider (PT) Frederik Pear    Onset Date/Surgical Date 09/20/20    Next MD Visit 10/29/20    Prior Therapy HHPT      Strength   Left Hip Flexion 5/5    Left Knee Flexion 4+/5    Left Knee Extension 4+/5             [facility] Adult PT Treatment/Exercise - 10/18/20 0001      Ambulation/Gait   Ambulation Distance (Feet) 160 Feet    Assistive device None    Gait Pattern Step-through pattern;Lateral trunk lean to left;Decreased hip/knee flexion - left;Decreased stance time - left;Decreased hip/knee flexion - right;Decreased weight shift to left    Gait Comments cues to avoid Lt lean with Lt stance, and even wt shift.      Knee/Hip Exercises: Stretches   Best boy  Left;3 reps;Right;1 rep;30 seconds   hooklying with strap   Quad Stretch Left;3 reps;Right;30 seconds;2 reps   prone with strap   Piriformis Stretch Left;Right;2 reps;20 seconds   strap assist to bring knee towards opp shoulder.   Gastroc Stretch Both;1 rep;20 seconds   incline board, L3     Knee/Hip Exercises: Aerobic   Nustep L5: x 5 min for warm up      Knee/Hip Exercises: Standing   Heel Raises Both;1 set;10 reps    Terminal Knee Extension Strengthening;Left;1 set;10 reps   5 sec hold, pushing back of knee into ball on wall.   Wall Squat 1 set;10 reps;3 seconds      Knee/Hip Exercises: Supine   Bridges Strengthening;1 set;10 reps   4 sec hold   Straight Leg Raises Strengthening;Left;Right;1 set;5 reps      Knee/Hip Exercises: Sidelying   Hip ABduction Strengthening;Left;1 set;10 reps                       PT Long Term Goals - 10/18/20 0940      PT LONG TERM GOAL #1   Title Pt will be independent with HEP    Time 6    Period Weeks    Status On-going       PT LONG TERM GOAL #2   Title Pt will improve FOTO to >= 74 to demo improved functional mobility    Time 6    Period Weeks    Status On-going      PT LONG TERM GOAL #3   Title Pt will improve Lt LE strength to 4+/5 to perform gait and mobility with decreased pain    Time 6    Period Weeks    Status On-going      PT LONG TERM GOAL #4   Title Pt will perform gait with LRAD with normal gait pattern to reduce pain    Time 6    Period Weeks    Status On-going      PT LONG TERM GOAL #5   Title Pt will improve Lt knee ROM to 2-110 to match Rt knee    Time 6    Period Weeks    Status Partially Met                 Plan - 10/18/20 0959    Clinical Impression Statement Pt demonstrated improved LLE strength.  Due to pt reporting fatigue before/during session, pt given short seated/ supine rest breaks in between exercises. Pt tolerated all exercises without increase in pain.  Pt making good progress towards LTGs.    Personal Factors and Comorbidities Age;Fitness;Comorbidity 3+;Time since onset of injury/illness/exacerbation    Comorbidities CAD, OA, obesity    Examination-Activity Limitations Bed Mobility;Locomotion Level;Transfers;Carry;Sit;Squat;Dressing;Stairs;Stand    Examination-Participation Restrictions Church;Cleaning;Community Activity;Driving;Shop    Stability/Clinical Decision Making Stable/Uncomplicated    Rehab Potential Good    PT Frequency 2x / week    PT Duration 6 weeks    PT Treatment/Interventions ADLs/Self Care Home Management;Cryotherapy;DME Instruction;Gait training;Stair training;Functional mobility training;Therapeutic activities;Therapeutic exercise;Balance training;Neuromuscular re-education;Patient/family education;Manual techniques;Compression bandaging;Scar mobilization;Passive range of motion;Dry needling;Energy conservation;Taping;Vasopneumatic Device;Joint Manipulations;Aquatic Therapy;Moist Heat;Electrical Stimulation    PT Next Visit Plan progress to wt  bearing exercise as tolerated, manual and modalities as indicated.  Add 8-10" step ups (to assist her being able to step on stool at home for chores). assess response to tape.    PT Home Exercise Plan Access Code: [email or web address]    [email or web address]  and Agree with Plan of Care Patient           Patient will benefit from skilled therapeutic intervention in order to improve the following deficits and impairments:  Abnormal gait,Decreased range of motion,Difficulty walking,Decreased activity tolerance,Pain,Decreased balance,Decreased knowledge of use of DME,Decreased scar mobility,Hypomobility,Impaired flexibility,Decreased mobility,Decreased strength,Increased edema  Visit Diagnosis: Acute pain of left knee  Muscle weakness (generalized)  Other abnormalities of gait and mobility     Problem List Patient Active Problem List   Diagnosis Date Noted  . Preop general physical exam 06/12/2020  . Bilateral primary osteoarthritis of knee 11/26/2016  . CAD S/P percutaneous coronary angioplasty 09/29/2016  . Unstable angina ([place]) 02/27/2016  . IFG (impaired fasting glucose) 11/20/2015  . 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 Regurgitation 12/10/2011  . Obese 10/14/2011  . Barrett's esophagus 01/13/2011  . Tobacco use disorder 01/02/2011  . COLONIC POLYPS 10/10/2010  . ULCER OF ESOPHAGUS WITHOUT BLEEDING 10/10/2010  . GERD 08/15/2010  . 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

## 2020-10-22 ENCOUNTER — Other Ambulatory Visit: Payer: Self-pay

## 2020-10-22 ENCOUNTER — Ambulatory Visit (INDEPENDENT_AMBULATORY_CARE_PROVIDER_SITE_OTHER): Payer: Medicare HMO | Admitting: Physical Therapy

## 2020-10-22 ENCOUNTER — Encounter: Payer: Self-pay | Admitting: Physical Therapy

## 2020-10-22 DIAGNOSIS — R2689 Other abnormalities of gait and mobility: Secondary | ICD-10-CM

## 2020-10-22 DIAGNOSIS — M6281 Muscle weakness (generalized): Secondary | ICD-10-CM

## 2020-10-22 DIAGNOSIS — M25562 Pain in left knee: Secondary | ICD-10-CM

## 2020-10-22 NOTE — Therapy (Signed)
[place], [place], [address] Phone: [phone]   Fax:  [phone]  Physical Therapy Treatment  Patient Details  Name: Rachel Carr MRN: [id number] Date of Birth: [birth year] Referring Provider (PT): Frederik Pear   Encounter Date: 10/22/2020   PT End of Session - 10/22/20 0938    Visit Number 7    Number of Visits 12    Date for PT Re-Evaluation 11/13/20    Authorization Type [employer] Medicare    Authorization - Visit Number 7    Authorization - Number of Visits 12    Progress Note Due on Visit 10    PT Start Time 0931    PT Stop Time 1009    PT Time Calculation (min) 38 min    Activity Tolerance Patient limited by fatigue   nausea   Behavior During Therapy [facility] for tasks assessed/performed           Past Medical History:  Diagnosis Date  . Barrett's syndrome   . CAD (coronary artery disease)   . Esophageal stricture   . Esophageal ulcer 09-2010   gastritis Dr. Glennon Hamilton  . Gastric polyps    colonoscopy 2012(due again for repeat 2017)  . Shingles    on the right eye    Past Surgical History:  Procedure Laterality Date  . CARPAL TUNNEL RELEASE    . CHOLECYSTECTOMY    . COMPLETE MASTECTOMY W/ SENTINEL NODE BIOPSY    . NISSEN FUNDOPLICATION  0233  . RADIOFREQUENCY ABLATION    . RT knee surgery    . TOTAL ABDOMINAL HYSTERECTOMY W/ BILATERAL SALPINGOOPHORECTOMY     endometriosis    There were no vitals filed for this visit.   Subjective Assessment - 10/22/20 1023    Subjective "It's just so tight".  Pt reports persistant tightness in Lt thigh near knee. She has been trying to walk around once / hour.  Continued difficulty sleeping through the night.    Pertinent History Rt TKR 11/21    Patient Stated Goals get back to gardening, reduce pain    Currently in Pain? Yes    Pain Score 1     Pain Location Knee    Pain Orientation Left;Lateral    Pain Descriptors / Indicators [facility] PT Assessment - 10/22/20 0001      Assessment   Medical Diagnosis s/p Lt TKR    Referring Provider (PT) Frederik Pear    Onset Date/Surgical Date 09/20/20    Next MD Visit 10/29/20    Prior Therapy HHPT            [facility] Adult PT Treatment/Exercise - 10/22/20 0001      Lumbar Exercises: Stretches   Hip Flexor Stretch Left;3 reps;Right;2 reps;30 seconds   seated     Knee/Hip Exercises: Stretches   Gastroc Stretch Both;2 reps;30 seconds   L2 on incline board     Knee/Hip Exercises: Aerobic   Recumbent Bike slow full revolutions to L1 on recumbent bike. for ROM    Other Aerobic single laps around gym in between exercises to decrease stiffness and assess response to exercises.      Knee/Hip Exercises: Standing   Other Standing Knee Exercises toe taps to 10" step with light UE support on counter x 10 each leg. Stepping up and over 10" stool x 5 reps each leg, with light UE support on counter -  to strengthen hips    Other Standing Knee Exercises tandem gait x 15 ft with close SBA (painful in Lt knee)      Manual Therapy   Soft tissue mobilization STM to Lt lateral quad, hamstring, glutes to decrease fascial restrictions.  TPR with contract/relax to Lt TFL.                  PT Education - 10/22/20 1019    Education Details HEP- added seated hip flexor stretch    Person(s) Educated Patient    Methods Explanation;Demonstration;Handout;Verbal cues    Comprehension Verbalized understanding;Returned demonstration               PT Long Term Goals - 10/18/20 0940      PT LONG TERM GOAL #1   Title Pt will be independent with HEP    Time 6    Period Weeks    Status On-going      PT LONG TERM GOAL #2   Title Pt will improve FOTO to >= 74 to demo improved functional mobility    Time 6    Period Weeks    Status On-going      PT LONG TERM GOAL #3   Title Pt will improve Lt LE strength to 4+/5 to perform gait and mobility with decreased pain    Time  6    Period Weeks    Status On-going      PT LONG TERM GOAL #4   Title Pt will perform gait with LRAD with normal gait pattern to reduce pain    Time 6    Period Weeks    Status On-going      PT LONG TERM GOAL #5   Title Pt will improve Lt knee ROM to 2-110 to match Rt knee    Time 6    Period Weeks    Status Partially Met                 Plan - 10/22/20 1020    Clinical Impression Statement Improved exercise tolerance now that she is over stomach bug.  Continued complaint of tightness in Lt distal quad; improved with hip flexor/quad stretch and STM to area.  Pt observed to have Lt lateral lean with Lt stance and decreased hip flexion during swing through of gait.  Progressing well towards all goals.    Personal Factors and Comorbidities Age;Fitness;Comorbidity 3+;Time since onset of injury/illness/exacerbation    Comorbidities CAD, OA, obesity    Examination-Activity Limitations Bed Mobility;Locomotion Level;Transfers;Carry;Sit;Squat;Dressing;Stairs;Stand    Examination-Participation Restrictions Church;Cleaning;Community Activity;Driving;Shop    Stability/Clinical Decision Making Stable/Uncomplicated    Rehab Potential Good    PT Frequency 2x / week    PT Duration 6 weeks    PT Treatment/Interventions ADLs/Self Care Home Management;Cryotherapy;DME Instruction;Gait training;Stair training;Functional mobility training;Therapeutic activities;Therapeutic exercise;Balance training;Neuromuscular re-education;Patient/family education;Manual techniques;Compression bandaging;Scar mobilization;Passive range of motion;Dry needling;Energy conservation;Taping;Vasopneumatic Device;Joint Manipulations;Aquatic Therapy;Moist Heat;Electrical Stimulation    PT Next Visit Plan MD note; FOTO. Assess goals.    PT Home Exercise Plan Access Code: [email or web address]    Consulted and Agree with Plan of Care Patient           Patient will benefit from skilled therapeutic intervention in order to improve the  following deficits and impairments:  Abnormal gait,Decreased range of motion,Difficulty walking,Decreased activity tolerance,Pain,Decreased balance,Decreased knowledge of use of DME,Decreased scar mobility,Hypomobility,Impaired flexibility,Decreased mobility,Decreased strength,Increased edema  Visit Diagnosis: Acute pain of left knee  Muscle weakness (generalized)  Other abnormalities of gait and  mobility     Problem List Patient Active Problem List   Diagnosis Date Noted  . Preop general physical exam 06/12/2020  . Bilateral primary osteoarthritis of knee 11/26/2016  . CAD S/P percutaneous coronary angioplasty 09/29/2016  . Unstable angina ([place]) 02/27/2016  . IFG (impaired fasting glucose) 11/20/2015  . 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 Regurgitation 12/10/2011  . Obese 10/14/2011  . Barrett's esophagus 01/13/2011  . Tobacco use disorder 01/02/2011  . COLONIC POLYPS 10/10/2010  . ULCER OF ESOPHAGUS WITHOUT BLEEDING 10/10/2010  . GERD 08/15/2010  . 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

## 2020-10-25 ENCOUNTER — Encounter: Payer: Medicare HMO | Admitting: Physical Therapy

## 2020-10-28 ENCOUNTER — Other Ambulatory Visit: Payer: Self-pay

## 2020-10-28 ENCOUNTER — Ambulatory Visit (INDEPENDENT_AMBULATORY_CARE_PROVIDER_SITE_OTHER): Payer: Medicare HMO | Admitting: Physical Therapy

## 2020-10-28 DIAGNOSIS — M6281 Muscle weakness (generalized): Secondary | ICD-10-CM | POA: Diagnosis not present

## 2020-10-28 DIAGNOSIS — R2689 Other abnormalities of gait and mobility: Secondary | ICD-10-CM | POA: Diagnosis not present

## 2020-10-28 DIAGNOSIS — M25562 Pain in left knee: Secondary | ICD-10-CM | POA: Diagnosis not present

## 2020-10-28 NOTE — Therapy (Signed)
[place], [place], [address] Phone: [phone]   Fax:  [phone]  Physical Therapy Treatment  Patient Details  Name: Rachel Carr MRN: [id number] Date of Birth: 08-Dec-1950 Referring Provider (PT): Frederik Pear   Encounter Date: 10/28/2020   PT End of Session - 10/28/20 1057    Visit Number 8    Number of Visits 12    Date for PT Re-Evaluation 11/13/20    Authorization Type [employer]    Authorization - Visit Number 8    Authorization - Number of Visits 12    Progress Note Due on Visit 10    PT Start Time 1015    PT Stop Time 1056    PT Time Calculation (min) 41 min    Activity Tolerance Patient tolerated treatment well   nausea   Behavior During Therapy [facility] for tasks assessed/performed           Past Medical History:  Diagnosis Date  . Barrett's syndrome   . CAD (coronary artery disease)   . Esophageal stricture   . Esophageal ulcer 09-2010   gastritis Dr. Glennon Hamilton  . Gastric polyps    colonoscopy 2012(due again for repeat 2017)  . Shingles    on the right eye    Past Surgical History:  Procedure Laterality Date  . CARPAL TUNNEL RELEASE    . CHOLECYSTECTOMY    . COMPLETE MASTECTOMY W/ SENTINEL NODE BIOPSY    . NISSEN FUNDOPLICATION  8786  . RADIOFREQUENCY ABLATION    . RT knee surgery    . TOTAL ABDOMINAL HYSTERECTOMY W/ BILATERAL SALPINGOOPHORECTOMY     endometriosis    There were no vitals filed for this visit.   Subjective Assessment - 10/28/20 1027    Subjective Pt reports she didn't sleep well, because she has a lot going on.  Otherwise, "it's getting better every day" (left knee)    Currently in Pain? Yes    Pain Score 2     Pain Location Knee    Pain Orientation Left    Pain Descriptors / Indicators Sore    Aggravating Factors  ?    Pain Relieving Factors ice, massage              [facility] PT Assessment - 10/28/20 0001      Assessment   Medical  Diagnosis s/p Lt TKR    Referring Provider (PT) Frederik Pear    Onset Date/Surgical Date 09/20/20    Next MD Visit 10/29/20    Prior Therapy [facility]      Observation/Other Assessments   Focus on Therapeutic Outcomes (FOTO)  72 functional status (goal of 74)      PROM   PROM Assessment Site Knee    Right/Left Knee Left    Left Knee Extension 5    Left Knee Flexion 118            [facility] Adult PT Treatment/Exercise - 10/28/20 0001      Lumbar Exercises: Stretches   Hip Flexor Stretch Left;3 reps;Right;2 reps   seated.     Knee/Hip Exercises: Stretches   Passive Hamstring Stretch Left;30 seconds;4 reps   supine with strap and long sitting   Gastroc Stretch Both;2 reps;30 seconds   L2 on incline board     Knee/Hip Exercises: Aerobic   Nustep L5: x 4 min for warm up    Other Aerobic single laps around gym in between exercises to decrease stiffness and assess  response to exercises.      Knee/Hip Exercises: Standing   Forward Step Up Left;1 set;10 reps;Hand Hold: 1;Step Height: 8"    SLS SLS (R/L) with toe taps to 6" surface, no UE support; SLS lifting opp leg up and over lateral 6" obstacle without UE support x 10 each leg    Other Standing Knee Exercises standing to sitting on 9" stool (to simulate getting on garden bench) with UE support onto chair.      Knee/Hip Exercises: Supine   Heel Slides AAROM;Left;5 reps   10 sec hold     Manual Therapy   Soft tissue mobilization STM to Lt lateral quad, biceps femoris to decrease fascial restrictions.          self care: pt shown pin and stretch with ball to Lt hamstring. Pt returned demo with cues.       PT Long Term Goals - 10/28/20 1100      PT LONG TERM GOAL #1   Title Pt will be independent with HEP    Time 6    Period Weeks    Status Partially Met      PT LONG TERM GOAL #2   Title Pt will improve FOTO to >= 74 to demo improved functional mobility    Baseline 72 FS.    Time 6    Period Weeks    Status Partially Met       PT LONG TERM GOAL #3   Title Pt will improve Lt LE strength to 4+/5 to perform gait and mobility with decreased pain    Baseline Lt knee 5/5    Time 6    Period Weeks    Status Partially Met      PT LONG TERM GOAL #4   Title Pt will perform gait with LRAD with normal gait pattern to reduce pain    Baseline some lateral Rt lean and decreased hip flexion with Lt swing through phase.    Time 6    Period Weeks    Status Partially Met      PT LONG TERM GOAL #5   Title Pt will improve Lt knee ROM to 2-110 to match Rt knee    Time 6    Period Weeks    Status Partially Met                 Plan - 10/28/20 1058    Clinical Impression Statement Pt demonstrates improved Lt knee ROM; has partially met LTG#5. She tolerated exercises well, without increase in pain.  Some unsteadiness noted with SLS exercises.   Persistant tightness in Lt biceps femoris; pt shown alternative hamstring stretches and self massage to this area.  Pt has partially met her goals and will benefit from continued PT intervention to maximize functional mobility and return to PLOF.    Personal Factors and Comorbidities Age;Fitness;Comorbidity 3+;Time since onset of injury/illness/exacerbation    Comorbidities CAD, OA, obesity    Examination-Activity Limitations Bed Mobility;Locomotion Level;Transfers;Carry;Sit;Squat;Dressing;Stairs;Stand    Examination-Participation Restrictions Church;Cleaning;Community Activity;Driving;Shop    Stability/Clinical Decision Making Stable/Uncomplicated    Rehab Potential Good    PT Frequency 2x / week    PT Duration 6 weeks    PT Treatment/Interventions ADLs/Self Care Home Management;Cryotherapy;DME Instruction;Gait training;Stair training;Functional mobility training;Therapeutic activities;Therapeutic exercise;Balance training;Neuromuscular re-education;Patient/family education;Manual techniques;Compression bandaging;Scar mobilization;Passive range of motion;Dry needling;Energy  conservation;Taping;Vasopneumatic Device;Joint Manipulations;Aquatic Therapy;Moist Heat;Electrical Stimulation    PT Next Visit Plan continue progress Lt knee strengthening, ROM, and balance.  PT Home Exercise Plan Access Code: [email or web address]    Consulted and Agree with Plan of Care Patient           Patient will benefit from skilled therapeutic intervention in order to improve the following deficits and impairments:  Abnormal gait,Decreased range of motion,Difficulty walking,Decreased activity tolerance,Pain,Decreased balance,Decreased knowledge of use of DME,Decreased scar mobility,Hypomobility,Impaired flexibility,Decreased mobility,Decreased strength,Increased edema  Visit Diagnosis: Acute pain of left knee  Muscle weakness (generalized)  Other abnormalities of gait and mobility     Problem List Patient Active Problem List   Diagnosis Date Noted  . Preop general physical exam 06/12/2020  . Bilateral primary osteoarthritis of knee 11/26/2016  . CAD S/P percutaneous coronary angioplasty 09/29/2016  . Unstable angina ([place]) 02/27/2016  . IFG (impaired fasting glucose) 11/20/2015  . 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 Regurgitation 12/10/2011  . Obese 10/14/2011  . Barrett's esophagus 01/13/2011  . Tobacco use disorder 01/02/2011  . COLONIC POLYPS 10/10/2010  . ULCER OF ESOPHAGUS WITHOUT BLEEDING 10/10/2010  . GERD 08/15/2010  . 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

## 2020-10-29 ENCOUNTER — Encounter: Payer: Medicare HMO | Admitting: Physical Therapy

## 2020-10-31 ENCOUNTER — Other Ambulatory Visit: Payer: Self-pay

## 2020-10-31 ENCOUNTER — Ambulatory Visit (INDEPENDENT_AMBULATORY_CARE_PROVIDER_SITE_OTHER): Payer: Medicare HMO | Admitting: Physical Therapy

## 2020-10-31 DIAGNOSIS — M25562 Pain in left knee: Secondary | ICD-10-CM

## 2020-10-31 DIAGNOSIS — R2689 Other abnormalities of gait and mobility: Secondary | ICD-10-CM

## 2020-10-31 DIAGNOSIS — M6281 Muscle weakness (generalized): Secondary | ICD-10-CM

## 2020-10-31 NOTE — Therapy (Signed)
[place], [place], [address] Phone: [phone]   Fax:  [phone]  Physical Therapy Treatment  Patient Details  Name: Rachel Carr MRN: [id number] Date of Birth: 03-29-1951 Referring Provider (PT): Frederik Pear   Encounter Date: 10/31/2020   PT End of Session - 10/31/20 1231    Visit Number 9    Number of Visits 12    Date for PT Re-Evaluation 11/13/20    Authorization Type [employer] Medicare    Authorization - Visit Number 9    Authorization - Number of Visits 12    Progress Note Due on Visit 10    PT Start Time 6734    PT Stop Time 1225    PT Time Calculation (min) 40 min    Activity Tolerance Patient tolerated treatment well   nausea   Behavior During Therapy [facility] for tasks assessed/performed           Past Medical History:  Diagnosis Date  . Barrett's syndrome   . CAD (coronary artery disease)   . Esophageal stricture   . Esophageal ulcer 09-2010   gastritis Dr. Glennon Hamilton  . Gastric polyps    colonoscopy 2012(due again for repeat 2017)  . Shingles    on the right eye    Past Surgical History:  Procedure Laterality Date  . CARPAL TUNNEL RELEASE    . CHOLECYSTECTOMY    . COMPLETE MASTECTOMY W/ SENTINEL NODE BIOPSY    . NISSEN FUNDOPLICATION  1937  . RADIOFREQUENCY ABLATION    . RT knee surgery    . TOTAL ABDOMINAL HYSTERECTOMY W/ BILATERAL SALPINGOOPHORECTOMY     endometriosis    There were no vitals filed for this visit.   Subjective Assessment - 10/31/20 1152    Subjective Pt returned to surgeon's office. She reports Dr was pleased with progress and that she can finish out her last scheduled visits.  No other new changes.    Currently in Pain? Yes    Pain Score 1     Pain Location Knee    Pain Orientation Left    Pain Descriptors / Indicators Sore              [facility] PT Assessment - 10/31/20 0001      Assessment   Medical Diagnosis s/p Lt TKR    Referring  Provider (PT) Frederik Pear    Onset Date/Surgical Date 09/20/20    Next MD Visit June 2022    Prior Therapy [facility]PT           [facility] Adult PT Treatment/Exercise - 10/31/20 0001      Knee/Hip Exercises: Stretches   Passive Hamstring Stretch Left;2 reps;60 seconds   single leg long sitting   Quad Stretch Left;2 reps;20 seconds   via seated hip flexor/quad stretch   Gastroc Stretch Both;2 reps;30 seconds   L2 on incline board     Knee/Hip Exercises: Aerobic   Nustep L5: x 5 min for warm up    Other Aerobic single laps around gym in between exercises to decrease stiffness and assess response to exercises.      Knee/Hip Exercises: Standing   Lateral Step Up Left;1 set;10 reps;Limitations;Step Height: 8";Step Height: 6"    Lateral Step Up Limitations unable to tolerate 8" step due to increase twisting / pain at lateral knee - able to tolerate 6" well.    Forward Step Up Left;1 set;10 reps;Hand Hold: 1;Step Height: 8"    SLS SLS lifting  opp leg up and over lateral 3"/ 6" obstacle without UE support x 15 each leg; repeated x 8 reps stepping forward/backward over blocks.  SLS with toe taps front, side, back x 5 each leg (required UE support when standing on LLE due to decreased balance)                       PT Long Term Goals - 10/28/20 1100      PT LONG TERM GOAL #1   Title Pt will be independent with HEP    Time 6    Period Weeks    Status Partially Met      PT LONG TERM GOAL #2   Title Pt will improve FOTO to >= 74 to demo improved functional mobility    Baseline 72 FS.    Time 6    Period Weeks    Status Partially Met      PT LONG TERM GOAL #3   Title Pt will improve Lt LE strength to 4+/5 to perform gait and mobility with decreased pain    Baseline Lt knee 5/5    Time 6    Period Weeks    Status Partially Met      PT LONG TERM GOAL #4   Title Pt will perform gait with LRAD with normal gait pattern to reduce pain    Baseline some lateral Rt lean and decreased hip  flexion with Lt swing through phase.    Time 6    Period Weeks    Status Partially Met      PT LONG TERM GOAL #5   Title Pt will improve Lt knee ROM to 2-110 to match Rt knee    Time 6    Period Weeks    Status Partially Met                 Plan - 10/31/20 1226    Clinical Impression Statement Pt continues with decreased balance with Lt SLS; spent time with SLS exercises for strengthening Lt kinetic chain.  Pt tolerated all exercises well, except for lateral step up on 8" stair; this increased Lt lateral knee pain. Pt verbalized readiness to d/c to HEP after next 2 scheduled visits.    Personal Factors and Comorbidities Age;Fitness;Comorbidity 3+;Time since onset of injury/illness/exacerbation    Comorbidities CAD, OA, obesity    Examination-Activity Limitations Bed Mobility;Locomotion Level;Transfers;Carry;Sit;Squat;Dressing;Stairs;Stand    Examination-Participation Restrictions Church;Cleaning;Community Activity;Driving;Shop    Stability/Clinical Decision Making Stable/Uncomplicated    Rehab Potential Good    PT Frequency 2x / week    PT Duration 6 weeks    PT Treatment/Interventions ADLs/Self Care Home Management;Cryotherapy;DME Instruction;Gait training;Stair training;Functional mobility training;Therapeutic activities;Therapeutic exercise;Balance training;Neuromuscular re-education;Patient/family education;Manual techniques;Compression bandaging;Scar mobilization;Passive range of motion;Dry needling;Energy conservation;Taping;Vasopneumatic Device;Joint Manipulations;Aquatic Therapy;Moist Heat;Electrical Stimulation    PT Next Visit Plan continue progress Lt knee strengthening, ROM, and balance. 10th visit note    PT Home Exercise Plan Access Code: [email or web address]    Consulted and Agree with Plan of Care Patient           Patient will benefit from skilled therapeutic intervention in order to improve the following deficits and impairments:  Abnormal gait,Decreased range of  motion,Difficulty walking,Decreased activity tolerance,Pain,Decreased balance,Decreased knowledge of use of DME,Decreased scar mobility,Hypomobility,Impaired flexibility,Decreased mobility,Decreased strength,Increased edema  Visit Diagnosis: Acute pain of left knee  Muscle weakness (generalized)  Other abnormalities of gait and mobility     Problem List Patient Active Problem List  Diagnosis Date Noted  . Preop general physical exam 06/12/2020  . Bilateral primary osteoarthritis of knee 11/26/2016  . CAD S/P percutaneous coronary angioplasty 09/29/2016  . Unstable angina ([place]) 02/27/2016  . IFG (impaired fasting glucose) 11/20/2015  . 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 Regurgitation 12/10/2011  . Obese 10/14/2011  . Barrett's esophagus 01/13/2011  . Tobacco use disorder 01/02/2011  . COLONIC POLYPS 10/10/2010  . ULCER OF ESOPHAGUS WITHOUT BLEEDING 10/10/2010  . GERD 08/15/2010  . 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

## 2020-11-05 ENCOUNTER — Ambulatory Visit (INDEPENDENT_AMBULATORY_CARE_PROVIDER_SITE_OTHER): Payer: Medicare HMO | Admitting: Physical Therapy

## 2020-11-05 ENCOUNTER — Encounter: Payer: Self-pay | Admitting: Physical Therapy

## 2020-11-05 ENCOUNTER — Other Ambulatory Visit: Payer: Self-pay

## 2020-11-05 DIAGNOSIS — M25562 Pain in left knee: Secondary | ICD-10-CM

## 2020-11-05 DIAGNOSIS — R2689 Other abnormalities of gait and mobility: Secondary | ICD-10-CM | POA: Diagnosis not present

## 2020-11-05 DIAGNOSIS — M6281 Muscle weakness (generalized): Secondary | ICD-10-CM | POA: Diagnosis not present

## 2020-11-05 NOTE — Therapy (Signed)
[place], [place], [address] Phone: [phone]   Fax:  [phone]  Physical Therapy Treatment and 10th visit note  Patient Details  Name: Rachel Carr MRN: [id number] Date of Birth: [birth year] Referring Provider (PT): Frederik Pear  Dates of service: 10/02/20-11/05/20 Encounter Date: 11/05/2020   PT End of Session - 11/05/20 0937    Visit Number 10    Number of Visits 12    Date for PT Re-Evaluation 11/13/20    Authorization Type [employer] Medicare    Authorization - Visit Number 10    Authorization - Number of Visits 12    Progress Note Due on Visit 10    PT Start Time 0931    PT Stop Time 1010    PT Time Calculation (min) 39 min    Activity Tolerance Patient tolerated treatment well   nausea   Behavior During Therapy [facility] for tasks assessed/performed           Past Medical History:  Diagnosis Date  . Barrett's syndrome   . CAD (coronary artery disease)   . Esophageal stricture   . Esophageal ulcer 09-2010   gastritis Dr. Glennon Hamilton  . Gastric polyps    colonoscopy 2012(due again for repeat 2017)  . Shingles    on the right eye    Past Surgical History:  Procedure Laterality Date  . CARPAL TUNNEL RELEASE    . CHOLECYSTECTOMY    . COMPLETE MASTECTOMY W/ SENTINEL NODE BIOPSY    . NISSEN FUNDOPLICATION  8938  . RADIOFREQUENCY ABLATION    . RT knee surgery    . TOTAL ABDOMINAL HYSTERECTOMY W/ BILATERAL SALPINGOOPHORECTOMY     endometriosis    There were no vitals filed for this visit.   Subjective Assessment - 11/05/20 0935    Subjective Pt reports she did some work in the yard yesterday.  When she got up her Lt knee felt tight and sore.  She complains of stiffness.    Patient Stated Goals get back to gardening, reduce pain    Currently in Pain? Yes    Pain Score 2     Pain Location Knee    Pain Orientation Left;Medial    Pain Descriptors / Indicators Sore;Tightness     Aggravating Factors  ?    Pain Relieving Factors tylenol.              [facility] PT Assessment - 11/05/20 0001      Assessment   Medical Diagnosis s/p Lt TKR    Referring Provider (PT) Frederik Pear    Onset Date/Surgical Date 09/20/20    Next MD Visit June 2022    Prior Therapy HHPT      AROM   Left Knee Flexion 112      PROM   Left Knee Extension 2            [facility] Adult PT Treatment/Exercise - 11/05/20 0001      Lumbar Exercises: Stretches   Lower Trunk Rotation 2 reps;10 seconds   arms in T   Hip Flexor Stretch Left;2 reps;30 seconds      Knee/Hip Exercises: Stretches   Passive Hamstring Stretch Left;3 reps;30 seconds    Quad Stretch Left;3 reps;30 seconds    Piriformis Stretch Right;Left;2 reps;20 seconds   modified pigeon pose   Gastroc Stretch Both;2 reps;20 seconds      Knee/Hip Exercises: Aerobic   Nustep L4: 5 min for warm up.    Other  Aerobic single laps around gym (with occasional steps up/down stairs)  in between exercises to decrease stiffness and assess response to exercises.      Knee/Hip Exercises: Seated   Sit to Sand 1 set;10 reps;without UE support      Knee/Hip Exercises: Prone   Hamstring Curl 1 set;5 reps;3 seconds             PT Long Term Goals - 10/28/20 1100      PT LONG TERM GOAL #1   Title Pt will be independent with HEP    Time 6    Period Weeks    Status Partially Met      PT LONG TERM GOAL #2   Title Pt will improve FOTO to >= 74 to demo improved functional mobility    Baseline 72 FS.    Time 6    Period Weeks    Status Partially Met      PT LONG TERM GOAL #3   Title Pt will improve Lt LE strength to 4+/5 to perform gait and mobility with decreased pain    Baseline Lt knee 5/5    Time 6    Period Weeks    Status Partially Met      PT LONG TERM GOAL #4   Title Pt will perform gait with LRAD with normal gait pattern to reduce pain    Baseline some lateral Rt lean and decreased hip flexion with Lt swing through phase.     Time 6    Period Weeks    Status Partially Met      PT LONG TERM GOAL #5   Title Pt will improve Lt knee ROM to 2-110 to match Rt knee    Time 6    Period Weeks    Status Partially Met                 Plan - 11/05/20 1012    Clinical Impression Statement Session focused on flexibility and stretches per pt request due to increased level of reported stiffness/ soreness from work in garden on previous day.  Pt reported reduction of pain at end of session.  Pt has met her Lt knee ROM goal.  She has made good progress towards all goals and verbalized readiness to transition to HEP after next visit.    Personal Factors and Comorbidities Age;Fitness;Comorbidity 3+;Time since onset of injury/illness/exacerbation    Comorbidities CAD, OA, obesity    Examination-Activity Limitations Bed Mobility;Locomotion Level;Transfers;Carry;Sit;Squat;Dressing;Stairs;Stand    Examination-Participation Restrictions Church;Cleaning;Community Activity;Driving;Shop    Stability/Clinical Decision Making Stable/Uncomplicated    Rehab Potential Good    PT Frequency 2x / week    PT Duration 6 weeks    PT Treatment/Interventions ADLs/Self Care Home Management;Cryotherapy;DME Instruction;Gait training;Stair training;Functional mobility training;Therapeutic activities;Therapeutic exercise;Balance training;Neuromuscular re-education;Patient/family education;Manual techniques;Compression bandaging;Scar mobilization;Passive range of motion;Dry needling;Energy conservation;Taping;Vasopneumatic Device;Joint Manipulations;Aquatic Therapy;Moist Heat;Electrical Stimulation    PT Next Visit Plan progress/ finalize HEP. d/c.    PT Home Exercise Plan Access Code: [email or web address]    Consulted and Agree with Plan of Care Patient           Patient will benefit from skilled therapeutic intervention in order to improve the following deficits and impairments:  Abnormal gait,Decreased range of motion,Difficulty walking,Decreased  activity tolerance,Pain,Decreased balance,Decreased knowledge of use of DME,Decreased scar mobility,Hypomobility,Impaired flexibility,Decreased mobility,Decreased strength,Increased edema  Visit Diagnosis: Acute pain of left knee  Muscle weakness (generalized)  Other abnormalities of gait and mobility     Problem List Patient Active  Problem List   Diagnosis Date Noted  . Preop general physical exam 06/12/2020  . Bilateral primary osteoarthritis of knee 11/26/2016  . CAD S/P percutaneous coronary angioplasty 09/29/2016  . Unstable angina ([place]) 02/27/2016  . IFG (impaired fasting glucose) 11/20/2015  . 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 Regurgitation 12/10/2011  . Obese 10/14/2011  . Barrett's esophagus 01/13/2011  . Tobacco use disorder 01/02/2011  . COLONIC POLYPS 10/10/2010  . ULCER OF ESOPHAGUS WITHOUT BLEEDING 10/10/2010  . GERD 08/15/2010  . 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

## 2020-11-07 ENCOUNTER — Encounter: Payer: Medicare HMO | Admitting: Physical Therapy

## 2020-11-12 ENCOUNTER — Other Ambulatory Visit: Payer: Self-pay

## 2020-11-12 ENCOUNTER — Encounter: Payer: Self-pay | Admitting: Physical Therapy

## 2020-11-12 ENCOUNTER — Ambulatory Visit (INDEPENDENT_AMBULATORY_CARE_PROVIDER_SITE_OTHER): Payer: Medicare HMO | Admitting: Physical Therapy

## 2020-11-12 DIAGNOSIS — R2689 Other abnormalities of gait and mobility: Secondary | ICD-10-CM

## 2020-11-12 DIAGNOSIS — M25562 Pain in left knee: Secondary | ICD-10-CM | POA: Diagnosis not present

## 2020-11-12 DIAGNOSIS — M6281 Muscle weakness (generalized): Secondary | ICD-10-CM | POA: Diagnosis not present

## 2020-11-12 NOTE — Therapy (Addendum)
[facility] [place], [place], [address] Phone: [phone]   Fax:  [phone]  Physical Therapy Treatment and Discharge  Patient Details  Name: Rachel Carr MRN: [id number] Date of Birth: August 30, 1950 Referring Provider (PT): Frederik Pear   Encounter Date: 11/12/2020   PT End of Session - 11/12/20 1105    Visit Number 11    Number of Visits 12    Date for PT Re-Evaluation 11/13/20    Authorization Type [employer]    Authorization - Visit Number 11    Authorization - Number of Visits 12    PT Start Time 1055    PT Stop Time 1126    PT Time Calculation (min) 31 min    Activity Tolerance Patient tolerated treatment well   nausea   Behavior During Therapy [facility] for tasks assessed/performed           Past Medical History:  Diagnosis Date  . Barrett's syndrome   . CAD (coronary artery disease)   . Esophageal stricture   . Esophageal ulcer 09-2010   gastritis Dr. Glennon Hamilton  . Gastric polyps    colonoscopy 2012(due again for repeat 2017)  . Shingles    on the right eye    Past Surgical History:  Procedure Laterality Date  . CARPAL TUNNEL RELEASE    . CHOLECYSTECTOMY    . COMPLETE MASTECTOMY W/ SENTINEL NODE BIOPSY    . NISSEN FUNDOPLICATION  7001  . RADIOFREQUENCY ABLATION    . RT knee surgery    . TOTAL ABDOMINAL HYSTERECTOMY W/ BILATERAL SALPINGOOPHORECTOMY     endometriosis    There were no vitals filed for this visit.   Subjective Assessment - 11/12/20 1057    Subjective Pt reports she has been working in her yard over the last week. She raked 1 acre.  Afterwards her Lt knee felt "so tight and occasionally buckled".  She complains occasional swelling in the back of the knee.  She verbalized readiness to d/c after today's visit.    Pertinent History Rt TKR 11/21    Patient Stated Goals get back to gardening, reduce pain    Currently in Pain? Yes    Pain Score 1     Pain Location Knee     Pain Orientation Left    Pain Descriptors / Indicators Sore    Aggravating Factors  ?    Pain Relieving Factors tylenol, low heat              [facility] PT Assessment - 11/12/20 0001      Assessment   Medical Diagnosis s/p Lt TKR    Referring Provider (PT) Frederik Pear    Onset Date/Surgical Date 09/20/20    Next MD Visit June 2022    Prior Therapy [facility]      Observation/Other Assessments   Focus on Therapeutic Outcomes (FOTO)  77 functional score.      PROM   Left Knee Extension 2    Left Knee Flexion 118           [facility] Adult PT Treatment/Exercise - 11/12/20 0001      Lumbar Exercises: Stretches   Single Knee to Chest Stretch Right;Left;2 reps;20 seconds   after bridges     Knee/Hip Exercises: Stretches   Passive Hamstring Stretch Left;2 reps;60 seconds;Right;1 rep;30 seconds    Quad Stretch Left;3 reps;30 seconds    Knee: Self-Stretch Limitations Lt/Rt forward lunge with foot on low mat table x 30 sec,  2 reps.  Lt seated scoot x 20 sec    Piriformis Stretch Right;Left;2 reps;20 seconds   modified pigeon pose   Gastroc Stretch Right;Left;2 reps;20 seconds   heel off of step     Knee/Hip Exercises: Aerobic   Nustep L4: 5 min for warm up.    Other Aerobic single laps around gym (with occasional steps up/down stairs)  in between exercises to decrease stiffness and assess response to exercises.      Knee/Hip Exercises: Standing   SLS SLS with opp toe taps front, side, back x 5 reps      Knee/Hip Exercises: Supine   Heel Slides Left;5 reps   strap assist   Bridges 1 set;10 reps   cues to set glutes first.              PT Long Term Goals - 11/12/20 1101      PT LONG TERM GOAL #1   Title Pt will be independent with HEP    Time 6    Period Weeks    Status Achieved      PT LONG TERM GOAL #2   Title Pt will improve FOTO to >= 74 to demo improved functional mobility    Baseline --    Time 6    Period Weeks    Status Achieved      PT LONG TERM GOAL #3   Title  Pt will improve Lt LE strength to 4+/5 to perform gait and mobility with decreased pain    Baseline --    Time 6    Period Weeks    Status Achieved      PT LONG TERM GOAL #4   Title Pt will perform gait with LRAD with normal gait pattern to reduce pain    Time 6    Period Weeks    Status Achieved      PT LONG TERM GOAL #5   Title Pt will improve Lt knee ROM to 2-110 to match Rt knee    Baseline 2-118 deg    Time 6    Period Weeks    Status Achieved                 Plan - 11/12/20 1121    Clinical Impression Statement Pt tolerated all exercise well, reporting reduction of tightness and pain afterwards.  Pt has met all of her goals and is pleased with level of function.    Personal Factors and Comorbidities Age;Fitness;Comorbidity 3+;Time since onset of injury/illness/exacerbation    Comorbidities CAD, OA, obesity    Examination-Activity Limitations Bed Mobility;Locomotion Level;Transfers;Carry;Sit;Squat;Dressing;Stairs;Stand    Examination-Participation Restrictions Church;Cleaning;Community Activity;Driving;Shop    Stability/Clinical Decision Making Stable/Uncomplicated    Rehab Potential Good    PT Frequency 2x / week    PT Duration 6 weeks    PT Treatment/Interventions ADLs/Self Care Home Management;Cryotherapy;DME Instruction;Gait training;Stair training;Functional mobility training;Therapeutic activities;Therapeutic exercise;Balance training;Neuromuscular re-education;Patient/family education;Manual techniques;Compression bandaging;Scar mobilization;Passive range of motion;Dry needling;Energy conservation;Taping;Vasopneumatic Device;Joint Manipulations;Aquatic Therapy;Moist Heat;Electrical Stimulation    PT Next Visit Plan Spoke to supervising PT will d/c.    PT Home Exercise Plan Access Code: [email or web address]    Consulted and Agree with Plan of Care Patient           Patient will benefit from skilled therapeutic intervention in order to improve the following deficits and  impairments:  Abnormal gait,Decreased range of motion,Difficulty walking,Decreased activity tolerance,Pain,Decreased balance,Decreased knowledge of use of DME,Decreased scar mobility,Hypomobility,Impaired flexibility,Decreased mobility,Decreased strength,Increased edema  Visit Diagnosis: Acute  pain of left knee  Muscle weakness (generalized)  Other abnormalities of gait and mobility     Problem List Patient Active Problem List   Diagnosis Date Noted  . Preop general physical exam 06/12/2020  . Bilateral primary osteoarthritis of knee 11/26/2016  . CAD S/P percutaneous coronary angioplasty 09/29/2016  . Unstable angina ([place]) 02/27/2016  . IFG (impaired fasting glucose) 11/20/2015  . 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 Regurgitation 12/10/2011  . Obese 10/14/2011  . Barrett's esophagus 01/13/2011  . Tobacco use disorder 01/02/2011  . COLONIC POLYPS 10/10/2010  . ULCER OF ESOPHAGUS WITHOUT BLEEDING 10/10/2010  . GERD 08/15/2010  . 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  Patient goals were met. Patient is being discharged due to meeting the stated rehab goals.  ?????     Isabelle Course, PT,DPT04/08/221:32 PM   Kerin Perna, PTA 11/12/20 11:34 AM  [facility] [place], [place], [address] Phone: [phone]   Fax:  [phone]  Name: Rachel Carr MRN: [id number] Date of Birth: [birth year]

## 2020-11-19 IMAGING — MG DIGITAL SCREENING BILAT W/ TOMO W/ CAD
8 series · 8 of 24 positions shown · non-contrast
Comparison: Previous exam(s).

CLINICAL DATA: Screening.

EXAM:
DIGITAL SCREENING BILATERAL MAMMOGRAM WITH TOMO AND CAD

[L MLO synth-2D]
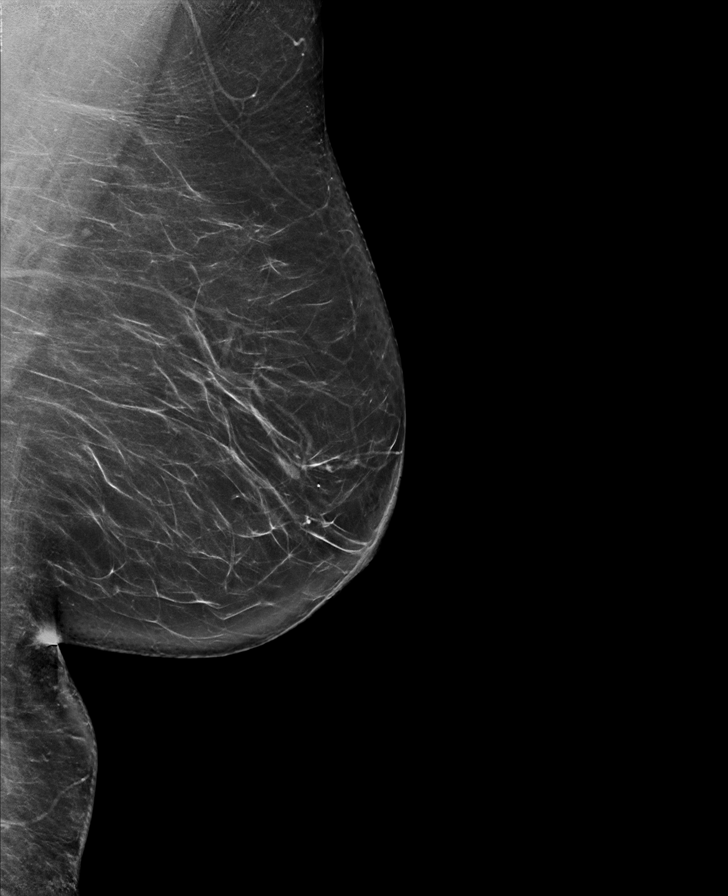

[R CC synth-2D]
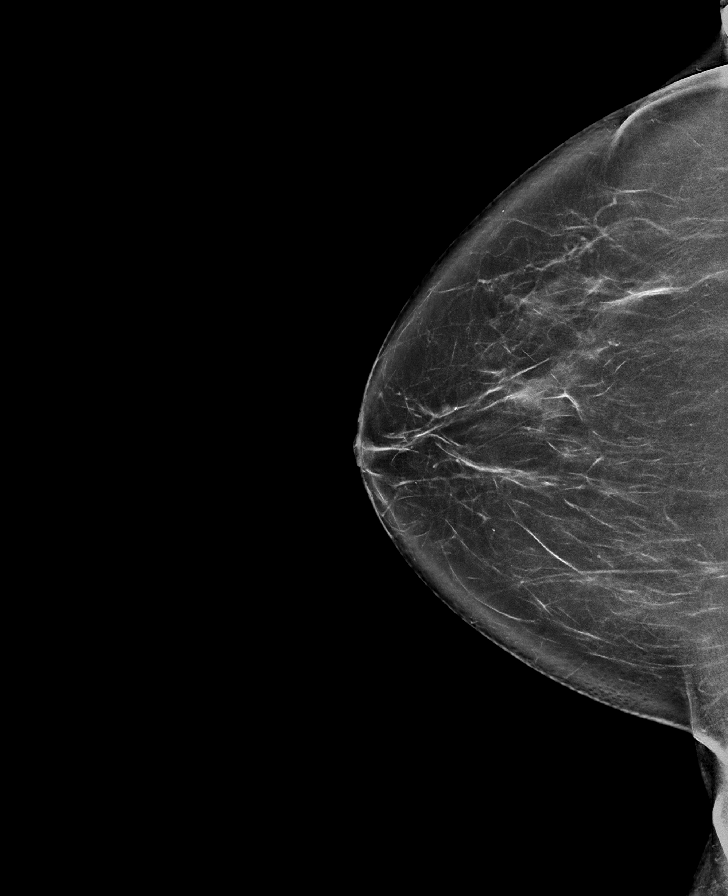

[L CC synth-2D]
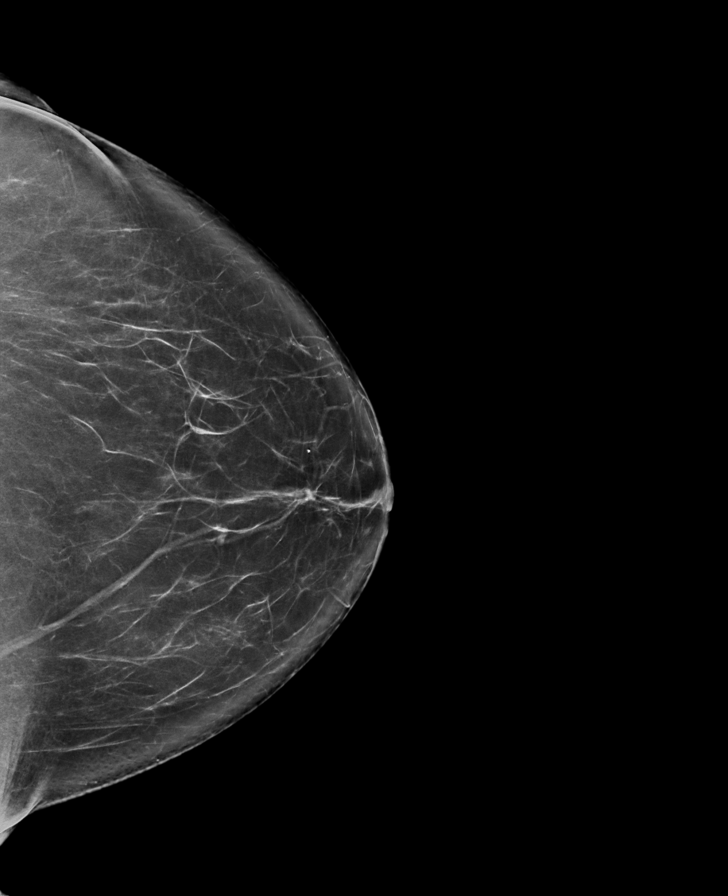

[R MLO synth-2D]
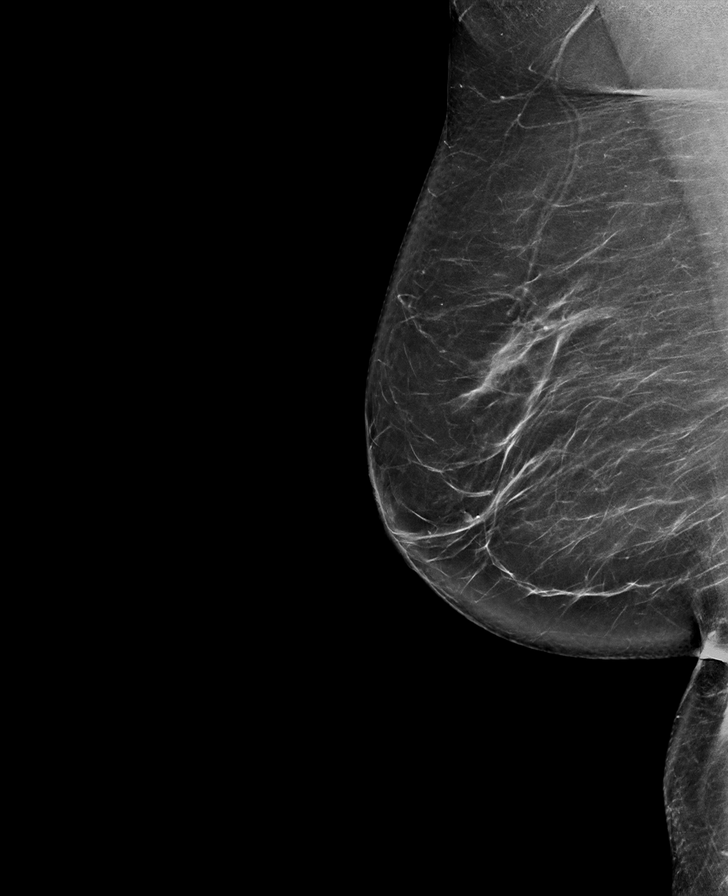

[R CC tomo · tomo slice 40/79.0]
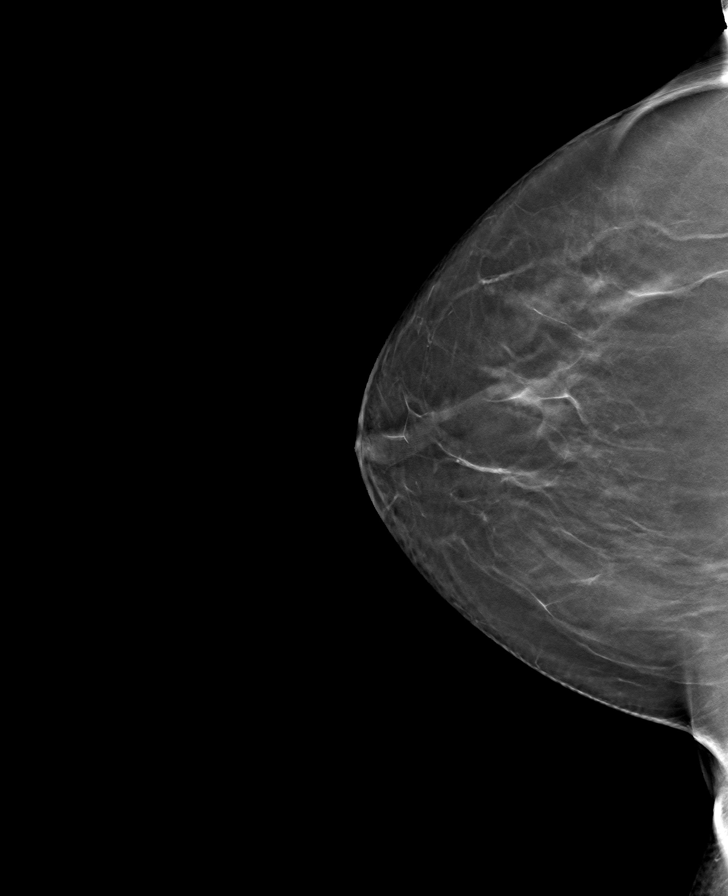

[L MLO tomo · tomo slice 41/80.0]
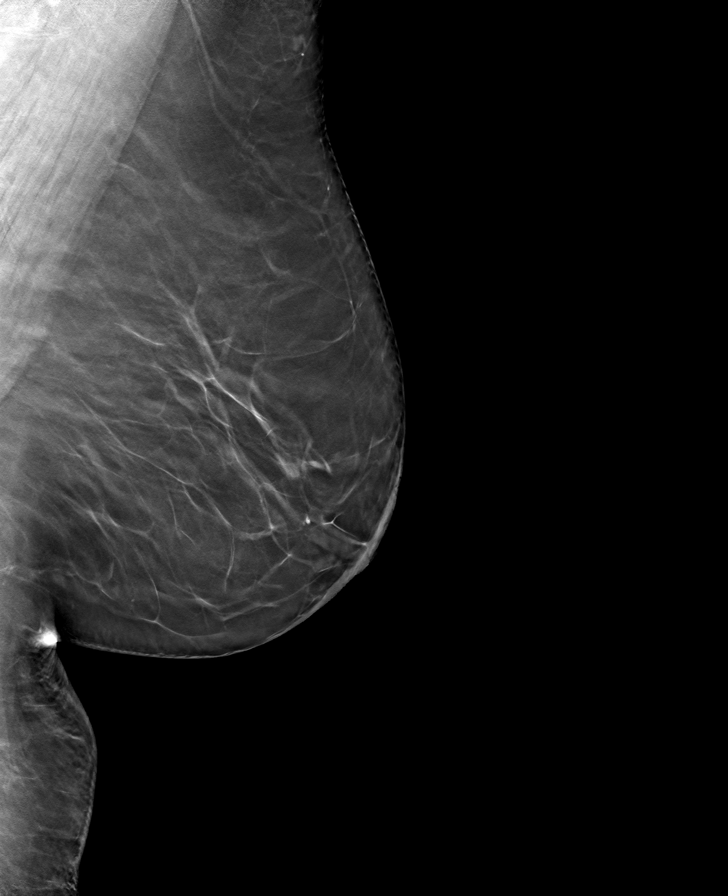

[R MLO tomo · tomo slice 41/80.0]
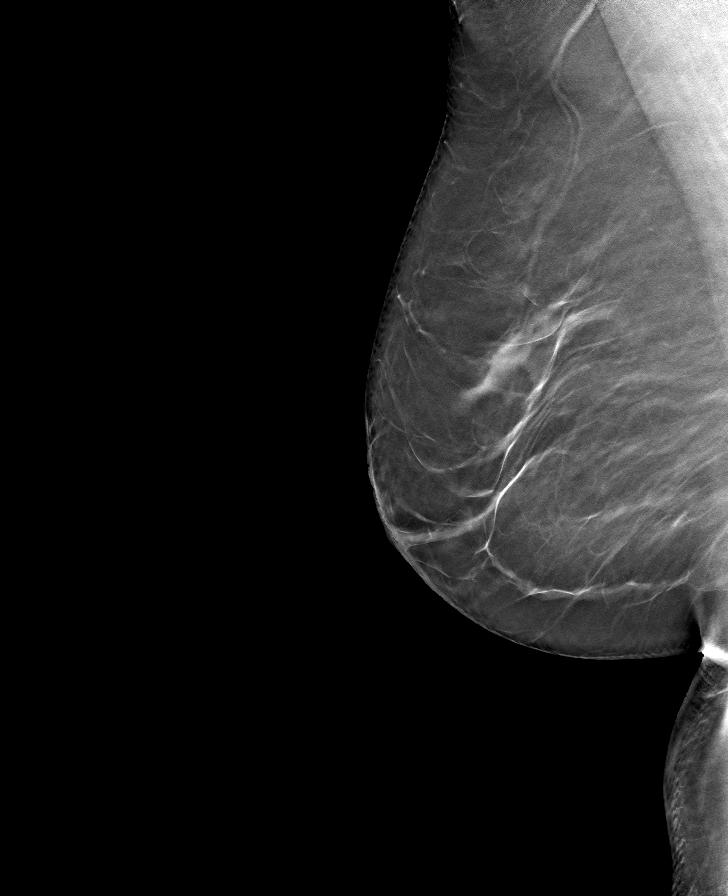

[L CC tomo · tomo slice 39/78.0]
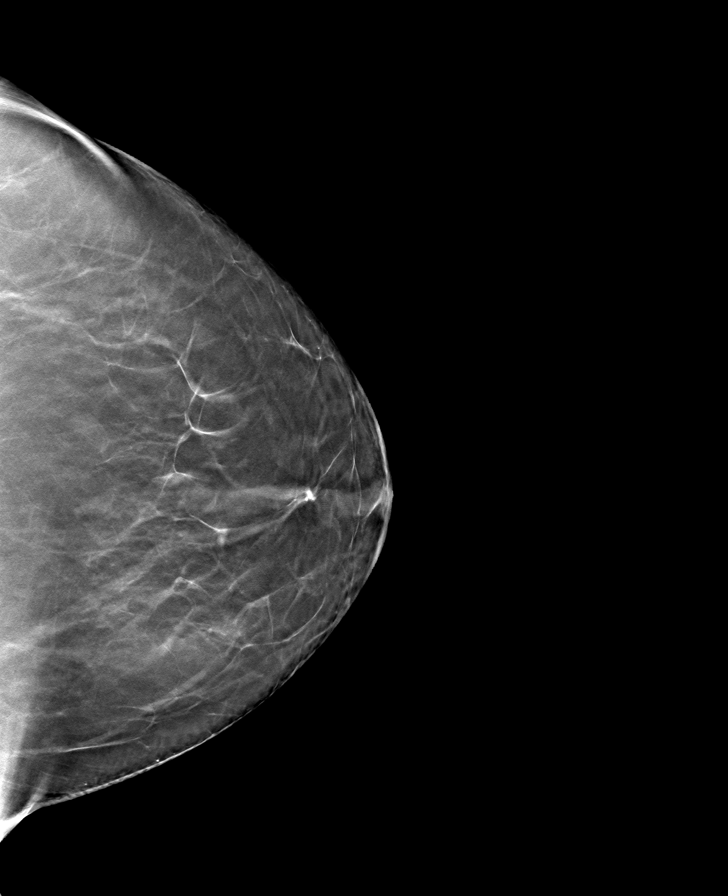

[8 of 24 positions shown; findings below may reference images not displayed]

ACR Breast Density Category b: There are scattered areas of
fibroglandular density.
FINDINGS: There are no findings suspicious for malignancy. Images were
processed with CAD.
IMPRESSION: No mammographic evidence of malignancy. A result letter of this
screening mammogram will be mailed directly to the patient.

RECOMMENDATION:
Screening mammogram in one year. (Code:[id number])

BI-RADS CATEGORY  1: Negative.

## 2020-11-30 ENCOUNTER — Other Ambulatory Visit: Payer: Self-pay | Admitting: Family Medicine

## 2020-12-11 ENCOUNTER — Ambulatory Visit: Payer: Medicare HMO | Admitting: Family Medicine

## 2020-12-12 ENCOUNTER — Encounter: Payer: Self-pay | Admitting: Family Medicine

## 2020-12-12 ENCOUNTER — Ambulatory Visit (INDEPENDENT_AMBULATORY_CARE_PROVIDER_SITE_OTHER): Payer: Medicare HMO | Admitting: Family Medicine

## 2020-12-12 ENCOUNTER — Other Ambulatory Visit: Payer: Self-pay

## 2020-12-12 VITALS — BP 120/60 | HR 76 | Ht 61.0 in | Wt 180.0 lb

## 2020-12-12 DIAGNOSIS — Z9861 Coronary angioplasty status: Secondary | ICD-10-CM | POA: Diagnosis not present

## 2020-12-12 DIAGNOSIS — K2270 Barrett's esophagus without dysplasia: Secondary | ICD-10-CM

## 2020-12-12 DIAGNOSIS — Z23 Encounter for immunization: Secondary | ICD-10-CM | POA: Diagnosis not present

## 2020-12-12 DIAGNOSIS — I2510 Atherosclerotic heart disease of native coronary artery without angina pectoris: Secondary | ICD-10-CM | POA: Diagnosis not present

## 2020-12-12 DIAGNOSIS — R7301 Impaired fasting glucose: Secondary | ICD-10-CM

## 2020-12-12 LAB — COMPLETE METABOLIC PANEL WITH GFR
AG Ratio: 1.9 (calc) (ref 1.0–2.5)
ALT: 11 U/L (ref 6–29)
AST: 12 U/L (ref 10–35)
Albumin: 4.2 g/dL (ref 3.6–5.1)
Alkaline phosphatase (APISO): 95 U/L (ref 37–153)
BUN: 11 mg/dL (ref 7–25)
CO2: 28 mmol/L (ref 20–32)
Calcium: 9.5 mg/dL (ref 8.6–10.4)
Chloride: 103 mmol/L (ref 98–110)
Creat: 0.88 mg/dL (ref 0.60–0.93)
GFR, Est African American: 77 mL/min/{1.73_m2} (ref >=60)
GFR, Est Non African American: 67 mL/min/{1.73_m2} (ref >=60)
Globulin: 2.2 g/dL (calc) (ref 1.9–3.7)
Glucose, Bld: 93 mg/dL (ref 65–139)
Potassium: 4.7 mmol/L (ref 3.5–5.3)
Sodium: 139 mmol/L (ref 135–146)
Total Bilirubin: 0.8 mg/dL (ref 0.2–1.2)
Total Protein: 6.4 g/dL (ref 6.1–8.1)

## 2020-12-12 LAB — LIPID PANEL W/REFLEX DIRECT LDL
Cholesterol: 149 mg/dL (ref <200)
HDL: 65 mg/dL (ref >=50)
LDL Cholesterol (Calc): 64 mg/dL (calc)
Non-HDL Cholesterol (Calc): 84 mg/dL (calc) (ref <130)
Total CHOL/HDL Ratio: 2.3 (calc) (ref <5.0)
Triglycerides: 112 mg/dL (ref <150)

## 2020-12-12 LAB — POCT GLYCOSYLATED HEMOGLOBIN (HGB A1C): Hemoglobin A1C: 6 % — AB (ref 4.0–5.6)

## 2020-12-12 MED ORDER — PANTOPRAZOLE SODIUM 40 MG PO TBEC: 40.0000 mg | DELAYED_RELEASE_TABLET | Freq: Every day | ORAL | 3 refills | Status: DC

## 2020-12-12 MED ORDER — METFORMIN HCL ER 500 MG PO TB24: ORAL_TABLET | ORAL | 1 refills | Status: DC

## 2020-12-12 NOTE — Assessment & Plan Note (Signed)
Asymptomatic.  Continue beta-blocker and statin.

## 2020-12-12 NOTE — Progress Notes (Signed)
Established Patient Office Visit  Subjective:  Patient ID: Rachel Carr, female    DOB: 20-Feb-1951  Age: 70 y.o. MRN: [id number]  CC:  Chief Complaint  Patient presents with  . ifg    HPI Rachel Carr presents for   Impaired fasting glucose-no increased thirst or urination. No symptoms consistent with hypoglycemia.  She is worked really hard to cut back on bread intake and eat a little bit more consistently.  F/ CAD -   No recent CP or SOB.    She is doing well and has actually lost about 8 pounds since she was last here in November.  She has been working on diet and trying to lose weight.  She has done well after left knee replacement.  She has been going to physical therapy during February and March and early April.  Past Medical History:  Diagnosis Date  . Barrett's syndrome   . CAD (coronary artery disease)   . Esophageal stricture   . Esophageal ulcer 09-2010   gastritis Dr. Glennon Hamilton  . Gastric polyps    colonoscopy 2012(due again for repeat 2017)  . Shingles    on the right eye    Past Surgical History:  Procedure Laterality Date  . CARPAL TUNNEL RELEASE    . CHOLECYSTECTOMY    . COMPLETE MASTECTOMY W/ SENTINEL NODE BIOPSY    . NISSEN FUNDOPLICATION  2979  . RADIOFREQUENCY ABLATION    . RT knee surgery    . TOTAL ABDOMINAL HYSTERECTOMY W/ BILATERAL SALPINGOOPHORECTOMY     endometriosis    Family History  Problem Relation Age of Onset  . Heart disease Mother   . Diabetes Father   . Cancer Father        prostrate  . Cancer Sister        esophagus  . Diabetes Sister   . Cancer Brother        kidney    Social History   Socioeconomic History  . Marital status: Married    Spouse name: Not on file  . Number of children: 1  . Years of education: Not on file  . Highest education level: Not on file  Occupational History  . Not on file  Tobacco Use  . Smoking status: Former Smoker    Packs/day: 0.50    Years: 20.00    Pack years: 10.00   . Smokeless tobacco: Never Used  . Tobacco comment: Quit in 2017  Vaping Use  . Vaping Use: Former  Substance and Sexual Activity  . Alcohol use: No  . Drug use: No  . 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  Other Topics Concern  . Not on file  Social History Narrative  . Not on file   Social Determinants of Health   Financial Resource Strain: Not on file  Food Insecurity: Not on file  Transportation Needs: Not on file  Physical Activity: Not on file  Stress: Not on file  Social Connections: Not on file  Intimate Partner Violence: Not on file    Outpatient Medications Prior to Visit  Medication Sig Dispense Refill  . albuterol (VENTOLIN HFA) 108 (90 Base) MCG/ACT inhaler INHALE 2 PUFFS INTO THE LUNGS EVERY 4 (FOUR) HOURS AS NEEDED FOR WHEEZING 18 g prn  . atorvastatin (LIPITOR) 80 MG tablet TAKE ONE TABLET (80 MG TOTAL) BY MOUTH AT BEDTIME. 90 tablet 3  . carvedilol (COREG) 6.25 MG tablet TAKE 1 TABLET TWICE A  DAY WITH FOOD 180 tablet 1  . [facility] ASPIRIN 81 MG chewable tablet Chew 1 tablet by mouth.  11  . GARLIC PO Take 2,836 mg by mouth daily.    . metFORMIN (GLUCOPHAGE-XR) 500 MG 24 hr tablet TAKE 1 TABLET BY MOUTH EVERY DAY WITH BREAKFAST 90 tablet 1  . pantoprazole (PROTONIX) 40 MG tablet Take 40 mg by mouth daily.     No facility-administered medications prior to visit.    Allergies  Allergen Reactions  . Tramadol Nausea Only    ROS Review of Systems    Objective:    Physical Exam Constitutional:      Appearance: She is well-developed.  HENT:     Head: Normocephalic and atraumatic.  Cardiovascular:     Rate and Rhythm: Normal rate and regular rhythm.     Heart sounds: Normal heart sounds.  Pulmonary:     Effort: Pulmonary effort is normal.     Breath sounds: Normal breath sounds.  Skin:    General: Skin is warm and dry.  Neurological:     Mental Status: She is alert and oriented to person, place, and  time.  Psychiatric:        Behavior: Behavior normal.     BP 120/60   Pulse 76   Ht 5\' 1"  (1.549 m)   Wt 180 lb (81.6 kg)   SpO2 100%   BMI 34.01 kg/m  Wt Readings from Last 3 Encounters:  12/12/20 180 lb (81.6 kg)  06/17/20 188 lb (85.3 kg)  06/12/20 187 lb (84.8 kg)     Health Maintenance Due  Topic Date Due  . COVID-19 Vaccine (1) Never done  . PNA vac Low Risk Adult (1 of 2 - PCV13) 11/22/2015    There are no preventive care reminders to display for this patient.  Lab Results  Component Value Date   TSH 0.79 10/25/2017   Lab Results  Component Value Date   WBC 6.9 09/09/2020   HGB 13.7 09/09/2020   HCT 40.5 09/09/2020   MCV 91.4 09/09/2020   PLT 266 09/09/2020   Lab Results  Component Value Date   NA 140 09/09/2020   K 4.2 09/09/2020   CO2 29 09/09/2020   GLUCOSE 91 09/09/2020   BUN 14 09/09/2020   CREATININE 0.80 09/09/2020   BILITOT 0.6 09/09/2020   ALKPHOS 94 09/29/2016   AST 10 09/09/2020   ALT 9 09/09/2020   PROT 6.6 09/09/2020   ALBUMIN 4.0 09/29/2016   CALCIUM 9.3 09/09/2020   Lab Results  Component Value Date   CHOL 146 09/18/2019   Lab Results  Component Value Date   HDL 68 09/18/2019   Lab Results  Component Value Date   LDLCALC 63 09/18/2019   Lab Results  Component Value Date   TRIG 69 09/18/2019   Lab Results  Component Value Date   CHOLHDL 2.1 09/18/2019   Lab Results  Component Value Date   HGBA1C 6.0 (A) 12/12/2020      Assessment & Plan:   Problem List Items Addressed This Visit      Cardiovascular and Mediastinum   CAD S/P percutaneous coronary angioplasty    Asymptomatic.  Continue beta-blocker and statin.      Relevant Orders   Lipid Panel w/reflex Direct LDL   COMPLETE METABOLIC PANEL WITH GFR     Digestive   Barrett's esophagus (Chronic)    Refilled Protonix 40 mg daily for 1 year.  Make sure keeping regularly follow-ups with  Dr. Newman Pies.        Endocrine   IFG (impaired fasting glucose) -  Primary    A1C is down to 6.0.  Encouraged her to continue to work at it she is made some really fantastic changes and is even down about 8 pounds.  If she is able to get her A1c down lower we might be able to even discontinue the metformin.      Relevant Medications   metFORMIN (GLUCOPHAGE-XR) 500 MG 24 hr tablet   Other Relevant Orders   POCT glycosylated hemoglobin (Hb A1C) (Completed)   Lipid Panel w/reflex Direct LDL   COMPLETE METABOLIC PANEL WITH GFR    Other Visit Diagnoses    Need for pneumococcal vaccine       Relevant Orders   Pneumococcal conjugate vaccine 20-valent (Prevnar 20) (Completed)     For pneumonia vaccine.  Given today.  Meds ordered this encounter  Medications  . metFORMIN (GLUCOPHAGE-XR) 500 MG 24 hr tablet    Sig: TAKE 1 TABLET BY MOUTH EVERY DAY WITH BREAKFAST    Dispense:  90 tablet    Refill:  1  . pantoprazole (PROTONIX) 40 MG tablet    Sig: Take 1 tablet (40 mg total) by mouth daily.    Dispense:  90 tablet    Refill:  3    Follow-up: Return in about 6 months (around 06/14/2021) for prediabetes and CAD.    Beatrice Lecher, MD

## 2020-12-12 NOTE — Assessment & Plan Note (Signed)
Refilled Protonix 40 mg daily for 1 year.  Make sure keeping regularly follow-ups with Dr. Newman Pies.

## 2020-12-12 NOTE — Assessment & Plan Note (Signed)
A1C is down to 6.0.  Encouraged her to continue to work at it she is made some really fantastic changes and is even down about 8 pounds.  If she is able to get her A1c down lower we might be able to even discontinue the metformin.

## 2020-12-16 NOTE — Progress Notes (Signed)
All labs are normal. 

## 2020-12-24 DIAGNOSIS — Z471 Aftercare following joint replacement surgery: Secondary | ICD-10-CM | POA: Diagnosis not present

## 2020-12-24 DIAGNOSIS — Z96652 Presence of left artificial knee joint: Secondary | ICD-10-CM | POA: Diagnosis not present

## 2021-01-10 ENCOUNTER — Other Ambulatory Visit: Payer: Self-pay

## 2021-01-10 ENCOUNTER — Emergency Department
Admission: EM | Admit: 2021-01-10 | Discharge: 2021-01-10 | Disposition: A | Discharge status: Home / Self Care | Payer: Medicare HMO | Source: Home / Self Care | Attending: Family Medicine | Admitting: Family Medicine

## 2021-01-10 DIAGNOSIS — H6122 Impacted cerumen, left ear: Secondary | ICD-10-CM

## 2021-01-10 DIAGNOSIS — H6692 Otitis media, unspecified, left ear: Secondary | ICD-10-CM

## 2021-01-10 DIAGNOSIS — J0100 Acute maxillary sinusitis, unspecified: Secondary | ICD-10-CM | POA: Diagnosis not present

## 2021-01-10 DIAGNOSIS — H8110 Benign paroxysmal vertigo, unspecified ear: Secondary | ICD-10-CM

## 2021-01-10 MED ORDER — ONDANSETRON 4 MG PO TBDP
4.0000 mg | ORAL_TABLET | Freq: Once | ORAL | Status: AC
  Administered 2021-01-10: 4 mg via ORAL

## 2021-01-10 MED ORDER — PREDNISONE 20 MG PO TABS: ORAL_TABLET | ORAL | 0 refills | Status: DC

## 2021-01-10 MED ORDER — AMOXICILLIN 875 MG PO TABS: 875.0000 mg | ORAL_TABLET | Freq: Two times a day (BID) | ORAL | 0 refills | Status: DC

## 2021-01-10 MED ORDER — MECLIZINE HCL 25 MG PO TABS: 25.0000 mg | ORAL_TABLET | Freq: Three times a day (TID) | ORAL | 0 refills | Status: DC | PRN

## 2021-01-10 NOTE — Discharge Instructions (Signed)
Followup with ENT physician if symptoms become worse.

## 2021-01-10 NOTE — ED Triage Notes (Signed)
St she thinks she may have an eat infection, pt st she has bilateral ear pain, facial pain and dizziness for one week. Pt st she has been nauseated and unable to keep food down. Pt has not tried otc medication. Pt st she tried to irrigate her ears last night with no success.

## 2021-01-10 NOTE — ED Provider Notes (Signed)
[facility]    CSN: [id number] Arrival date & time: 01/10/21  0831      History   Chief Complaint Chief Complaint  Patient presents with  . Otalgia  . Dizziness    HPI Rachel Carr is a 70 y.o. female.   Patient complains of bilateral ear pressure (worse on the left), sinus congestion, and facial pain for two weeks.  During the past three days she has had mild vertigo with head movement and occasional vomiting.  She denies fever and URI symptoms. She tried to irrigate her ears last night without improvement.  The history is provided by the patient.    Past Medical History:  Diagnosis Date  . Barrett's syndrome   . CAD (coronary artery disease)   . Esophageal stricture   . Esophageal ulcer 09-2010   gastritis Dr. Glennon Hamilton  . Gastric polyps    colonoscopy 2012(due again for repeat 2017)  . Shingles    on the right eye    Patient Active Problem List   Diagnosis Date Noted  . Bilateral primary osteoarthritis of knee 11/26/2016  . CAD S/P percutaneous coronary angioplasty 09/29/2016  . Unstable angina ([place]) 02/27/2016  . IFG (impaired fasting glucose) 11/20/2015  . 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 Regurgitation 12/10/2011  . Obese 10/14/2011  . Barrett's esophagus 01/13/2011  . Tobacco use disorder 01/02/2011  . COLONIC POLYPS 10/10/2010  . ULCER OF ESOPHAGUS WITHOUT BLEEDING 10/10/2010  . GERD 08/15/2010  . 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 CARPAL TUNNEL RELEASE    . CHOLECYSTECTOMY    . COMPLETE MASTECTOMY W/ SENTINEL NODE BIOPSY    . JOINT REPLACEMENT    . NISSEN FUNDOPLICATION  9937  . RADIOFREQUENCY ABLATION    . RT knee surgery    . TOTAL ABDOMINAL HYSTERECTOMY W/ BILATERAL SALPINGOOPHORECTOMY     endometriosis    OB History   No obstetric  history on file.      Home Medications    Prior to Admission medications   Medication Sig Start Date End Date Taking? Authorizing Provider  amoxicillin (AMOXIL) 875 MG tablet Take 1 tablet (875 mg total) by mouth 2 (two) times daily. 01/10/21  Yes Kandra Nicolas, MD  meclizine (ANTIVERT) 25 MG tablet Take 1 tablet (25 mg total) by mouth 3 (three) times daily as needed for dizziness. 01/10/21  Yes Kandra Nicolas, MD  predniSONE (DELTASONE) 20 MG tablet Take one tab by mouth twice daily for 4 days, then one daily for 3 days. Take with food. 01/10/21  Yes Kandra Nicolas, MD  albuterol (VENTOLIN HFA) 108 (90 Base) MCG/ACT inhaler INHALE 2 PUFFS INTO THE LUNGS EVERY 4 (FOUR) HOURS AS NEEDED FOR WHEEZING 02/14/19   Hali Marry, MD  atorvastatin (LIPITOR) 80 MG tablet TAKE ONE TABLET (80 MG TOTAL) BY MOUTH AT BEDTIME. 03/18/20   Hali Marry, MD  carvedilol (COREG) 6.25 MG tablet TAKE 1 TABLET TWICE A DAY WITH FOOD 12/02/20   Hali Marry, MD  [facility] [facility] ASPIRIN 81 MG chewable tablet Chew 1 tablet by mouth. 02/28/16   [provider]  GARLIC PO Take 1,696 mg by mouth daily.    [provider]  metFORMIN (GLUCOPHAGE-XR) 500 MG 24 hr tablet TAKE 1 TABLET BY MOUTH EVERY DAY WITH BREAKFAST 12/12/20   Hali Marry,  MD  pantoprazole (PROTONIX) 40 MG tablet Take 1 tablet (40 mg total) by mouth daily. 12/12/20   Hali Marry, MD    Family History Family History  Problem Relation Age of Onset  . Heart disease Mother   . Diabetes Father   . Cancer Father        prostrate  . Cancer Sister        esophagus  . Diabetes Sister   . Cancer Brother        kidney    Social History Social History   Tobacco Use  . Smoking status: Former Smoker    Packs/day: 0.50    Years: 20.00    Pack years: 10.00  . Smokeless tobacco: Never Used  . Tobacco comment: Quit in 2017  Vaping Use  . Vaping Use: Former  Substance Use Topics  . Alcohol use: No   . Drug use: No     Allergies   Tramadol   Review of Systems Review of Systems No sore throat No cough No pleuritic pain No wheezing + nasal congestion ? post-nasal drainage + sinus pain/pressure No itchy/red eyes + earache + dizziness No hemoptysis No SOB No fever/chills + nausea + vomiting No abdominal pain No diarrhea No urinary symptoms No skin rash + fatigue No myalgias No headache   Physical Exam Triage Vital Signs ED Triage Vitals  Enc Vitals Group     BP 01/10/21 0843 121/83     Pulse Rate 01/10/21 0843 75     Resp 01/10/21 0843 18     Temp 01/10/21 0843 (!) 97.5 F (36.4 C)     Temp Source 01/10/21 0843 Oral     SpO2 01/10/21 0843 98 %     Weight --      Height --      Head Circumference --      Peak Flow --      Pain Score 01/10/21 0841 8     Pain Loc --      Pain Edu? --      Excl. in [place]? --    No data found.  Updated Vital Signs BP 121/83 (BP Location: Right Arm)   Pulse 75   Temp (!) 97.5 F (36.4 C) (Oral)   Resp 18   SpO2 98%   Visual Acuity Right Eye Distance:   Left Eye Distance:   Bilateral Distance:    Right Eye Near:   Left Eye Near:    Bilateral Near:     Physical Exam Vitals and nursing note reviewed.  Constitutional:      General: She is not in acute distress. HENT:     Head: Normocephalic.     Right Ear: External ear normal.     Left Ear: External ear normal. There is impacted cerumen.     Ears:     Comments: Right tympanic membrane appears scarred and somewhat retracted.  Left canal is almost completely occluded; unable to fully visualize the left tympanic membrane.    Nose: Congestion present.     Mouth/Throat:     Pharynx: Oropharynx is clear.  Eyes:     Conjunctiva/sclera: Conjunctivae normal.     Pupils: Pupils are equal, round, and reactive to light.  Cardiovascular:     Rate and Rhythm: Normal rate.  Pulmonary:     Effort: Pulmonary effort is normal.  Lymphadenopathy:     Cervical: No cervical  adenopathy.  Skin:    General: Skin is warm and dry.  Neurological:     Mental Status: She is alert and oriented to person, place, and time.      UC Treatments / Results  Labs (all labs ordered are listed, but only abnormal results are displayed) Labs Reviewed -   Tympanometry:  Right ear tympanogram: unable to obtain.  Left ear tympanogram: low peak height, normal ear volume.  EKG   Radiology No results found.  Procedures Procedures (including critical care time)  Medications Ordered in UC Medications  ondansetron (ZOFRAN-ODT) disintegrating tablet 4 mg (4 mg Oral Given 01/10/21 0848)    Initial Impression / Assessment and Plan / UC Course  I have reviewed the triage vital signs and the nursing notes.  Pertinent labs & imaging results that were available during my care of the patient were reviewed by me and considered in my medical decision making (see chart for details).    Although left canal is mostly occluded with cerumen, tympanogram is suggestive of otitis media. Begin amoxicillin and prednisone burst/taper. Rx for Antivert. Followup with ENT for cerumen removal.   Final Clinical Impressions(s) / UC Diagnoses   Final diagnoses:  Acute maxillary sinusitis, recurrence not specified  Acute left otitis media  Impacted cerumen of left ear  Benign paroxysmal positional vertigo, unspecified laterality     Discharge Instructions     Followup with ENT physician if symptoms become worse.    ED Prescriptions    Medication Sig Dispense Auth. Provider   amoxicillin (AMOXIL) 875 MG tablet Take 1 tablet (875 mg total) by mouth 2 (two) times daily. 20 tablet Kandra Nicolas, MD   predniSONE (DELTASONE) 20 MG tablet Take one tab by mouth twice daily for 4 days, then one daily for 3 days. Take with food. 11 tablet Kandra Nicolas, MD   meclizine (ANTIVERT) 25 MG tablet Take 1 tablet (25 mg total) by mouth 3 (three) times daily as needed for dizziness. 12 tablet Kandra Nicolas, MD        Kandra Nicolas, MD 01/11/21 [phone]

## 2021-02-11 ENCOUNTER — Telehealth: Payer: Self-pay | Admitting: Family Medicine

## 2021-02-11 NOTE — Progress Notes (Signed)
  Chronic Care Management   Outreach Note  02/11/2021 Name: Rachel Carr MRN: [id number] DOB: 1951/03/08  Referred by: Hali Marry, MD Reason for referral : No chief complaint on file.   An unsuccessful telephone outreach was attempted today. The patient was referred to the pharmacist for assistance with care management and care coordination.   Follow Up Plan:   Lauretta Grill Upstream Scheduler

## 2021-02-12 ENCOUNTER — Ambulatory Visit (INDEPENDENT_AMBULATORY_CARE_PROVIDER_SITE_OTHER): Payer: Medicare HMO | Admitting: Family Medicine

## 2021-02-12 DIAGNOSIS — Z0000 Encounter for general adult medical examination without abnormal findings: Secondary | ICD-10-CM

## 2021-02-12 DIAGNOSIS — Z1231 Encounter for screening mammogram for malignant neoplasm of breast: Secondary | ICD-10-CM

## 2021-02-12 DIAGNOSIS — Z122 Encounter for screening for malignant neoplasm of respiratory organs: Secondary | ICD-10-CM

## 2021-02-12 DIAGNOSIS — Z87891 Personal history of nicotine dependence: Secondary | ICD-10-CM | POA: Diagnosis not present

## 2021-02-12 NOTE — Patient Instructions (Addendum)
[place] Maintenance Summary and Written Plan of Care  Ms. Rachel Carr ,  Thank you for allowing me to perform your Medicare Annual Wellness Visit and for your ongoing commitment to your health.   Health Maintenance & Immunization History Health Maintenance  Topic Date Due   COVID-19 Vaccine (1) 02/28/2021 (Originally 11/22/1955)   Zoster Vaccines- Shingrix (1 of 2) 05/15/2021 (Originally 11/21/2000)   PNA vac Low Risk Adult (1 of 2 - PCV13) 02/12/2022 (Originally 11/22/2015)   INFLUENZA VACCINE  03/10/2021   MAMMOGRAM  11/01/2021   DEXA SCAN  11/04/2022   COLONOSCOPY (Pts 45-99yrs Insurance coverage will need to be confirmed)  06/07/2025   TETANUS/TDAP  11/01/2029   Hepatitis C Screening  Completed   HPV VACCINES  Aged Out   Immunization History  Administered Date(s) Administered   Fluad Quad(high Dose 65+) 06/12/2020   Influenza, High Dose Seasonal PF 05/03/2019   PNEUMOCOCCAL CONJUGATE-20 12/12/2020   Tdap 11/02/2019    These are the patient goals that we discussed:  Goals Addressed               This Visit's Progress     Patient Stated (pt-stated)        02/12/2021 AWV Goal: Exercise for General Health  Patient will verbalize understanding of the benefits of increased physical activity: Exercising regularly is important. It will improve your overall fitness, flexibility, and endurance. Regular exercise also will improve your overall health. It can help you control your weight, reduce stress, and improve your bone density. Over the next year, patient will increase physical activity as tolerated with a goal of at least 150 minutes of moderate physical activity per week.  You can tell that you are exercising at a moderate intensity if your heart starts beating faster and you start breathing faster but can still hold a conversation. Moderate-intensity exercise ideas include: Walking 1 mile (1.6 km) in about 15  minutes Biking Hiking Golfing Dancing Water aerobics Patient will verbalize understanding of everyday activities that increase physical activity by providing examples like the following: Yard work, such as: Sales promotion account executive Gardening Washing windows or floors Patient will be able to explain general safety guidelines for exercising:  Before you start a new exercise program, talk with your health care provider. Do not exercise so much that you hurt yourself, feel dizzy, or get very short of breath. Wear comfortable clothes and wear shoes with good support. Drink plenty of water while you exercise to prevent dehydration or heat stroke. Work out until your breathing and your heartbeat get faster.           This is a list of Health Maintenance Items that are overdue or due now: Screening mammography Shingrix vaccine Covid vaccine  Orders/Referrals Placed Today: Orders Placed This Encounter  Procedures   Mammogram 3D SCREEN BREAST BILATERAL    Standing Status:   Future    Standing Expiration Date:   02/12/2022    Scheduling Instructions:     Please call patient to schedule.    Order Specific Question:   Reason for Exam (SYMPTOM  OR DIAGNOSIS REQUIRED)    Answer:   Breast cancer screening    Order Specific Question:   Preferred imaging location?    Answer:   [facility]   Ambulatory Referral for Lung Cancer Scre    Referral Priority:   Routine    Referral Type:  Consultation    Referral Reason:   Specialty Services Required    Number of Visits Requested:   1   (Contact our referral department at [phone] if you have not spoken with someone about your referral appointment within the next 5 days)    Follow-up Plan Follow-up with Hali Marry, MD as planned Referral for your mammogram and lung cancer screening has been sent and they will call you to schedule.  Medicare  wellness visit in one year. Patient stated that she will review her AVS on my chart.     Health Maintenance, Female Adopting a healthy lifestyle and getting preventive care are important in promoting health and wellness. Ask your health care provider about: The right schedule for you to have regular tests and exams. Things you can do on your own to prevent diseases and keep yourself healthy. What should I know about diet, weight, and exercise? Eat a healthy diet  Eat a diet that includes plenty of vegetables, fruits, low-fat dairy products, and lean protein. Do not eat a lot of foods that are high in solid fats, added sugars, or sodium.  Maintain a healthy weight Body mass index (BMI) is used to identify weight problems. It estimates body fat based on height and weight. Your health care provider can help determineyour BMI and help you achieve or maintain a healthy weight. Get regular exercise Get regular exercise. This is one of the most important things you can do for your health. Most adults should: Exercise for at least 150 minutes each week. The exercise should increase your heart rate and make you sweat (moderate-intensity exercise). Do strengthening exercises at least twice a week. This is in addition to the moderate-intensity exercise. Spend less time sitting. Even light physical activity can be beneficial. Watch cholesterol and blood lipids Have your blood tested for lipids and cholesterol at 70 years of age, then havethis test every 5 years. Have your cholesterol levels checked more often if: Your lipid or cholesterol levels are high. You are older than 70 years of age. You are at high risk for heart disease. What should I know about cancer screening? Depending on your health history and family history, you may need to have cancer screening at various ages. This may include screening for: Breast cancer. Cervical cancer. Colorectal cancer. Skin cancer. Lung cancer. What  should I know about heart disease, diabetes, and high blood pressure? Blood pressure and heart disease High blood pressure causes heart disease and increases the risk of stroke. This is more likely to develop in people who have high blood pressure readings, are of African descent, or are overweight. Have your blood pressure checked: Every 3-5 years if you are 30-43 years of age. Every year if you are 69 years old or older. Diabetes Have regular diabetes screenings. This checks your fasting blood sugar level. Have the screening done: Once every three years after age 48 if you are at a normal weight and have a low risk for diabetes. More often and at a younger age if you are overweight or have a high risk for diabetes. What should I know about preventing infection? Hepatitis B If you have a higher risk for hepatitis B, you should be screened for this virus. Talk with your health care provider to find out if you are at risk forhepatitis B infection. Hepatitis C Testing is recommended for: Everyone born from [birth year] through 1965. Anyone with known risk factors for hepatitis C. Sexually transmitted infections (STIs) Get screened  for STIs, including gonorrhea and chlamydia, if: You are sexually active and are younger than 70 years of age. You are older than 70 years of age and your health care provider tells you that you are at risk for this type of infection. Your sexual activity has changed since you were last screened, and you are at increased risk for chlamydia or gonorrhea. Ask your health care provider if you are at risk. Ask your health care provider about whether you are at high risk for HIV. Your health care provider may recommend a prescription medicine to help prevent HIV infection. If you choose to take medicine to prevent HIV, you should first get tested for HIV. You should then be tested every 3 months for as long as you are taking the medicine. Pregnancy If you are about to stop having  your period (premenopausal) and you may become pregnant, seek counseling before you get pregnant. Take 400 to 800 micrograms (mcg) of folic acid every day if you become pregnant. Ask for birth control (contraception) if you want to prevent pregnancy. Osteoporosis and menopause Osteoporosis is a disease in which the bones lose minerals and strength with aging. This can result in bone fractures. If you are 25 years old or older, or if you are at risk for osteoporosis and fractures, ask your health care provider if you should: Be screened for bone loss. Take a calcium or vitamin D supplement to lower your risk of fractures. Be given hormone replacement therapy (HRT) to treat symptoms of menopause. Follow these instructions at home: Lifestyle Do not use any products that contain nicotine or tobacco, such as cigarettes, e-cigarettes, and chewing tobacco. If you need help quitting, ask your health care provider. Do not use street drugs. Do not share needles. Ask your health care provider for help if you need support or information about quitting drugs. Alcohol use Do not drink alcohol if: Your health care provider tells you not to drink. You are pregnant, may be pregnant, or are planning to become pregnant. If you drink alcohol: Limit how much you use to 0-1 drink a day. Limit intake if you are breastfeeding. Be aware of how much alcohol is in your drink. In the [place] one drink equals one 12 oz bottle of beer (355 mL), one 5 oz glass of wine (148 mL), or one 1 oz glass of hard liquor (44 mL). General instructions Schedule regular health, dental, and eye exams. Stay current with your vaccines. Tell your health care provider if: You often feel depressed. You have ever been abused or do not feel safe at home. Summary Adopting a healthy lifestyle and getting preventive care are important in promoting health and wellness. Follow your health care provider's instructions about healthy diet,  exercising, and getting tested or screened for diseases. Follow your health care provider's instructions on monitoring your cholesterol and blood pressure. This information is not intended to replace advice given to you by your health care provider. Make sure you discuss any questions you have with your healthcare provider. Document Revised: 07/20/2018 Document Reviewed: 07/20/2018 Elsevier Patient Education  2022 [employer].

## 2021-02-12 NOTE — Progress Notes (Signed)
[place] VISIT  02/12/2021  Telephone Visit Disclaimer This Medicare AWV was conducted by telephone due to national recommendations for restrictions regarding the COVID-19 Pandemic (e.g. social distancing).  I verified, using two identifiers, that I am speaking with Rachel Carr or their authorized healthcare agent. I discussed the limitations, risks, security, and privacy concerns of performing an evaluation and management service by telephone and the potential availability of an in-person appointment in the future. The patient expressed understanding and agreed to proceed.  Location of Patient: Home Location of Provider (nurse):  In the office.  Subjective:    Rachel Carr is a 70 y.o. female patient of Metheney, Rene Kocher, MD who had a Medicare Annual Wellness Visit today via telephone. Rachel Carr is Retired and lives with their spouse. she has 1 child. she reports that she is socially active and does interact with friends/family regularly. she is minimally physically active and enjoys gardening.  Patient Care Team: Hali Marry, MD as PCP - General (Family Medicine) Stanford Breed Denice Bors, MD as PCP - Cardiology (Cardiology) Rosana Berger, MD as Referring Physician (Gastroenterology)  Advanced Directives 02/12/2021 12/02/2016 08/30/2014  Does Patient Have a Medical Advance Directive? No No No  Would patient like information on creating a medical advance directive? No - Patient declined No - Patient declined Yes - Educational materials given    Hospital Utilization Over the Past 12 Months: # of hospitalizations or ER visits: 0 # of surgeries: 1  Review of Systems    Patient reports that her overall health is better compared to last year.  History obtained from chart review and the patient  Patient Reported Readings (BP, Pulse, CBG, Weight, etc) none  Pain Assessment Pain : No/denies pain     Current Medications & Allergies  (verified) Allergies as of 02/12/2021       Reactions   Tramadol Nausea Only        Medication List        Accurate as of February 12, 2021 10:12 AM. If you have any questions, ask your nurse or doctor.          albuterol 108 (90 Base) MCG/ACT inhaler Commonly known as: VENTOLIN HFA INHALE 2 PUFFS INTO THE LUNGS EVERY 4 (FOUR) HOURS AS NEEDED FOR WHEEZING   amoxicillin 875 MG tablet Commonly known as: AMOXIL Take 1 tablet (875 mg total) by mouth 2 (two) times daily.   atorvastatin 80 MG tablet Commonly known as: LIPITOR TAKE ONE TABLET (80 MG TOTAL) BY MOUTH AT BEDTIME.   carvedilol 6.25 MG tablet Commonly known as: COREG TAKE 1 TABLET TWICE A DAY WITH FOOD   [facility] Childrens Aspirin 81 MG chewable tablet Generic drug: aspirin Chew 1 tablet by mouth.   GARLIC PO Take 9,562 mg by mouth daily.   meclizine 25 MG tablet Commonly known as: ANTIVERT Take 1 tablet (25 mg total) by mouth 3 (three) times daily as needed for dizziness.   metFORMIN 500 MG 24 hr tablet Commonly known as: GLUCOPHAGE-XR TAKE 1 TABLET BY MOUTH EVERY DAY WITH BREAKFAST   pantoprazole 40 MG tablet Commonly known as: PROTONIX Take 1 tablet (40 mg total) by mouth daily.   predniSONE 20 MG tablet Commonly known as: DELTASONE Take one tab by mouth twice daily for 4 days, then one daily for 3 days. Take with food.        History (reviewed): Past Medical History:  Diagnosis Date   Barrett's syndrome    CAD (  coronary artery disease)    Esophageal stricture    Esophageal ulcer 09-2010   gastritis Dr. Glennon Hamilton   Gastric polyps    colonoscopy 2012(due again for repeat 2017)   Shingles    on the right eye   Past Surgical History:  Procedure Laterality Date   CARPAL TUNNEL RELEASE     CHOLECYSTECTOMY     COMPLETE MASTECTOMY W/ SENTINEL NODE BIOPSY     JOINT REPLACEMENT     NISSEN FUNDOPLICATION  5277   RADIOFREQUENCY ABLATION     RT knee surgery     TOTAL ABDOMINAL HYSTERECTOMY W/ BILATERAL  SALPINGOOPHORECTOMY     endometriosis   Family History  Problem Relation Age of Onset   Heart disease Mother    Diabetes Father    Cancer Father        prostrate   Cancer Sister        esophagus   Diabetes Sister    Cancer Brother        kidney   Social History   Socioeconomic History   Marital status: Married    Spouse name: Rachel Carr   Number of children: 1   Years of education: 12   Highest education level: High school graduate  Occupational History    Comment: Retired.  Tobacco Use   Smoking status: Former    Packs/day: 0.50    Years: 20.00    Pack years: 10.00    Types: Cigarettes    Quit date: 2017    Years since quitting: 5.5   Smokeless tobacco: Never  Vaping Use   Vaping Use: Former  Substance and Sexual Activity   Alcohol use: Yes    Comment: occasionally   Drug use: No   Sexual activity: Not on file  Other Topics Concern   Not on file  Social History Narrative   Lives with her husband. She has one child. She enjoys gardening and working outside.   Social Determinants of Health   Carr Resource Strain: Low Risk    Difficulty of Paying Living Expenses: Not hard at all  Food Insecurity: No Food Insecurity   Worried About Charity fundraiser in the Last Year: Never true   [place] in the Last Year: Never true  Transportation Needs: No Transportation Needs   Lack of Transportation (Medical): No   Lack of Transportation (Non-Medical): No  Physical Activity: Inactive   Days of Exercise per Week: 0 days   Minutes of Exercise per Session: 0 min  Stress: No Stress Concern Present   Feeling of Stress : Not at all  Social Connections: Moderately Isolated   Frequency of Communication with Friends and Family: More than three times a week   Frequency of Social Gatherings with Friends and Family: Once a week   Attends Religious Services: Never   Marine scientist or Organizations: No   Attends Archivist Meetings: Never   Marital  Status: Married    Activities of Daily Living In your present state of health, do you have any difficulty performing the following activities: 02/12/2021  Hearing? N  Vision? Y  Comment mild cataracts  Difficulty concentrating or making decisions? Y  Comment some issues with rememering things  Walking or climbing stairs? Y  Comment knee pain due to recent surgery  Dressing or bathing? N  Doing errands, shopping? N  Preparing Food and eating ? N  Using the Toilet? N  In the past six months, have you accidently  leaked urine? N  Do you have problems with loss of bowel control? N  Managing your Medications? N  Managing your Finances? N  Housekeeping or managing your Housekeeping? N  Some recent data might be hidden    Patient Education/ Literacy How often do you need to have someone help you when you read instructions, pamphlets, or other written materials from your doctor or pharmacy?: 1 - Never What is the last grade level you completed in school?: 12th grade.  Exercise Current Exercise Habits: Home exercise routine, Type of exercise: walking, Time (Minutes): 30, Frequency (Times/Week): >7, Weekly Exercise (Minutes/Week): 0, Intensity: Mild, Exercise limited by: None identified  Diet Patient reports consuming 3 meals a day and 0 snack(s) a day Patient reports that her primary diet is: Regular Patient reports that she does have regular access to food.   Depression Screen PHQ 2/9 Scores 02/12/2021 06/12/2020 03/23/2019 10/25/2017 04/02/2017 03/26/2016  PHQ - 2 Score 0 0 0 0 2 0     Fall Risk Fall Risk  02/12/2021 06/12/2020 03/23/2019 10/25/2017 04/02/2017  Falls in the past year? 0 0 0 No No  Number falls in past yr: 0 - 0 - -  Injury with Fall? 0 - 0 - -  Risk for fall due to : No Fall Risks No Fall Risks - - -  Follow up Falls evaluation completed - - - -     Objective:  Rachel Carr seemed alert and oriented and she participated appropriately during our telephone  visit.  Blood Pressure Weight BMI  BP Readings from Last 3 Encounters:  01/10/21 121/83  12/12/20 120/60  06/12/20 121/64   Wt Readings from Last 3 Encounters:  12/12/20 180 lb (81.6 kg)  06/17/20 188 lb (85.3 kg)  06/12/20 187 lb (84.8 kg)   BMI Readings from Last 1 Encounters:  12/12/20 34.01 kg/m    *Unable to obtain current vital signs, weight, and BMI due to telephone visit type  Hearing/Vision  Rachel Carr did not seem to have difficulty with hearing/understanding during the telephone conversation Reports that she has had a formal eye exam by an eye care professional within the past year Reports that she has had a formal hearing evaluation within the past year *Unable to fully assess hearing and vision during telephone visit type  Cognitive Function: 6CIT Screen 02/12/2021  What Year? 0 points  What month? 0 points  What time? 0 points  Count back from 20 0 points  Months in reverse 0 points  Repeat phrase 0 points  Total Score 0   (Normal:0-7, Significant for Dysfunction: >8)  Normal Cognitive Function Screening: Yes   Immunization & Health Maintenance Record Immunization History  Administered Date(s) Administered   Fluad Quad(high Dose 65+) 06/12/2020   Influenza, High Dose Seasonal PF 05/03/2019   PNEUMOCOCCAL CONJUGATE-20 12/12/2020   Tdap 11/02/2019    Health Maintenance  Topic Date Due   COVID-19 Vaccine (1) 02/28/2021 (Originally 11/22/1955)   Zoster Vaccines- Shingrix (1 of 2) 05/15/2021 (Originally 11/21/2000)   PNA vac Low Risk Adult (1 of 2 - PCV13) 02/12/2022 (Originally 11/22/2015)   INFLUENZA VACCINE  03/10/2021   MAMMOGRAM  11/01/2021   DEXA SCAN  11/04/2022   COLONOSCOPY (Pts 45-67yrs Insurance coverage will need to be confirmed)  06/07/2025   TETANUS/TDAP  11/01/2029   Hepatitis C Screening  Completed   HPV VACCINES  Aged Out       Assessment  This is a routine wellness examination for Rachel Carr.  Health Maintenance: Due or  Overdue There are no preventive care reminders to display for this patient.   Rachel Carr does not need a referral for Community Assistance: Care Management:   no Social Work:    no Prescription Assistance:  no Nutrition/Diabetes Education:  no   Plan:  Personalized Goals  Goals Addressed               This Visit's Progress     Patient Stated (pt-stated)        02/12/2021 AWV Goal: Exercise for General Health  Patient will verbalize understanding of the benefits of increased physical activity: Exercising regularly is important. It will improve your overall fitness, flexibility, and endurance. Regular exercise also will improve your overall health. It can help you control your weight, reduce stress, and improve your bone density. Over the next year, patient will increase physical activity as tolerated with a goal of at least 150 minutes of moderate physical activity per week.  You can tell that you are exercising at a moderate intensity if your heart starts beating faster and you start breathing faster but can still hold a conversation. Moderate-intensity exercise ideas include: Walking 1 mile (1.6 km) in about 15 minutes Biking Hiking Golfing Dancing Water aerobics Patient will verbalize understanding of everyday activities that increase physical activity by providing examples like the following: Yard work, such as: Sales promotion account executive Gardening Washing windows or floors Patient will be able to explain general safety guidelines for exercising:  Before you start a new exercise program, talk with your health care provider. Do not exercise so much that you hurt yourself, feel dizzy, or get very short of breath. Wear comfortable clothes and wear shoes with good support. Drink plenty of water while you exercise to prevent dehydration or heat stroke. Work out until your breathing and your  heartbeat get faster.         Personalized Health Maintenance & Screening Recommendations  Screening mammography Shingrix vaccine Covid vaccine  Patient declined the vaccines at this time.   Lung Cancer Screening Recommended: yes (Low Dose CT Chest recommended if Age 24-80 years, 30 pack-year currently smoking OR have quit w/in past 15 years) Hepatitis C Screening recommended: no HIV Screening recommended: no  Advanced Directives: Written information was not prepared per patient's request.  Referrals & Orders Orders Placed This Encounter  Procedures   Mammogram 3D [place]   Ambulatory Referral for Lung Cancer Scre     Follow-up Plan Follow-up with Hali Marry, MD as planned Referral for your mammogram and lung cancer screening has been sent and they will call you to schedule.  Medicare wellness visit in one year. Patient stated that she will review her AVS on my chart.   I have personally reviewed and noted the following in the patient's chart:   Medical and social history Use of alcohol, tobacco or illicit drugs  Current medications and supplements Functional ability and status Nutritional status Physical activity Advanced directives List of other physicians Hospitalizations, surgeries, and ER visits in previous 12 months Vitals Screenings to include cognitive, depression, and falls Referrals and appointments  In addition, I have reviewed and discussed with Rachel Carr certain preventive protocols, quality metrics, and best practice recommendations. A written personalized care plan for preventive services as well as general preventive health recommendations is available and can be mailed to the patient at her request.  Tinnie Gens, RN  02/12/2021

## 2021-02-18 ENCOUNTER — Telehealth: Payer: Self-pay | Admitting: Family Medicine

## 2021-02-18 NOTE — Progress Notes (Signed)
  Chronic Care Management   Outreach Note  02/18/2021 Name: Rachel Carr MRN: [id number] DOB: 03/10/1951  Referred by: Hali Marry, MD Reason for referral : No chief complaint on file.   A second unsuccessful telephone outreach was attempted today. The patient was referred to pharmacist for assistance with care management and care coordination.  Follow Up Plan:   Lauretta Grill Upstream Scheduler

## 2021-03-03 ENCOUNTER — Telehealth: Payer: Self-pay | Admitting: Family Medicine

## 2021-03-03 NOTE — Chronic Care Management (AMB) (Signed)
  Chronic Care Management   Outreach Note  03/03/2021 Name: Rachel Carr MRN: [id number] DOB: [birth year]  Referred by: Hali Marry, MD Reason for referral : No chief complaint on file.   Third unsuccessful telephone outreach was attempted today. The patient was referred to the pharmacist for assistance with care management and care coordination.   Follow Up Plan:   Lauretta Grill Upstream Scheduler

## 2021-03-12 ENCOUNTER — Ambulatory Visit (INDEPENDENT_AMBULATORY_CARE_PROVIDER_SITE_OTHER): Payer: Medicare HMO

## 2021-03-12 ENCOUNTER — Other Ambulatory Visit: Payer: Self-pay

## 2021-03-12 DIAGNOSIS — Z0000 Encounter for general adult medical examination without abnormal findings: Secondary | ICD-10-CM

## 2021-03-12 DIAGNOSIS — Z1231 Encounter for screening mammogram for malignant neoplasm of breast: Secondary | ICD-10-CM

## 2021-03-25 ENCOUNTER — Telehealth: Payer: Self-pay | Admitting: Lab

## 2021-03-25 NOTE — Progress Notes (Signed)
  Chronic Care Management   Outreach Note  03/25/2021 Name: Rachel Carr MRN: [id number] DOB: 1951-03-18  Referred by: Hali Marry, MD Reason for referral : Medication Management   An unsuccessful telephone outreach was attempted today. The patient was referred to the pharmacist for assistance with care management and care coordination.   Follow Up Plan:   [place]

## 2021-04-03 ENCOUNTER — Other Ambulatory Visit: Payer: Self-pay | Admitting: Family Medicine

## 2021-04-03 ENCOUNTER — Other Ambulatory Visit: Payer: Self-pay | Admitting: *Deleted

## 2021-04-03 DIAGNOSIS — Z87891 Personal history of nicotine dependence: Secondary | ICD-10-CM

## 2021-04-07 DIAGNOSIS — Z85828 Personal history of other malignant neoplasm of skin: Secondary | ICD-10-CM | POA: Diagnosis not present

## 2021-04-07 DIAGNOSIS — L821 Other seborrheic keratosis: Secondary | ICD-10-CM | POA: Diagnosis not present

## 2021-04-09 ENCOUNTER — Telehealth: Payer: Self-pay | Admitting: Lab

## 2021-04-09 NOTE — Progress Notes (Signed)
  Chronic Care Management   Outreach Note  04/09/2021 Name: Rachel Carr MRN: [id number] DOB: May 13, 1951  Referred by: Hali Marry, MD Reason for referral : Medication Management   Third unsuccessful telephone outreach was attempted today. The patient was referred to the pharmacist for assistance with care management and care coordination.   Follow Up Plan:   [place]

## 2021-04-24 ENCOUNTER — Other Ambulatory Visit: Payer: Self-pay

## 2021-04-24 ENCOUNTER — Encounter: Payer: Self-pay | Admitting: Pulmonary Disease

## 2021-04-24 ENCOUNTER — Ambulatory Visit (INDEPENDENT_AMBULATORY_CARE_PROVIDER_SITE_OTHER): Payer: Medicare HMO

## 2021-04-24 ENCOUNTER — Telehealth (INDEPENDENT_AMBULATORY_CARE_PROVIDER_SITE_OTHER): Payer: Medicare HMO | Admitting: Pulmonary Disease

## 2021-04-24 DIAGNOSIS — Z122 Encounter for screening for malignant neoplasm of respiratory organs: Secondary | ICD-10-CM

## 2021-04-24 DIAGNOSIS — Z87891 Personal history of nicotine dependence: Secondary | ICD-10-CM

## 2021-04-24 NOTE — Progress Notes (Signed)
Virtual Visit via Video Note  I connected with Rachel Carr on 04/24/21 at  9:30 AM EDT by a video enabled telemedicine application and verified that I am speaking with the correct person using two identifiers.  Location: Patient: At Home  Provider: [place]    Shared Decision Making Visit Lung Cancer Screening Program [phone])   Eligibility: Age 70 y.o. Pack Years Smoking History Calculation 47 pack year history  (# packs/per year x # years smoked) Recent History of coughing up blood  no Unexplained weight loss? no ( >Than 15 pounds within the last 6 months ) Prior History Lung / other cancer no (Diagnosis within the last 5 years already requiring surveillance chest CT Scans). >>>Small skin lesion removed by Derm 1 year ago, no formal chemo/ radiation  Smoking Status Former Smoker Former Smokers: Years since quit: 3 years  Quit Date: 2019  Visit Components: Discussion included one or more decision making aids. yes Discussion included risk/benefits of screening. yes Discussion included potential follow up diagnostic testing for abnormal scans. yes Discussion included meaning and risk of over diagnosis. yes Discussion included meaning and risk of False Positives. yes Discussion included meaning of total radiation exposure. yes  Counseling Included: Importance of adherence to annual lung cancer LDCT screening. yes Impact of comorbidities on ability to participate in the program. yes Ability and willingness to under diagnostic treatment. yes  Smoking Cessation Counseling: Former Smokers:  Discussed the importance of maintaining cigarette abstinence. yes Diagnosis Code: Personal History of Nicotine Dependence. [id number] Information about tobacco cessation classes and interventions provided to patient. Yes Patient provided with "ticket" for LDCT Scan. yes Written Order for Lung Cancer Screening with LDCT placed in Epic. Yes (CT  Chest Lung Cancer Screening Low Dose W/O CM) [id number] Z12.2-Screening of respiratory organs Z87.891-Personal history of nicotine dependence   Lauraine Rinne, NP

## 2021-04-24 NOTE — Patient Instructions (Signed)
Thank you for participating in the [facility]. It was our pleasure to meet you today. We will call you with the results of your scan within the next few days. Your scan will be assigned a Lung RADS category score by the physicians reading the scans.  This Lung RADS score determines follow up scanning.  See below for description of categories, and follow up screening recommendations. We will be in touch to schedule your follow up screening annually or based on recommendations of our providers. We will fax a copy of your scan results to your Primary Care Physician, or the physician who referred you to the program, to ensure they have the results. Please call the office if you have any questions or concerns regarding your scanning experience or results.  Our office number is [phone]. Please speak with Denise Phelps, RN. She is our Lung Cancer Screening RN. If she is unavailable when you call, please have the office staff send her a message. She will return your call at her earliest convenience. Remember, if your scan is normal, we will scan you annually as long as you continue to meet the criteria for the program. (Age 55-77, Current smoker or smoker who has quit within the last 15 years). If you are a smoker, remember, quitting is the single most powerful action that you can take to decrease your risk of lung cancer and other pulmonary, breathing related problems. We know quitting is hard, and we are here to help.  Please let us know if there is anything we can do to help you meet your goal of quitting. If you are a former smoker, congratulations. We are proud of you! Remain smoke free! Remember you can refer friends or family members through the number above.  We will screen them to make sure they meet criteria for the program. Thank you for helping us take better care of you by participating in Lung Screening.  Lung RADS Categories:  Lung RADS 1: no nodules  or definitely non-concerning nodules.  Recommendation is for a repeat annual scan in 12 months.  Lung RADS 2:  nodules that are non-concerning in appearance and behavior with a very low likelihood of becoming an active cancer. Recommendation is for a repeat annual scan in 12 months.  Lung RADS 3: nodules that are probably non-concerning , includes nodules with a low likelihood of becoming an active cancer.  Recommendation is for a 6-month repeat screening scan. Often noted after an upper respiratory illness. We will be in touch to make sure you have no questions, and to schedule your 6-month scan.  Lung RADS 4 A: nodules with concerning findings, recommendation is most often for a follow up scan in 3 months or additional testing based on our provider's assessment of the scan. We will be in touch to make sure you have no questions and to schedule the recommended 3 month follow up scan.  Lung RADS 4 B:  indicates findings that are concerning. We will be in touch with you to schedule additional diagnostic testing based on our provider's  assessment of the scan.   

## 2021-04-30 ENCOUNTER — Encounter: Payer: Self-pay | Admitting: *Deleted

## 2021-04-30 DIAGNOSIS — Z87891 Personal history of nicotine dependence: Secondary | ICD-10-CM

## 2021-06-04 ENCOUNTER — Other Ambulatory Visit: Payer: Self-pay | Admitting: Family Medicine

## 2021-06-16 ENCOUNTER — Ambulatory Visit: Payer: Medicare HMO | Admitting: Family Medicine

## 2021-06-25 DIAGNOSIS — M1990 Unspecified osteoarthritis, unspecified site: Secondary | ICD-10-CM | POA: Diagnosis not present

## 2021-06-25 DIAGNOSIS — I2510 Atherosclerotic heart disease of native coronary artery without angina pectoris: Secondary | ICD-10-CM | POA: Diagnosis not present

## 2021-06-25 DIAGNOSIS — G8929 Other chronic pain: Secondary | ICD-10-CM | POA: Diagnosis not present

## 2021-06-25 DIAGNOSIS — K219 Gastro-esophageal reflux disease without esophagitis: Secondary | ICD-10-CM | POA: Diagnosis not present

## 2021-06-25 DIAGNOSIS — I739 Peripheral vascular disease, unspecified: Secondary | ICD-10-CM | POA: Diagnosis not present

## 2021-06-25 DIAGNOSIS — E669 Obesity, unspecified: Secondary | ICD-10-CM | POA: Diagnosis not present

## 2021-06-25 DIAGNOSIS — J309 Allergic rhinitis, unspecified: Secondary | ICD-10-CM | POA: Diagnosis not present

## 2021-06-25 DIAGNOSIS — Z008 Encounter for other general examination: Secondary | ICD-10-CM | POA: Diagnosis not present

## 2021-06-25 DIAGNOSIS — E785 Hyperlipidemia, unspecified: Secondary | ICD-10-CM | POA: Diagnosis not present

## 2021-06-25 DIAGNOSIS — I10 Essential (primary) hypertension: Secondary | ICD-10-CM | POA: Diagnosis not present

## 2021-06-25 DIAGNOSIS — K5900 Constipation, unspecified: Secondary | ICD-10-CM | POA: Diagnosis not present

## 2021-06-30 NOTE — Progress Notes (Signed)
HPI: FU CAD. Previously followed at [facility]. Cath 7/17 showed 99 RCA and no other obstructive CAD; had PCI of RCA with DES. Last echo 7/17 showed normal LV function.  ABIs March 2021 normal with abnormal toe brachial index.  Nuclear study 11/21 showed EF 78 and no ischemia or infarction. Since last seen, the patient has dyspnea with more extreme activities but not with routine activities. It is relieved with rest. It is not associated with chest pain. There is no orthopnea, PND or pedal edema. There is no syncope or palpitations. There is no exertional chest pain.   Current Outpatient Medications  Medication Sig Dispense Refill   albuterol (VENTOLIN HFA) 108 (90 Base) MCG/ACT inhaler INHALE 2 PUFFS INTO THE LUNGS EVERY 4 (FOUR) HOURS AS NEEDED FOR WHEEZING 18 g prn   atorvastatin (LIPITOR) 80 MG tablet Take 1 tablet (80 mg total) by mouth at bedtime. 90 tablet 3   carvedilol (COREG) 6.25 MG tablet TAKE 1 TABLET BY MOUTH TWICE A DAY WITH FOOD 180 tablet 1   [facility] CHILDRENS ASPIRIN 81 MG chewable tablet Chew 1 tablet by mouth.  11   metFORMIN (GLUCOPHAGE-XR) 500 MG 24 hr tablet TAKE 1 TABLET BY MOUTH EVERY DAY WITH BREAKFAST 90 tablet 1   pantoprazole (PROTONIX) 40 MG tablet Take 1 tablet (40 mg total) by mouth daily. 90 tablet 3   No current facility-administered medications for this visit.     Past Medical History:  Diagnosis Date   Barrett's syndrome    CAD (coronary artery disease)    Esophageal stricture    Esophageal ulcer 09-2010   gastritis Dr. Glennon Hamilton   Gastric polyps    colonoscopy 2012(due again for repeat 2017)   Shingles    on the right eye    Past Surgical History:  Procedure Laterality Date   CARPAL TUNNEL RELEASE     CHOLECYSTECTOMY     COMPLETE MASTECTOMY W/ SENTINEL NODE BIOPSY     JOINT REPLACEMENT     NISSEN FUNDOPLICATION  9024   RADIOFREQUENCY ABLATION     RT knee surgery     TOTAL ABDOMINAL HYSTERECTOMY W/ BILATERAL SALPINGOOPHORECTOMY     endometriosis     Social History   Socioeconomic History   Marital status: Married    Spouse name: Percell Miller   Number of children: 1   Years of education: 12   Highest education level: High school graduate  Occupational History    Comment: Retired.  Tobacco Use   Smoking status: Former    Packs/day: 0.50    Years: 20.00    Pack years: 10.00    Types: Cigarettes    Quit date: 2017    Years since quitting: 5.9   Smokeless tobacco: Never  Vaping Use   Vaping Use: Former  Substance and Sexual Activity   Alcohol use: Yes    Comment: occasionally   Drug use: No   Sexual activity: Not on file  Other Topics Concern   Not on file  Social History Narrative   Lives with her husband. She has one child. She enjoys gardening and working outside.   Social Determinants of Health   Financial Resource Strain: Low Risk    Difficulty of Paying Living Expenses: Not hard at all  Food Insecurity: No Food Insecurity   Worried About Charity fundraiser in the Last Year: Never true   Ran Out of Food in the Last Year: Never true  Transportation Needs: No Transportation Needs  Lack of Transportation (Medical): No   Lack of Transportation (Non-Medical): No  Physical Activity: Inactive   Days of Exercise per Week: 0 days   Minutes of Exercise per Session: 0 min  Stress: No Stress Concern Present   Feeling of Stress : Not at all  Social Connections: Moderately Isolated   Frequency of Communication with Friends and Family: More than three times a week   Frequency of Social Gatherings with Friends and Family: Once a week   Attends Religious Services: Never   Marine scientist or Organizations: No   Attends Music therapist: Never   Marital Status: Married  Human resources officer Violence: Not At Risk   Fear of Current or Ex-Partner: No   Emotionally Abused: No   Physically Abused: No   Sexually Abused: No    Family History  Problem Relation Age of Onset   Heart disease Mother    Diabetes  Father    Cancer Father        prostrate   Cancer Sister        esophagus   Diabetes Sister    Cancer Brother        kidney    ROS: no fevers or chills, productive cough, hemoptysis, dysphasia, odynophagia, melena, hematochezia, dysuria, hematuria, rash, seizure activity, orthopnea, PND, pedal edema, claudication. Remaining systems are negative.  Physical Exam: Well-developed well-nourished in no acute distress.  Skin is warm and dry.  HEENT is normal.  Neck is supple.  Chest is clear to auscultation with normal expansion.  Cardiovascular exam is regular rate and rhythm.  Abdominal exam nontender or distended. No masses palpated. Extremities show no edema. neuro grossly intact  ECG- NSR, no ST changes; personally reviewed  A/P  1 CAD-Pt denies CP; continue ASA and statin.  2 Hypertension-BP controlled; continue present meds.  3 Hyperlipidemia-continue statin.  4 pedal edema-continue to elevate feet and low-sodium diet.  Kirk Ruths, MD

## 2021-07-01 ENCOUNTER — Encounter: Payer: Self-pay | Admitting: Family Medicine

## 2021-07-01 ENCOUNTER — Ambulatory Visit (INDEPENDENT_AMBULATORY_CARE_PROVIDER_SITE_OTHER): Payer: Medicare HMO | Admitting: Family Medicine

## 2021-07-01 ENCOUNTER — Other Ambulatory Visit: Payer: Self-pay

## 2021-07-01 VITALS — BP 128/72 | HR 65 | Ht 61.0 in | Wt 176.0 lb

## 2021-07-01 DIAGNOSIS — J439 Emphysema, unspecified: Secondary | ICD-10-CM | POA: Insufficient documentation

## 2021-07-01 DIAGNOSIS — I2510 Atherosclerotic heart disease of native coronary artery without angina pectoris: Secondary | ICD-10-CM | POA: Diagnosis not present

## 2021-07-01 DIAGNOSIS — R7301 Impaired fasting glucose: Secondary | ICD-10-CM | POA: Diagnosis not present

## 2021-07-01 DIAGNOSIS — K2270 Barrett's esophagus without dysplasia: Secondary | ICD-10-CM

## 2021-07-01 DIAGNOSIS — Z9861 Coronary angioplasty status: Secondary | ICD-10-CM | POA: Diagnosis not present

## 2021-07-01 DIAGNOSIS — Z23 Encounter for immunization: Secondary | ICD-10-CM | POA: Diagnosis not present

## 2021-07-01 DIAGNOSIS — K5900 Constipation, unspecified: Secondary | ICD-10-CM

## 2021-07-01 LAB — POCT GLYCOSYLATED HEMOGLOBIN (HGB A1C): HbA1c, POC (prediabetic range): 5.6 % — AB (ref 5.7–6.4)

## 2021-07-01 MED ORDER — ATORVASTATIN CALCIUM 80 MG PO TABS: 80.0000 mg | ORAL_TABLET | Freq: Every day | ORAL | 3 refills | Status: DC

## 2021-07-01 MED ORDER — METFORMIN HCL ER 500 MG PO TB24: ORAL_TABLET | ORAL | 1 refills | Status: DC

## 2021-07-01 NOTE — Assessment & Plan Note (Signed)
A1c looks phenomenal today.  Great work!  She is down 4 pounds and has been exercising regularly.  My goal is to hopefully get her off of metformin if her A1c is still down when I see her back next time our plan will be to discontinue it.

## 2021-07-01 NOTE — Progress Notes (Signed)
Established Patient Office Visit  Subjective:  Patient ID: Rachel Carr, female    DOB: June 01, 1951  Age: 70 y.o. MRN: [id number]  CC: No chief complaint on file.   HPI Taj Arteaga presents for   Impaired fasting glucose-no increased thirst or urination. No symptoms consistent with hypoglycemia.  F/U CAD - No recent CP or shortness of breath.  In fact she has been trying to walk on the treadmill regularly.  But it has been bothering her left knee.  She just had a knee replacement in February.  She has an appointment with Dr. Jacques Navy her orthopedist in January and he did encourage her to just decrease how much she is walking on the treadmill so she has made some modifications.  She is also struggling some with constipation she says she has been using prune lax over-the-counter every other day but it just does not seem to be working like it used to.  Past Medical History:  Diagnosis Date   Barrett's syndrome    CAD (coronary artery disease)    Esophageal stricture    Esophageal ulcer 09-2010   gastritis Dr. Glennon Hamilton   Gastric polyps    colonoscopy 2012(due again for repeat 2017)   Shingles    on the right eye    Past Surgical History:  Procedure Laterality Date   CARPAL TUNNEL RELEASE     CHOLECYSTECTOMY     COMPLETE MASTECTOMY W/ SENTINEL NODE BIOPSY     JOINT REPLACEMENT     NISSEN FUNDOPLICATION  1700   RADIOFREQUENCY ABLATION     RT knee surgery     TOTAL ABDOMINAL HYSTERECTOMY W/ BILATERAL SALPINGOOPHORECTOMY     endometriosis    Family History  Problem Relation Age of Onset   Heart disease Mother    Diabetes Father    Cancer Father        prostrate   Cancer Sister        esophagus   Diabetes Sister    Cancer Brother        kidney    Social History   Socioeconomic History   Marital status: Married    Spouse name: Percell Miller   Number of children: 1   Years of education: 12   Highest education level: High school graduate  Occupational History     Comment: Retired.  Tobacco Use   Smoking status: Former    Packs/day: 0.50    Years: 20.00    Pack years: 10.00    Types: Cigarettes    Quit date: 2017    Years since quitting: 5.8   Smokeless tobacco: Never  Vaping Use   Vaping Use: Former  Substance and Sexual Activity   Alcohol use: Yes    Comment: occasionally   Drug use: No   Sexual activity: Not on file  Other Topics Concern   Not on file  Social History Narrative   Lives with her husband. She has one child. She enjoys gardening and working outside.   Social Determinants of Health   Financial Resource Strain: Low Risk    Difficulty of Paying Living Expenses: Not hard at all  Food Insecurity: No Food Insecurity   Worried About Charity fundraiser in the Last Year: Never true   [place] in the Last Year: Never true  Transportation Needs: No Transportation Needs   Lack of Transportation (Medical): No   Lack of Transportation (Non-Medical): No  Physical Activity: Inactive   Days of Exercise per  Week: 0 days   Minutes of Exercise per Session: 0 min  Stress: No Stress Concern Present   Feeling of Stress : Not at all  Social Connections: Moderately Isolated   Frequency of Communication with Friends and Family: More than three times a week   Frequency of Social Gatherings with Friends and Family: Once a week   Attends Religious Services: Never   Marine scientist or Organizations: No   Attends Music therapist: Never   Marital Status: Married  Human resources officer Violence: Not At Risk   Fear of Current or Ex-Partner: No   Emotionally Abused: No   Physically Abused: No   Sexually Abused: No    Outpatient Medications Prior to Visit  Medication Sig Dispense Refill   albuterol (VENTOLIN HFA) 108 (90 Base) MCG/ACT inhaler INHALE 2 PUFFS INTO THE LUNGS EVERY 4 (FOUR) HOURS AS NEEDED FOR WHEEZING 18 g prn   carvedilol (COREG) 6.25 MG tablet TAKE 1 TABLET BY MOUTH TWICE A DAY WITH FOOD 180 tablet 1    [facility] CHILDRENS ASPIRIN 81 MG chewable tablet Chew 1 tablet by mouth.  11   pantoprazole (PROTONIX) 40 MG tablet Take 1 tablet (40 mg total) by mouth daily. 90 tablet 3   atorvastatin (LIPITOR) 80 MG tablet TAKE ONE TABLET (80 MG TOTAL) BY MOUTH AT BEDTIME. 90 tablet 1   metFORMIN (GLUCOPHAGE-XR) 500 MG 24 hr tablet TAKE 1 TABLET BY MOUTH EVERY DAY WITH BREAKFAST 90 tablet 1   amoxicillin (AMOXIL) 875 MG tablet Take 1 tablet (875 mg total) by mouth 2 (two) times daily. 20 tablet 0   GARLIC PO Take 3,419 mg by mouth daily. (Patient not taking: Reported on 02/12/2021)     meclizine (ANTIVERT) 25 MG tablet Take 1 tablet (25 mg total) by mouth 3 (three) times daily as needed for dizziness. 12 tablet 0   predniSONE (DELTASONE) 20 MG tablet Take one tab by mouth twice daily for 4 days, then one daily for 3 days. Take with food. 11 tablet 0   No facility-administered medications prior to visit.    Allergies  Allergen Reactions   Tramadol Nausea Only    ROS Review of Systems    Objective:    Physical Exam Constitutional:      Appearance: Normal appearance. She is well-developed.  HENT:     Head: Normocephalic and atraumatic.  Cardiovascular:     Rate and Rhythm: Normal rate and regular rhythm.     Heart sounds: Normal heart sounds.  Pulmonary:     Effort: Pulmonary effort is normal.     Breath sounds: Normal breath sounds.  Skin:    General: Skin is warm and dry.  Neurological:     Mental Status: She is alert and oriented to person, place, and time.  Psychiatric:        Behavior: Behavior normal.    BP 128/72   Pulse 65   Ht 5\' 1"  (1.549 m)   Wt 176 lb (79.8 kg)   BMI 33.25 kg/m  Wt Readings from Last 3 Encounters:  07/01/21 176 lb (79.8 kg)  12/12/20 180 lb (81.6 kg)  06/17/20 188 lb (85.3 kg)     Health Maintenance Due  Topic Date Due   COVID-19 Vaccine (1) Never done   Zoster Vaccines- Shingrix (1 of 2) Never done    There are no preventive care reminders to  display for this patient.  Lab Results  Component Value Date   TSH 0.79 10/25/2017  Lab Results  Component Value Date   WBC 6.9 09/09/2020   HGB 13.7 09/09/2020   HCT 40.5 09/09/2020   MCV 91.4 09/09/2020   PLT 266 09/09/2020   Lab Results  Component Value Date   NA 139 12/12/2020   K 4.7 12/12/2020   CO2 28 12/12/2020   GLUCOSE 93 12/12/2020   BUN 11 12/12/2020   CREATININE 0.88 12/12/2020   BILITOT 0.8 12/12/2020   ALKPHOS 94 09/29/2016   AST 12 12/12/2020   ALT 11 12/12/2020   PROT 6.4 12/12/2020   ALBUMIN 4.0 09/29/2016   CALCIUM 9.5 12/12/2020   Lab Results  Component Value Date   CHOL 149 12/12/2020   Lab Results  Component Value Date   HDL 65 12/12/2020   Lab Results  Component Value Date   LDLCALC 64 12/12/2020   Lab Results  Component Value Date   TRIG 112 12/12/2020   Lab Results  Component Value Date   CHOLHDL 2.3 12/12/2020   Lab Results  Component Value Date   HGBA1C 5.6 (A) 07/01/2021      Assessment & Plan:   Problem List Items Addressed This Visit       Cardiovascular and Mediastinum   CAD S/P percutaneous coronary angioplasty    Stable on current regimen.  Due for BMP today.  No recent chest pain or shortness of breath with exercise.      Relevant Medications   atorvastatin (LIPITOR) 80 MG tablet   Other Relevant Orders   BASIC METABOLIC PANEL WITH GFR     Respiratory   Pulmonary emphysema (HCC)    Stable on current regimen.  Uses albuterol as needed.  Recent CT lung cancer screening up-to-date.        Digestive   Barrett's esophagus (Chronic)    Continue daily pantoprazole.        Endocrine   IFG (impaired fasting glucose) - Primary    A1c looks phenomenal today.  Great work!  She is down 4 pounds and has been exercising regularly.  My goal is to hopefully get her off of metformin if her A1c is still down when I see her back next time our plan will be to discontinue it.      Relevant Medications   metFORMIN  (GLUCOPHAGE-XR) 500 MG 24 hr tablet   Other Relevant Orders   POCT HgB A1C (Completed)   BASIC METABOLIC PANEL WITH GFR     Other   Constipation    We discussed starting MiraLAX nightly it is safe to use for long periods of time encouraged her to get on it for at least 2 to 3 weeks to see if she notices improvement in softness and frequency of stool.  If it is too strong okay to cut down to half a capful.      Other Visit Diagnoses     Need for influenza vaccination       Relevant Orders   Flu Vaccine QUAD High Dose(Fluad) (Completed)       Meds ordered this encounter  Medications   metFORMIN (GLUCOPHAGE-XR) 500 MG 24 hr tablet    Sig: TAKE 1 TABLET BY MOUTH EVERY DAY WITH BREAKFAST    Dispense:  90 tablet    Refill:  1   atorvastatin (LIPITOR) 80 MG tablet    Sig: Take 1 tablet (80 mg total) by mouth at bedtime.    Dispense:  90 tablet    Refill:  3     Follow-up: Return in  about 6 months (around 12/29/2021) for Prediabetes .    Beatrice Lecher, MD

## 2021-07-01 NOTE — Assessment & Plan Note (Signed)
Stable on current regimen.  Uses albuterol as needed.  Recent CT lung cancer screening up-to-date.

## 2021-07-01 NOTE — Assessment & Plan Note (Signed)
We discussed starting MiraLAX nightly it is safe to use for long periods of time encouraged her to get on it for at least 2 to 3 weeks to see if she notices improvement in softness and frequency of stool.  If it is too strong okay to cut down to half a capful.

## 2021-07-01 NOTE — Assessment & Plan Note (Signed)
Stable on current regimen.  Due for BMP today.  No recent chest pain or shortness of breath with exercise.

## 2021-07-01 NOTE — Assessment & Plan Note (Signed)
Continue daily pantoprazole.  °

## 2021-07-02 LAB — BASIC METABOLIC PANEL WITH GFR
BUN: 13 mg/dL (ref 7–25)
CO2: 29 mmol/L (ref 20–32)
Calcium: 9.3 mg/dL (ref 8.6–10.4)
Chloride: 104 mmol/L (ref 98–110)
Creat: 0.79 mg/dL (ref 0.60–1.00)
Glucose, Bld: 87 mg/dL (ref 65–99)
Potassium: 4.9 mmol/L (ref 3.5–5.3)
Sodium: 140 mmol/L (ref 135–146)
eGFR: 80 mL/min/{1.73_m2} (ref >=60)

## 2021-07-02 NOTE — Progress Notes (Signed)
Your lab work is within acceptable range and there are no concerning findings.   ?

## 2021-07-09 ENCOUNTER — Encounter: Payer: Self-pay | Admitting: Cardiology

## 2021-07-09 ENCOUNTER — Other Ambulatory Visit: Payer: Self-pay

## 2021-07-09 ENCOUNTER — Ambulatory Visit: Payer: Medicare HMO | Admitting: Cardiology

## 2021-07-09 VITALS — BP 124/69 | HR 69 | Ht 61.0 in | Wt 179.0 lb

## 2021-07-09 DIAGNOSIS — I10 Essential (primary) hypertension: Secondary | ICD-10-CM | POA: Diagnosis not present

## 2021-07-09 DIAGNOSIS — I2510 Atherosclerotic heart disease of native coronary artery without angina pectoris: Secondary | ICD-10-CM

## 2021-07-09 DIAGNOSIS — E7800 Pure hypercholesterolemia, unspecified: Secondary | ICD-10-CM | POA: Diagnosis not present

## 2021-07-09 NOTE — Patient Instructions (Signed)

## 2021-08-26 DIAGNOSIS — M1711 Unilateral primary osteoarthritis, right knee: Secondary | ICD-10-CM | POA: Diagnosis not present

## 2021-08-26 DIAGNOSIS — M1712 Unilateral primary osteoarthritis, left knee: Secondary | ICD-10-CM | POA: Diagnosis not present

## 2021-11-07 DIAGNOSIS — H2513 Age-related nuclear cataract, bilateral: Secondary | ICD-10-CM | POA: Diagnosis not present

## 2021-11-07 DIAGNOSIS — H353131 Nonexudative age-related macular degeneration, bilateral, early dry stage: Secondary | ICD-10-CM | POA: Diagnosis not present

## 2021-11-07 DIAGNOSIS — H43813 Vitreous degeneration, bilateral: Secondary | ICD-10-CM | POA: Diagnosis not present

## 2021-11-07 DIAGNOSIS — Z0100 Encounter for examination of eyes and vision without abnormal findings: Secondary | ICD-10-CM | POA: Diagnosis not present

## 2021-11-27 ENCOUNTER — Other Ambulatory Visit: Payer: Self-pay | Admitting: Family Medicine

## 2021-11-30 ENCOUNTER — Emergency Department
Admission: EM | Admit: 2021-11-30 | Discharge: 2021-11-30 | Disposition: A | Discharge status: Home / Self Care | Payer: Medicare HMO | Source: Home / Self Care

## 2021-11-30 ENCOUNTER — Other Ambulatory Visit: Payer: Self-pay

## 2021-11-30 DIAGNOSIS — H8110 Benign paroxysmal vertigo, unspecified ear: Secondary | ICD-10-CM

## 2021-11-30 DIAGNOSIS — H6122 Impacted cerumen, left ear: Secondary | ICD-10-CM | POA: Diagnosis not present

## 2021-11-30 DIAGNOSIS — J0100 Acute maxillary sinusitis, unspecified: Secondary | ICD-10-CM | POA: Diagnosis not present

## 2021-11-30 MED ORDER — MECLIZINE HCL 25 MG PO TABS: 25.0000 mg | ORAL_TABLET | Freq: Three times a day (TID) | ORAL | 0 refills | Status: DC | PRN

## 2021-11-30 MED ORDER — PREDNISONE 20 MG PO TABS: ORAL_TABLET | ORAL | 0 refills | Status: DC

## 2021-11-30 MED ORDER — AMOXICILLIN-POT CLAVULANATE 875-125 MG PO TABS: 1.0000 | ORAL_TABLET | Freq: Two times a day (BID) | ORAL | 0 refills | Status: AC

## 2021-11-30 NOTE — Discharge Instructions (Addendum)
Instructed patient to take medication as directed with food to completion.  Advised patient to take prednisone with first dose of Augmentin for the next 5 of 10 days.  Advised patient may take Meclizine 1-3 times daily, as needed for dizziness.  Encouraged patient to increase daily water intake while taking these medications.  Advised patient if symptoms worsen and/or unresolved please follow-up with PCP or here for further evaluation. ?

## 2021-11-30 NOTE — ED Provider Notes (Signed)
?[facility] ? ? ? ?CSN: [id number] ?Arrival date & time: 11/30/21  1018 ? ? ?  ? ?History   ?Chief Complaint ?Chief Complaint  ?Patient presents with  ? Otalgia  ? ? ?HPI ?Rachel Carr is a 71 y.o. female.  ? ?HPI 71 year old female presents with bilateral ear pain, facial pressure, and dizziness for 1 day.  PMH significant for CAD and PVD.  Patient reports feeling similar to episode she had when presenting here in June of last year. ? ?Past Medical History:  ?Diagnosis Date  ? Barrett's syndrome   ? CAD (coronary artery disease)   ? Esophageal stricture   ? Esophageal ulcer 09-2010  ? gastritis Dr. Glennon Hamilton  ? Gastric polyps   ? colonoscopy 2012(due again for repeat 2017)  ? Shingles   ? on the right eye  ? ? ?Patient Active Problem List  ? Diagnosis Date Noted  ? Constipation 07/01/2021  ? Pulmonary emphysema ([place]) 07/01/2021  ? Bilateral primary osteoarthritis of knee 11/26/2016  ? CAD S/P percutaneous coronary angioplasty 09/29/2016  ? Unstable angina ([place]) 02/27/2016  ? IFG (impaired fasting glucose) 11/20/2015  ? 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 Regurgitation 12/10/2011  ? Obese 10/14/2011  ? Barrett's esophagus 01/13/2011  ? Tobacco use disorder 01/02/2011  ? COLONIC POLYPS 10/10/2010  ? ULCER OF ESOPHAGUS WITHOUT BLEEDING 10/10/2010  ? GERD 08/15/2010  ? 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 ? ?Past Surgical History:  ?Procedure Laterality Date  ? CARPAL TUNNEL RELEASE    ? CHOLECYSTECTOMY    ? COMPLETE MASTECTOMY W/ SENTINEL NODE BIOPSY    ? JOINT REPLACEMENT    ? NISSEN FUNDOPLICATION  4782  ? RADIOFREQUENCY ABLATION    ? RT knee surgery    ? TOTAL ABDOMINAL HYSTERECTOMY W/ BILATERAL SALPINGOOPHORECTOMY    ? endometriosis  ? ? ?OB History   ?No obstetric history on file. ?  ? ? ? ?Home Medications   ? ?Prior to Admission medications   ?Medication Sig Start  Date End Date Taking? Authorizing Provider  ?amoxicillin-clavulanate (AUGMENTIN) 875-125 MG tablet Take 1 tablet by mouth 2 (two) times daily for 10 days. 11/30/21 12/10/21 Yes Eliezer Lofts, FNP  ?meclizine (ANTIVERT) 25 MG tablet Take 1 tablet (25 mg total) by mouth 3 (three) times daily as needed for dizziness. 11/30/21  Yes Eliezer Lofts, FNP  ?predniSONE (DELTASONE) 20 MG tablet Take 3 tabs PO daily x 5 days. 11/30/21  Yes Eliezer Lofts, FNP  ?albuterol (VENTOLIN HFA) 108 (90 Base) MCG/ACT inhaler INHALE 2 PUFFS INTO THE LUNGS EVERY 4 (FOUR) HOURS AS NEEDED FOR WHEEZING 02/14/19   Hali Marry, MD  ?atorvastatin (LIPITOR) 80 MG tablet Take 1 tablet (80 mg total) by mouth at bedtime. 07/01/21   Hali Marry, MD  ?carvedilol (COREG) 6.25 MG tablet TAKE 1 TABLET BY MOUTH TWICE A DAY WITH FOOD 11/27/21   Hali Marry, MD  ?[facility] ASPIRIN 81 MG chewable tablet Chew 1 tablet by mouth. 02/28/16   [provider]  ?metFORMIN (GLUCOPHAGE-XR) 500 MG 24 hr tablet TAKE 1 TABLET BY MOUTH EVERY DAY WITH BREAKFAST 07/01/21   Hali Marry, MD  ?pantoprazole (PROTONIX) 40 MG tablet Take 1 tablet (40 mg total) by mouth daily. 12/12/20   Hali Marry, MD  ? ? ?Family History ?Family History  ?Problem Relation Age of Onset  ? Heart disease Mother   ?  Diabetes Father   ? Cancer Father   ?     prostrate  ? Cancer Sister   ?     esophagus  ? Diabetes Sister   ? Cancer Brother   ?     kidney  ? ? ?Social History ?Social History  ? ?Tobacco Use  ? Smoking status: Former  ?  Packs/day: 0.50  ?  Years: 20.00  ?  Pack years: 10.00  ?  Types: Cigarettes  ?  Quit date: 2017  ?  Years since quitting: 6.3  ? Smokeless tobacco: Never  ?Vaping Use  ? Vaping Use: Former  ?Substance Use Topics  ? Alcohol use: Yes  ?  Comment: occasionally  ? Drug use: No  ? ? ? ?Allergies   ?Tramadol ? ? ?Review of Systems ?Review of Systems  ?HENT:  Positive for ear pain.   ?All other systems reviewed and are  negative. ? ? ?Physical Exam ?Triage Vital Signs ?ED Triage Vitals  ?Enc Vitals Group  ?   BP   ?   Pulse   ?   Resp   ?   Temp   ?   Temp src   ?   SpO2   ?   Weight   ?   Height   ?   Head Circumference   ?   Peak Flow   ?   Pain Score   ?   Pain Loc   ?   Pain Edu?   ?   Excl. in [place]?   ? ?No data found. ? ?Updated Vital Signs ?BP 127/84 (BP Location: Left Arm)   Pulse 67   Temp 97.6 ?F (36.4 ?C) (Oral)   Resp 14   SpO2 98%  ? ?   ? ?Physical Exam ?Vitals and nursing note reviewed.  ?Constitutional:   ?   Appearance: Normal appearance. She is obese.  ?HENT:  ?   Head: Normocephalic and atraumatic.  ?   Right Ear: Tympanic membrane, ear canal and external ear normal.  ?   Left Ear: External ear normal.  ?   Ears:  ?   Comments: Moderate to significant eustachian tube dysfunction noted bilaterally; unable to visualize left TM due to excessive cerumen of the left auditory canal; post left ear lavage: Left EAC-clear; left TM-clear, retracted, with dull light reflex ?   Mouth/Throat:  ?   Mouth: Mucous membranes are moist.  ?   Pharynx: Oropharynx is clear.  ?Eyes:  ?   Extraocular Movements: Extraocular movements intact.  ?   Conjunctiva/sclera: Conjunctivae normal.  ?   Pupils: Pupils are equal, round, and reactive to light.  ?Cardiovascular:  ?   Rate and Rhythm: Normal rate.  ?   Pulses: Normal pulses.  ?   Heart sounds: Normal heart sounds.  ?Pulmonary:  ?   Effort: Pulmonary effort is normal.  ?   Breath sounds: Normal breath sounds. No wheezing, rhonchi or rales.  ?Musculoskeletal:  ?   Cervical back: Normal range of motion and neck supple.  ?Skin: ?   General: Skin is warm and dry.  ?Neurological:  ?   General: No focal deficit present.  ?   Mental Status: She is alert and oriented to person, place, and time.  ? ? ? ?UC Treatments / Results  ?Labs ?(all labs ordered are listed, but only abnormal results are displayed) ?Labs Reviewed - No data to display ? ?EKG ? ? ?Radiology ?No results  found. ? ?Procedures ?  Procedures (including critical care time) ? ?Medications Ordered in UC ?Medications - No data to display ? ?Initial Impression / Assessment and Plan / UC Course  ?I have reviewed the triage vital signs and the nursing notes. ? ?Pertinent labs & imaging results that were available during my care of the patient were reviewed by me and considered in my medical decision making (see chart for details). ? ?  ? ?MDM: 1.  Acute maxillary sinusitis-Rx'd Augmentin and prednisone; 2. Benign paroxysmal positional vertigo-Rx'd Meclizine; 3.  Excessive cerumen in ear canal, left-resolved with ear lavage. Instructed patient to take medication as directed with food to completion.  Advised patient to take prednisone with first dose of Augmentin for the next 5 of 10 days.  Advised patient may take Meclizine 1-3 times daily, as needed for dizziness.  Encouraged patient to increase daily water intake while taking these medications.  Advised patient if symptoms worsen and/or unresolved please follow-up with PCP or here for further evaluation.  Patient discharged home, hemodynamically stable. ?Final Clinical Impressions(s) / UC Diagnoses  ? ?Final diagnoses:  ?Acute maxillary sinusitis, recurrence not specified  ?Benign paroxysmal positional vertigo, unspecified laterality  ?Excessive cerumen in ear canal, left  ? ? ? ?Discharge Instructions   ? ?  ?Instructed patient to take medication as directed with food to completion.  Advised patient to take prednisone with first dose of Augmentin for the next 5 of 10 days.  Advised patient may take Meclizine 1-3 times daily, as needed for dizziness.  Encouraged patient to increase daily water intake while taking these medications.  Advised patient if symptoms worsen and/or unresolved please follow-up with PCP or here for further evaluation. ? ? ? ? ?ED Prescriptions   ? ? Medication Sig Dispense Auth. Provider  ? amoxicillin-clavulanate (AUGMENTIN) 875-125 MG tablet Take 1  tablet by mouth 2 (two) times daily for 10 days. 20 tablet Eliezer Lofts, FNP  ? predniSONE (DELTASONE) 20 MG tablet Take 3 tabs PO daily x 5 days. 15 tablet Eliezer Lofts, FNP  ? meclizine (ANTIVERT) 25 MG tablet Take 1 tabl

## 2021-11-30 NOTE — ED Triage Notes (Signed)
Pt presents with bilateral ear pain, facial pressure, and dizziness that began yesterday. ?

## 2021-12-12 ENCOUNTER — Other Ambulatory Visit: Payer: Self-pay | Admitting: Family Medicine

## 2021-12-29 ENCOUNTER — Ambulatory Visit (INDEPENDENT_AMBULATORY_CARE_PROVIDER_SITE_OTHER): Payer: Medicare HMO | Admitting: Family Medicine

## 2021-12-29 ENCOUNTER — Encounter: Payer: Self-pay | Admitting: Family Medicine

## 2021-12-29 ENCOUNTER — Ambulatory Visit (INDEPENDENT_AMBULATORY_CARE_PROVIDER_SITE_OTHER): Payer: Medicare HMO

## 2021-12-29 VITALS — BP 119/67 | HR 70 | Ht 61.0 in | Wt 171.0 lb

## 2021-12-29 DIAGNOSIS — I2510 Atherosclerotic heart disease of native coronary artery without angina pectoris: Secondary | ICD-10-CM | POA: Diagnosis not present

## 2021-12-29 DIAGNOSIS — M25541 Pain in joints of right hand: Secondary | ICD-10-CM | POA: Diagnosis not present

## 2021-12-29 DIAGNOSIS — Z9861 Coronary angioplasty status: Secondary | ICD-10-CM | POA: Diagnosis not present

## 2021-12-29 DIAGNOSIS — M25542 Pain in joints of left hand: Secondary | ICD-10-CM

## 2021-12-29 DIAGNOSIS — M7989 Other specified soft tissue disorders: Secondary | ICD-10-CM | POA: Diagnosis not present

## 2021-12-29 DIAGNOSIS — R7301 Impaired fasting glucose: Secondary | ICD-10-CM | POA: Diagnosis not present

## 2021-12-29 LAB — POCT GLYCOSYLATED HEMOGLOBIN (HGB A1C): Hemoglobin A1C: 5.4 % (ref 4.0–5.6)

## 2021-12-29 NOTE — Progress Notes (Signed)
HI Rachel Carr, your have mostly wear and tear arthritis in your joint.  No sign of rheumatoid.  You also have a lot of athritis in your thumb as well.  Try the Voltaren gel and see if you feel it helps.

## 2021-12-29 NOTE — Assessment & Plan Note (Addendum)
Takes a daily ASA and lIpitor.

## 2021-12-29 NOTE — Patient Instructions (Signed)
Okay to stop your metformin. Recommend a trial of Voltaren gel for your hands.  Just put it on the affected joints up to 3 times a day.

## 2021-12-29 NOTE — Assessment & Plan Note (Signed)
Well controlled. Ok to d/c metformin Follow up in  6 o

## 2021-12-29 NOTE — Progress Notes (Signed)
Please see the note for your left hand.  You also have a lot of arthritis in your joints on your right hand.

## 2021-12-29 NOTE — Progress Notes (Signed)
Established Patient Office Visit  Subjective   Patient ID: Rachel Carr, female    DOB: Jul 06, 1951  Age: 71 y.o. MRN: [id number]  Chief Complaint  Patient presents with   ifg   Joint Swelling    Pt reports having joint pain/swelling     HPI  Impaired fasting glucose-no increased thirst or urination. No symptoms consistent with hypoglycemia.  She has been walking on the treadmill and gardening regularly.  Swelling in certain joints on her fingers.  Mostly affects her index and middle fingers on both hands over the PIP joint.  She says sometimes they get so swollen they are stiff and she can barely flex them.  Working out in her garden really bothers her but it does not stop her.  She mostly uses Tylenol for pain she has not tried an NSAID.    ROS    Objective:     BP 119/67   Pulse 70   Ht '5\' 1"'$  (1.549 m)   Wt 171 lb (77.6 kg)   SpO2 99%   BMI 32.31 kg/m    Physical Exam Vitals and nursing note reviewed.  Constitutional:      Appearance: She is well-developed.  HENT:     Head: Normocephalic and atraumatic.  Cardiovascular:     Rate and Rhythm: Normal rate and regular rhythm.     Heart sounds: Normal heart sounds.  Pulmonary:     Effort: Pulmonary effort is normal.     Breath sounds: Normal breath sounds.  Musculoskeletal:     Comments: significant swelling over the PIP joints on her DEXA middle fingers on both hands.  Skin:    General: Skin is warm and dry.  Neurological:     Mental Status: She is alert and oriented to person, place, and time.  Psychiatric:        Behavior: Behavior normal.    Results for orders placed or performed in visit on 12/29/21  POCT glycosylated hemoglobin (Hb A1C)  Result Value Ref Range   Hemoglobin A1C 5.4 4.0 - 5.6 %   HbA1c POC (<> result, manual entry)     HbA1c, POC (prediabetic range)     HbA1c, POC (controlled diabetic range)        The 10-year ASCVD risk score (Arnett DK, et al., 2019) is: 11.1%     Assessment & Plan:   Problem List Items Addressed This Visit       Cardiovascular and Mediastinum   CAD S/P percutaneous coronary angioplasty    Takes a daily ASA and lIpitor.         Relevant Orders   POCT glycosylated hemoglobin (Hb A1C) (Completed)   COMPLETE METABOLIC PANEL WITH GFR   Lipid Panel w/reflex Direct LDL     Endocrine   IFG (impaired fasting glucose) - Primary    Well controlled. Ok to d/c metformin Follow up in  6 o        Relevant Orders   POCT glycosylated hemoglobin (Hb A1C) (Completed)   COMPLETE METABOLIC PANEL WITH GFR   Lipid Panel w/reflex Direct LDL   Other Visit Diagnoses     Arthralgia of both hands       Relevant Orders   DG Hand 2 View Right (Completed)   DG Hand 2 View Left (Completed)      Arthralgia hands-most consistent with synovitis secondary to osteoarthritis.  We discussed maybe a topical NSAID such as Voltaren gel.  Or even an occasional oral NSAID just not  frequent use.  She was open to the idea of trying the topical first along with her Tylenol.  We will get x-rays today.  She does have a prior history of Niesen fundoplication so I definitely want to limit oral NSAIDs to very infrequent use.  Return in about 6 months (around 07/01/2022) for Hypertension, Pre-diabetes.    Beatrice Lecher, MD

## 2021-12-30 LAB — LIPID PANEL W/REFLEX DIRECT LDL
Cholesterol: 145 mg/dL (ref <200)
HDL: 57 mg/dL (ref >=50)
LDL Cholesterol (Calc): 69 mg/dL (calc)
Non-HDL Cholesterol (Calc): 88 mg/dL (calc) (ref <130)
Total CHOL/HDL Ratio: 2.5 (calc) (ref <5.0)
Triglycerides: 100 mg/dL (ref <150)

## 2021-12-30 LAB — COMPLETE METABOLIC PANEL WITH GFR
AG Ratio: 1.9 (calc) (ref 1.0–2.5)
ALT: 10 U/L (ref 6–29)
AST: 14 U/L (ref 10–35)
Albumin: 3.9 g/dL (ref 3.6–5.1)
Alkaline phosphatase (APISO): 91 U/L (ref 37–153)
BUN: 10 mg/dL (ref 7–25)
CO2: 28 mmol/L (ref 20–32)
Calcium: 9 mg/dL (ref 8.6–10.4)
Chloride: 106 mmol/L (ref 98–110)
Creat: 0.63 mg/dL (ref 0.60–1.00)
Globulin: 2.1 g/dL (calc) (ref 1.9–3.7)
Glucose, Bld: 93 mg/dL (ref 65–139)
Potassium: 4.9 mmol/L (ref 3.5–5.3)
Sodium: 140 mmol/L (ref 135–146)
Total Bilirubin: 0.7 mg/dL (ref 0.2–1.2)
Total Protein: 6 g/dL — ABNORMAL LOW (ref 6.1–8.1)
eGFR: 95 mL/min/{1.73_m2} (ref >=60)

## 2021-12-30 NOTE — Progress Notes (Signed)
Hi Rachel Carr, overall your metabolic panel looks good.  Protein level was just borderline low just make sure you are getting adequate protein intake in your diet.  Cholesterol levels look great!

## 2022-01-15 ENCOUNTER — Other Ambulatory Visit: Payer: Self-pay | Admitting: *Deleted

## 2022-01-15 DIAGNOSIS — I2510 Atherosclerotic heart disease of native coronary artery without angina pectoris: Secondary | ICD-10-CM

## 2022-01-15 MED ORDER — ATORVASTATIN CALCIUM 80 MG PO TABS: 80.0000 mg | ORAL_TABLET | Freq: Every day | ORAL | 3 refills | Status: DC

## 2022-01-23 ENCOUNTER — Encounter: Payer: Self-pay | Admitting: Family Medicine

## 2022-01-23 DIAGNOSIS — D6949 Other primary thrombocytopenia: Secondary | ICD-10-CM | POA: Diagnosis not present

## 2022-01-23 DIAGNOSIS — E669 Obesity, unspecified: Secondary | ICD-10-CM | POA: Diagnosis not present

## 2022-01-23 DIAGNOSIS — E785 Hyperlipidemia, unspecified: Secondary | ICD-10-CM | POA: Diagnosis not present

## 2022-01-23 DIAGNOSIS — K08109 Complete loss of teeth, unspecified cause, unspecified class: Secondary | ICD-10-CM | POA: Diagnosis not present

## 2022-01-23 DIAGNOSIS — Z008 Encounter for other general examination: Secondary | ICD-10-CM | POA: Diagnosis not present

## 2022-01-23 DIAGNOSIS — J439 Emphysema, unspecified: Secondary | ICD-10-CM | POA: Diagnosis not present

## 2022-01-23 DIAGNOSIS — K2270 Barrett's esophagus without dysplasia: Secondary | ICD-10-CM | POA: Diagnosis not present

## 2022-01-23 DIAGNOSIS — M1990 Unspecified osteoarthritis, unspecified site: Secondary | ICD-10-CM | POA: Diagnosis not present

## 2022-01-23 DIAGNOSIS — I739 Peripheral vascular disease, unspecified: Secondary | ICD-10-CM | POA: Diagnosis not present

## 2022-01-23 DIAGNOSIS — I10 Essential (primary) hypertension: Secondary | ICD-10-CM | POA: Diagnosis not present

## 2022-01-23 DIAGNOSIS — K219 Gastro-esophageal reflux disease without esophagitis: Secondary | ICD-10-CM | POA: Diagnosis not present

## 2022-01-23 DIAGNOSIS — I2510 Atherosclerotic heart disease of native coronary artery without angina pectoris: Secondary | ICD-10-CM | POA: Diagnosis not present

## 2022-01-23 DIAGNOSIS — J309 Allergic rhinitis, unspecified: Secondary | ICD-10-CM | POA: Diagnosis not present

## 2022-02-16 ENCOUNTER — Ambulatory Visit (INDEPENDENT_AMBULATORY_CARE_PROVIDER_SITE_OTHER): Payer: Medicare HMO | Admitting: Family Medicine

## 2022-02-16 DIAGNOSIS — Z0000 Encounter for general adult medical examination without abnormal findings: Secondary | ICD-10-CM | POA: Diagnosis not present

## 2022-02-16 NOTE — Progress Notes (Signed)
MEDICARE ANNUAL WELLNESS VISIT  02/16/2022  Telephone Visit Disclaimer This Medicare AWV was conducted by telephone due to national recommendations for restrictions regarding the COVID-19 Pandemic (e.g. social distancing).  I verified, using two identifiers, that I am speaking with Rachel Carr or their authorized healthcare agent. I discussed the limitations, risks, security, and privacy concerns of performing an evaluation and management service by telephone and the potential availability of an in-person appointment in the future. The patient expressed understanding and agreed to proceed.  Location of Patient: Home Location of Provider (nurse):  In the office.  Subjective:    Rachel Carr is a 71 y.o. female patient of Metheney, Rene Kocher, MD who had a Medicare Annual Wellness Visit today via telephone. [name] is Retired and lives with their spouse. she has 1 child. she reports that she is socially active and does interact with friends/family regularly. she is minimally physically active and enjoys gardening.  Patient Care Team: Hali Marry, MD as PCP - General (Family Medicine) Stanford Breed Denice Bors, MD as PCP - Cardiology (Cardiology) Rosana Berger, MD as Referring Physician (Gastroenterology)     02/16/2022    2:29 PM 02/12/2021    9:38 AM 12/02/2016    2:14 PM 08/30/2014    1:47 PM  Advanced Directives  Does Patient Have a Medical Advance Directive? No No No No  Would patient like information on creating a medical advance directive? No - Patient declined No - Patient declined No - Patient declined Yes - Educational materials given    Hospital Utilization Over the Past 12 Months: # of hospitalizations or ER visits: 0 # of surgeries: 0  Review of Systems    Patient reports that her overall health is better compared to last year.  History obtained from chart review and the patient  Patient Reported Readings (BP, Pulse, CBG, Weight, etc) none  Pain  Assessment Pain : No/denies pain     Current Medications & Allergies (verified) Allergies as of 02/16/2022       Reactions   Nsaids Other (See Comments)   History of Niesen fundoplication.   Tramadol Nausea Only        Medication List        Accurate as of February 16, 2022  2:39 PM. If you have any questions, ask your nurse or doctor.          albuterol 108 (90 Base) MCG/ACT inhaler Commonly known as: VENTOLIN HFA INHALE 2 PUFFS INTO THE LUNGS EVERY 4 (FOUR) HOURS AS NEEDED FOR WHEEZING   atorvastatin 80 MG tablet Commonly known as: LIPITOR Take 1 tablet (80 mg total) by mouth at bedtime.   carvedilol 6.25 MG tablet Commonly known as: COREG TAKE 1 TABLET BY MOUTH TWICE A DAY WITH FOOD   [facility] Aspirin 81 MG chewable tablet Generic drug: aspirin Chew 1 tablet by mouth.   pantoprazole 40 MG tablet Commonly known as: PROTONIX TAKE 1 TABLET BY MOUTH EVERY DAY        History (reviewed): Past Medical History:  Diagnosis Date   Allergy    seasonal   Arthritis    Barrett's syndrome    CAD (coronary artery disease)    Emphysema of lung ([facility])    Esophageal stricture    Esophageal ulcer 09/2010   gastritis Dr. Glennon Hamilton   Gastric polyps    colonoscopy 2012(due again for repeat 2017)   GERD (gastroesophageal reflux disease)    Shingles    on the  right eye   Past Surgical History:  Procedure Laterality Date   CARPAL TUNNEL RELEASE     CHOLECYSTECTOMY     COMPLETE MASTECTOMY W/ SENTINEL NODE BIOPSY     JOINT REPLACEMENT     NISSEN FUNDOPLICATION  4401   RADIOFREQUENCY ABLATION     RT knee surgery     TOTAL ABDOMINAL HYSTERECTOMY W/ BILATERAL SALPINGOOPHORECTOMY     endometriosis   Family History  Problem Relation Age of Onset   Heart disease Mother    Diabetes Father    Cancer Father        prostrate   Cancer Sister        esophagus   Diabetes Sister    Cancer Brother        kidney   Obesity Neg Hx    Social History   Socioeconomic  History   Marital status: Married    Spouse name: Rachel Carr   Number of children: 1   Years of education: 12   Highest education level: High school graduate  Occupational History    Comment: Retired.  Tobacco Use   Smoking status: Former    Packs/day: 0.50    Years: 20.00    Total pack years: 10.00    Types: Cigarettes    Quit date: 08/11/2015    Years since quitting: 6.5   Smokeless tobacco: Never  Vaping Use   Vaping Use: Former  Substance and Sexual Activity   Alcohol use: Not Currently    Comment: occasionally   Drug use: No   Sexual activity: Yes    Birth control/protection: None  Other Topics Concern   Not on file  Social History Narrative   Lives with her husband. She has one child. She enjoys gardening and working outside.   Social Determinants of Health   Financial Resource Strain: Unknown (02/13/2022)   Overall Financial Resource Strain (CARDIA)    Difficulty of Paying Living Expenses: Patient refused  Food Insecurity: Unknown (02/13/2022)   Hunger Vital Sign    Worried About Running Out of Food in the Last Year: Patient refused    [place] in the Last Year: Patient refused  Transportation Needs: No Transportation Needs (02/13/2022)   PRAPARE - Hydrologist (Medical): No    Lack of Transportation (Non-Medical): No  Physical Activity: Insufficiently Active (02/13/2022)   Exercise Vital Sign    Days of Exercise per Week: 2 days    Minutes of Exercise per Session: 10 min  Stress: No Stress Concern Present (02/12/2021)   [place]    Feeling of Stress : Not at all  Social Connections: Unknown (02/13/2022)   Social Connection and Isolation Panel [NHANES]    Frequency of Communication with Friends and Family: More than three times a week    Frequency of Social Gatherings with Friends and Family: Once a week    Attends Religious Services: Not on file    Active Member of Clubs  or Organizations: Patient refused    Attends Archivist Meetings: Not on file    Marital Status: Married    Activities of Daily Living    02/13/2022    5:07 AM  In your present state of health, do you have any difficulty performing the following activities:  Hearing? 0  Vision? 0  Difficulty concentrating or making decisions? 0  Walking or climbing stairs? 0  Dressing or bathing? 0  Doing errands, shopping?  0  Preparing Food and eating ? N  Using the Toilet? N  In the past six months, have you accidently leaked urine? N  Do you have problems with loss of bowel control? N  Managing your Medications? N  Managing your Finances? N  Housekeeping or managing your Housekeeping? N    Patient Education/ Literacy How often do you need to have someone help you when you read instructions, pamphlets, or other written materials from your doctor or pharmacy?: 1 - Never What is the last grade level you completed in school?: 11th grade  Exercise Current Exercise Habits: Home exercise routine, Type of exercise: walking, Time (Minutes): 10, Frequency (Times/Week): 2, Weekly Exercise (Minutes/Week): 20, Intensity: Moderate, Exercise limited by: orthopedic condition(s) (recent knee replacements.)  Diet Patient reports consuming 2 meals a day and 1 snack(s) a day Patient reports that her primary diet is: Regular Patient reports that she does have regular access to food.   Depression Screen    02/16/2022    2:33 PM 12/29/2021    8:23 AM 02/12/2021    9:38 AM 06/12/2020    9:30 AM 03/23/2019    4:11 PM 10/25/2017    1:29 PM 04/02/2017    3:32 PM  PHQ 2/9 Scores  PHQ - 2 Score 0 2 0 0 0 0 2  PHQ- 9 Score 1 7          Fall Risk    02/13/2022    5:07 AM 12/29/2021    8:23 AM 02/12/2021    9:38 AM 06/12/2020    9:30 AM 03/23/2019    4:10 PM  Fall Risk   Falls in the past year? 0 0 0 0 0  Number falls in past yr:  0 0  0  Injury with Fall?  0 0  0  Risk for fall due to :  No Fall Risks No  Fall Risks No Fall Risks   Follow up  Falls prevention discussed Falls evaluation completed       Objective:  Nitya Cauthon seemed alert and oriented and she participated appropriately during our telephone visit.  Blood Pressure Weight BMI  BP Readings from Last 3 Encounters:  12/29/21 119/67  11/30/21 127/84  07/09/21 124/69   Wt Readings from Last 3 Encounters:  12/29/21 171 lb (77.6 kg)  07/09/21 179 lb (81.2 kg)  07/01/21 176 lb (79.8 kg)   BMI Readings from Last 1 Encounters:  12/29/21 32.31 kg/m    *Unable to obtain current vital signs, weight, and BMI due to telephone visit type  Hearing/Vision  Nillie did not seem to have difficulty with hearing/understanding during the telephone conversation Reports that she has had a formal eye exam by an eye care professional within the past year Reports that she has not had a formal hearing evaluation within the past year *Unable to fully assess hearing and vision during telephone visit type  Cognitive Function:    02/16/2022    2:34 PM 02/12/2021    9:48 AM  6CIT Screen  What Year? 0 points 0 points  What month? 0 points 0 points  What time? 0 points 0 points  Count back from 20 0 points 0 points  Months in reverse 0 points 0 points  Repeat phrase 2 points 0 points  Total Score 2 points 0 points   (Normal:0-7, Significant for Dysfunction: >8)  Normal Cognitive Function Screening: Yes   Immunization & Health Maintenance Record Immunization History  Administered Date(s) Administered  Fluad Quad(high Dose 65+) 06/12/2020, 07/01/2021   Influenza, High Dose Seasonal PF 05/03/2019   PNEUMOCOCCAL CONJUGATE-20 12/12/2020   Tdap 11/02/2019    Health Maintenance  Topic Date Due   COVID-19 Vaccine (1) 03/04/2022 (Originally 05/24/1951)   Zoster Vaccines- Shingrix (1 of 2) 05/19/2022 (Originally 11/21/2000)   INFLUENZA VACCINE  03/10/2022   DEXA SCAN  11/04/2022   MAMMOGRAM  03/13/2023   COLONOSCOPY (Pts 45-54yr  Insurance coverage will need to be confirmed)  06/07/2025   TETANUS/TDAP  11/01/2029   Pneumonia Vaccine 71 Years old  Completed   Hepatitis C Screening  Completed   HPV VACCINES  Aged Out       Assessment  This is a routine wellness examination for [employer]  Health Maintenance: Due or Overdue There are no preventive care reminders to display for this patient.   RPercell Locusdoes not need a referral for Community Assistance: Care Management:   no Social Work:    no Prescription Assistance:  no Nutrition/Diabetes Education:  no   Plan:  Personalized Goals  Goals Addressed               This Visit's Progress     Patient Stated (pt-stated)        Would like to loose 20 lbs.       Personalized Health Maintenance & Screening Recommendations  Shingrix vaccine Mammogram due in August.  Lung Cancer Screening Recommended: no (Low Dose CT Chest recommended if Age 71-80years, 30 pack-year currently smoking OR have quit w/in past 15 years) Hepatitis C Screening recommended: no HIV Screening recommended: no  Advanced Directives: Written information was not prepared per patient's request.  Referrals & Orders No orders of the defined types were placed in this encounter.   Follow-up Plan Follow-up with MHali Marry MD as planned Schedule your Shingrix vaccine at your pharmacy. Medicare wellness visit in one year. Patient will access AVS on my chart.   I have personally reviewed and noted the following in the patient's chart:   Medical and social history Use of alcohol, tobacco or illicit drugs  Current medications and supplements Functional ability and status Nutritional status Physical activity Advanced directives List of other physicians Hospitalizations, surgeries, and ER visits in previous 12 months Vitals Screenings to include cognitive, depression, and falls Referrals and appointments  In addition, I have reviewed and  discussed with RPercell Locuscertain preventive protocols, quality metrics, and best practice recommendations. A written personalized care plan for preventive services as well as general preventive health recommendations is available and can be mailed to the patient at her request.      BTinnie Gens RN BSN  02/16/2022

## 2022-02-16 NOTE — Patient Instructions (Addendum)
[place] Maintenance Summary and Written Plan of Care  Ms. Rachel Carr ,  Thank you for allowing me to perform your Medicare Annual Wellness Visit and for your ongoing commitment to your health.   Health Maintenance & Immunization History Health Maintenance  Topic Date Due  . COVID-19 Vaccine (1) 03/04/2022 (Originally 05/24/1951)  . Zoster Vaccines- Shingrix (1 of 2) 05/19/2022 (Originally 11/21/2000)  . INFLUENZA VACCINE  03/10/2022  . DEXA SCAN  11/04/2022  . MAMMOGRAM  03/13/2023  . COLONOSCOPY (Pts 45-83yr Insurance coverage will need to be confirmed)  06/07/2025  . TETANUS/TDAP  11/01/2029  . Pneumonia Vaccine 71 Years old  Completed  . Hepatitis C Screening  Completed  . HPV VACCINES  Aged Out   Immunization History  Administered Date(s) Administered  . Fluad Quad(high Dose 65+) 06/12/2020, 07/01/2021  . Influenza, High Dose Seasonal PF 05/03/2019  . PNEUMOCOCCAL CONJUGATE-20 12/12/2020  . Tdap 11/02/2019    These are the patient goals that we discussed:  Goals Addressed              This Visit's Progress   .  Patient Stated (pt-stated)        Would like to loose 20 lbs.        This is a list of Health Maintenance Items that are overdue or due now: Shingrix vaccine Mammogram due in August.  Orders/Referrals Placed Today: No orders of the defined types were placed in this encounter.  (Contact our referral department at [phone] you have not spoken with someone about your referral appointment within the next 5 days)    Follow-up Plan Follow-up with MHali Marry MD as planned Schedule your Shingrix vaccine at your pharmacy. Medicare wellness visit in one year. Patient will access AVS on my chart.      Health Maintenance, Female Adopting a healthy lifestyle and getting preventive care are important in promoting health and wellness. Ask your health care provider about: The right schedule for you to have  regular tests and exams. Things you can do on your own to prevent diseases and keep yourself healthy. What should I know about diet, weight, and exercise? Eat a healthy diet  Eat a diet that includes plenty of vegetables, fruits, low-fat dairy products, and lean protein. Do not eat a lot of foods that are high in solid fats, added sugars, or sodium. Maintain a healthy weight Body mass index (BMI) is used to identify weight problems. It estimates body fat based on height and weight. Your health care provider can help determine your BMI and help you achieve or maintain a healthy weight. Get regular exercise Get regular exercise. This is one of the most important things you can do for your health. Most adults should: Exercise for at least 150 minutes each week. The exercise should increase your heart rate and make you sweat (moderate-intensity exercise). Do strengthening exercises at least twice a week. This is in addition to the moderate-intensity exercise. Spend less time sitting. Even light physical activity can be beneficial. Watch cholesterol and blood lipids Have your blood tested for lipids and cholesterol at 71years of age, then have this test every 5 years. Have your cholesterol levels checked more often if: Your lipid or cholesterol levels are high. You are older than 71years of age. You are at high risk for heart disease. What should I know about cancer screening? Depending on your health history and family history, you may need to have cancer screening  at various ages. This may include screening for: Breast cancer. Cervical cancer. Colorectal cancer. Skin cancer. Lung cancer. What should I know about heart disease, diabetes, and high blood pressure? Blood pressure and heart disease High blood pressure causes heart disease and increases the risk of stroke. This is more likely to develop in people who have high blood pressure readings or are overweight. Have your blood pressure  checked: Every 3-5 years if you are 36-78 years of age. Every year if you are 37 years old or older. Diabetes Have regular diabetes screenings. This checks your fasting blood sugar level. Have the screening done: Once every three years after age 31 if you are at a normal weight and have a low risk for diabetes. More often and at a younger age if you are overweight or have a high risk for diabetes. What should I know about preventing infection? Hepatitis B If you have a higher risk for hepatitis B, you should be screened for this virus. Talk with your health care provider to find out if you are at risk for hepatitis B infection. Hepatitis C Testing is recommended for: Everyone born from [birth year] through 1965. Anyone with known risk factors for hepatitis C. Sexually transmitted infections (STIs) Get screened for STIs, including gonorrhea and chlamydia, if: You are sexually active and are younger than 71 years of age. You are older than 71 years of age and your health care provider tells you that you are at risk for this type of infection. Your sexual activity has changed since you were last screened, and you are at increased risk for chlamydia or gonorrhea. Ask your health care provider if you are at risk. Ask your health care provider about whether you are at high risk for HIV. Your health care provider may recommend a prescription medicine to help prevent HIV infection. If you choose to take medicine to prevent HIV, you should first get tested for HIV. You should then be tested every 3 months for as long as you are taking the medicine. Pregnancy If you are about to stop having your period (premenopausal) and you may become pregnant, seek counseling before you get pregnant. Take 400 to 800 micrograms (mcg) of folic acid every day if you become pregnant. Ask for birth control (contraception) if you want to prevent pregnancy. Osteoporosis and menopause Osteoporosis is a disease in which the bones  lose minerals and strength with aging. This can result in bone fractures. If you are 30 years old or older, or if you are at risk for osteoporosis and fractures, ask your health care provider if you should: Be screened for bone loss. Take a calcium or vitamin D supplement to lower your risk of fractures. Be given hormone replacement therapy (HRT) to treat symptoms of menopause. Follow these instructions at home: Alcohol use Do not drink alcohol if: Your health care provider tells you not to drink. You are pregnant, may be pregnant, or are planning to become pregnant. If you drink alcohol: Limit how much you have to: 0-1 drink a day. Know how much alcohol is in your drink. In the [place] one drink equals one 12 oz bottle of beer (355 mL), one 5 oz glass of wine (148 mL), or one 1 oz glass of hard liquor (44 mL). Lifestyle Do not use any products that contain nicotine or tobacco. These products include cigarettes, chewing tobacco, and vaping devices, such as e-cigarettes. If you need help quitting, ask your health care provider. Do not use street  drugs. Do not share needles. Ask your health care provider for help if you need support or information about quitting drugs. General instructions Schedule regular health, dental, and eye exams. Stay current with your vaccines. Tell your health care provider if: You often feel depressed. You have ever been abused or do not feel safe at home. Summary Adopting a healthy lifestyle and getting preventive care are important in promoting health and wellness. Follow your health care provider's instructions about healthy diet, exercising, and getting tested or screened for diseases. Follow your health care provider's instructions on monitoring your cholesterol and blood pressure. This information is not intended to replace advice given to you by your health care provider. Make sure you discuss any questions you have with your health care provider. Document  Revised: 12/16/2020 Document Reviewed: 12/16/2020 Elsevier Patient Education  [employer].

## 2022-02-24 ENCOUNTER — Other Ambulatory Visit: Payer: Self-pay | Admitting: Family Medicine

## 2022-02-24 DIAGNOSIS — R7301 Impaired fasting glucose: Secondary | ICD-10-CM

## 2022-03-13 ENCOUNTER — Other Ambulatory Visit: Payer: Self-pay | Admitting: Family Medicine

## 2022-03-13 DIAGNOSIS — R7301 Impaired fasting glucose: Secondary | ICD-10-CM

## 2022-04-24 ENCOUNTER — Ambulatory Visit (INDEPENDENT_AMBULATORY_CARE_PROVIDER_SITE_OTHER): Payer: Medicare HMO

## 2022-04-24 DIAGNOSIS — Z122 Encounter for screening for malignant neoplasm of respiratory organs: Secondary | ICD-10-CM

## 2022-04-24 DIAGNOSIS — Z87891 Personal history of nicotine dependence: Secondary | ICD-10-CM

## 2022-04-28 ENCOUNTER — Other Ambulatory Visit: Payer: Self-pay

## 2022-04-28 DIAGNOSIS — Z87891 Personal history of nicotine dependence: Secondary | ICD-10-CM

## 2022-04-28 DIAGNOSIS — Z122 Encounter for screening for malignant neoplasm of respiratory organs: Secondary | ICD-10-CM

## 2022-05-01 ENCOUNTER — Encounter: Payer: Self-pay | Admitting: Family Medicine

## 2022-05-01 DIAGNOSIS — I700 Atherosclerosis of aorta: Secondary | ICD-10-CM | POA: Insufficient documentation

## 2022-05-06 ENCOUNTER — Other Ambulatory Visit: Payer: Self-pay | Admitting: Family Medicine

## 2022-05-06 DIAGNOSIS — Z1231 Encounter for screening mammogram for malignant neoplasm of breast: Secondary | ICD-10-CM

## 2022-05-13 ENCOUNTER — Ambulatory Visit: Payer: Medicare HMO

## 2022-05-13 ENCOUNTER — Ambulatory Visit (INDEPENDENT_AMBULATORY_CARE_PROVIDER_SITE_OTHER): Payer: Medicare HMO

## 2022-05-13 DIAGNOSIS — Z1231 Encounter for screening mammogram for malignant neoplasm of breast: Secondary | ICD-10-CM

## 2022-05-14 NOTE — Progress Notes (Signed)
Please call patient. Normal mammogram.  Repeat in 1 year.  

## 2022-05-21 ENCOUNTER — Ambulatory Visit
Admission: EM | Admit: 2022-05-21 | Discharge: 2022-05-21 | Disposition: A | Discharge status: Home / Self Care | Payer: Medicare HMO | Attending: Family Medicine | Admitting: Family Medicine

## 2022-05-21 DIAGNOSIS — J309 Allergic rhinitis, unspecified: Secondary | ICD-10-CM

## 2022-05-21 DIAGNOSIS — J3489 Other specified disorders of nose and nasal sinuses: Secondary | ICD-10-CM | POA: Diagnosis not present

## 2022-05-21 DIAGNOSIS — J0100 Acute maxillary sinusitis, unspecified: Secondary | ICD-10-CM | POA: Diagnosis not present

## 2022-05-21 MED ORDER — FEXOFENADINE HCL 180 MG PO TABS: 180.0000 mg | ORAL_TABLET | Freq: Every day | ORAL | 0 refills | Status: DC

## 2022-05-21 MED ORDER — DOXYCYCLINE HYCLATE 100 MG PO CAPS: 100.0000 mg | ORAL_CAPSULE | Freq: Two times a day (BID) | ORAL | 0 refills | Status: AC

## 2022-05-21 MED ORDER — PREDNISONE 10 MG (21) PO TBPK: ORAL_TABLET | Freq: Every day | ORAL | 0 refills | Status: DC

## 2022-05-21 NOTE — ED Triage Notes (Signed)
Pt c/o nasal congestion, facial and ear pain since Monday. Taking meclizine prn.

## 2022-05-21 NOTE — ED Provider Notes (Signed)
Rachel [facility]    CSN: [id number] Arrival date & time: 05/21/22  1103      History   Chief Complaint Chief Complaint  Patient presents with   Nasal Congestion   Facial Pain   Otalgia    HPI Rachel Carr is a 71 y.o. female.   HPI Very pleasant 71 year old female presents with facial pain, bilateral ear pain, nasal congestion and 5-6 days.  PMH significant for emphysema, CAD, and gastric polyps.  Past Medical History:  Diagnosis Date   Allergy    seasonal   Arthritis    Barrett's syndrome    CAD (coronary artery disease)    Emphysema of lung ([place])    Esophageal stricture    Esophageal ulcer 09/2010   gastritis Dr. Glennon Hamilton   Gastric polyps    colonoscopy 2012(due again for repeat 2017)   GERD (gastroesophageal reflux disease)    Shingles    on the right eye    Patient Active Problem List   Diagnosis Date Noted   Aortic atherosclerosis ([place]) 05/01/2022   Constipation 07/01/2021   Pulmonary emphysema ([place]) 07/01/2021   Bilateral primary osteoarthritis of knee 11/26/2016   CAD S/P percutaneous coronary angioplasty 09/29/2016   Unstable angina ([place]) 02/27/2016   IFG (impaired fasting glucose) 11/20/2015   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 03/17/2012   Regurgitation 12/10/2011   Obese 10/14/2011   Barrett's esophagus 01/13/2011   Tobacco use disorder 01/02/2011   COLONIC POLYPS 10/10/2010   ULCER OF ESOPHAGUS WITHOUT BLEEDING 10/10/2010   GERD 08/15/2010   DIAPHRAGMAT HERN W/O MENTION OBSTRUCTION/GANGREN 08/12/2010   HERPES ZOSTER WITHOUT MENTION OF COMPLICATION 84/16/6063    Past Surgical History:  Procedure Laterality Date   CARPAL TUNNEL RELEASE     CHOLECYSTECTOMY     COMPLETE MASTECTOMY W/ SENTINEL NODE BIOPSY     JOINT REPLACEMENT     NISSEN FUNDOPLICATION  0160   RADIOFREQUENCY ABLATION     RT knee surgery     TOTAL ABDOMINAL HYSTERECTOMY W/ BILATERAL SALPINGOOPHORECTOMY     endometriosis     OB History   No obstetric history on file.      Home Medications    Prior to Admission medications   Medication Sig Start Date End Date Taking? Authorizing Provider  doxycycline (VIBRAMYCIN) 100 MG capsule Take 1 capsule (100 mg total) by mouth 2 (two) times daily for 10 days. 05/21/22 05/31/22 Yes Eliezer Lofts, FNP  fexofenadine [facility] ALLERGY) 180 MG tablet Take 1 tablet (180 mg total) by mouth daily for 15 days. 05/21/22 06/05/22 Yes Eliezer Lofts, FNP  predniSONE (STERAPRED UNI-PAK 21 TAB) 10 MG (21) TBPK tablet Take by mouth daily. Take 6 tabs by mouth daily  for 2 days, then 5 tabs for 2 days, then 4 tabs for 2 days, then 3 tabs for 2 days, 2 tabs for 2 days, then 1 tab by mouth daily for 2 days 05/21/22  Yes Eliezer Lofts, FNP  albuterol (VENTOLIN HFA) 108 (90 Base) MCG/ACT inhaler INHALE 2 PUFFS INTO THE LUNGS EVERY 4 (FOUR) HOURS AS NEEDED FOR WHEEZING 02/14/19   Hali Marry, MD  atorvastatin (LIPITOR) 80 MG tablet Take 1 tablet (80 mg total) by mouth at bedtime. 01/15/22   Hali Marry, MD  carvedilol (COREG) 6.25 MG tablet TAKE 1 TABLET BY MOUTH TWICE A DAY WITH FOOD 11/27/21   Hali Marry, MD  [facility] ASPIRIN 81 MG chewable tablet Chew 1 tablet by mouth. 02/28/16  [provider]  metFORMIN (GLUCOPHAGE-XR) 500 MG 24 hr tablet TAKE 1 TABLET BY MOUTH EVERY DAY WITH BREAKFAST 03/13/22   Hali Marry, MD  pantoprazole (PROTONIX) 40 MG tablet TAKE 1 TABLET BY MOUTH EVERY DAY 12/12/21   Hali Marry, MD    Family History Family History  Problem Relation Age of Onset   Heart disease Mother    Diabetes Father    Cancer Father        prostrate   Cancer Sister        esophagus   Diabetes Sister    Cancer Brother        kidney   Obesity Neg Hx     Social History Social History   Tobacco Use   Smoking status: Former    Packs/day: 0.50    Years: 20.00    Total pack years: 10.00    Types: Cigarettes    Quit  date: 08/11/2015    Years since quitting: 6.7   Smokeless tobacco: Never  Vaping Use   Vaping Use: Former  Substance Use Topics   Alcohol use: Not Currently    Comment: occasionally   Drug use: No     Allergies   Nsaids and Tramadol   Review of Systems Review of Systems  HENT:  Positive for congestion, ear pain, sinus pressure and sinus pain.   All other systems reviewed and are negative.    Physical Exam Triage Vital Signs ED Triage Vitals  Enc Vitals Group     BP      Pulse      Resp      Temp      Temp src      SpO2      Weight      Height      Head Circumference      Peak Flow      Pain Score      Pain Loc      Pain Edu?      Excl. in [place]?    No data found.  Updated Vital Signs BP 127/79   Pulse 68   Temp 98.6 F (37 C) (Oral)   Resp 17   SpO2 99%       Physical Exam Vitals and nursing note reviewed.  Constitutional:      General: She is not in acute distress.    Appearance: Normal appearance. She is obese. She is not ill-appearing.  HENT:     Head: Normocephalic and atraumatic.     Right Ear: Tympanic membrane and external ear normal.     Left Ear: Tympanic membrane and external ear normal.     Ears:     Comments: Significant eustachian tube dysfunction noted bilaterally    Nose: Rhinorrhea present.     Right Sinus: Maxillary sinus tenderness present.     Left Sinus: Maxillary sinus tenderness present.     Comments: Turbinates are erythematous/edematous    Mouth/Throat:     Mouth: Mucous membranes are moist.     Pharynx: Oropharynx is clear.     Comments: Significant amount of clear drainage of posterior oropharynx noted Eyes:     Extraocular Movements: Extraocular movements intact.     Conjunctiva/sclera: Conjunctivae normal.     Pupils: Pupils are equal, round, and reactive to light.  Cardiovascular:     Rate and Rhythm: Normal rate and regular rhythm.     Pulses: Normal pulses.     Heart sounds: Normal heart sounds.  Pulmonary:      Effort: Pulmonary effort is normal.     Breath sounds: Normal breath sounds. No wheezing, rhonchi or rales.  Musculoskeletal:        General: Normal range of motion.     Cervical back: Normal range of motion and neck supple.  Skin:    General: Skin is warm and dry.  Neurological:     General: No focal deficit present.     Mental Status: She is alert and oriented to person, place, and time.      UC Treatments / Results  Labs (all labs ordered are listed, but only abnormal results are displayed) Labs Reviewed - No data to display  EKG   Radiology No results found.  Procedures Procedures (including critical care time)  Medications Ordered in UC Medications - No data to display  Initial Impression / Assessment and Plan / UC Course  I have reviewed the triage vital signs and the nursing notes.  Pertinent labs & imaging results that were available during my care of the patient were reviewed by me and considered in my medical decision making (see chart for details).     MDM: 1.  Acute maxillary sinusitis, recurrence not specified-Rx'd doxycycline; 2.  Sinus pressure-Rx'd Sterapred Unipak; 3.  Allergic rhinitis-Rx'd Allegra. Instructed patient to take medication as directed with food to completion.  Advised patient to take Sterapred Unipak and Allegra with first dose of Doxycycline for the next 10 days.  Advised may discontinue Allegra after 5 days and use as needed for concurrent postnasal drip/drainage.  Encouraged patient to increase daily water intake to 64 ounces per day while taking these medications.  Advised if symptoms worsen and/or unresolved please follow-up with PCP or here for further evaluation.  Patient discharged home, hemodynamically stable. Final Clinical Impressions(s) / UC Diagnoses   Final diagnoses:  Acute maxillary sinusitis, recurrence not specified  Allergic rhinitis, unspecified seasonality, unspecified trigger  Sinus pressure     Discharge  Instructions      Instructed patient to take medication as directed with food to completion.  Advised patient to take Sterapred Unipak and Allegra with first dose of Doxycycline for the next 10 days.  Advised may discontinue Allegra after 5 days and use as needed for concurrent postnasal drip/drainage.  Encouraged patient to increase daily water intake to 64 ounces per day while taking these medications.  Advised if symptoms worsen and/or unresolved please follow-up with PCP or here for further evaluation.     ED Prescriptions     Medication Sig Dispense Auth. Provider   doxycycline (VIBRAMYCIN) 100 MG capsule Take 1 capsule (100 mg total) by mouth 2 (two) times daily for 10 days. 20 capsule Eliezer Lofts, FNP   predniSONE (STERAPRED UNI-PAK 21 TAB) 10 MG (21) TBPK tablet Take by mouth daily. Take 6 tabs by mouth daily  for 2 days, then 5 tabs for 2 days, then 4 tabs for 2 days, then 3 tabs for 2 days, 2 tabs for 2 days, then 1 tab by mouth daily for 2 days 42 tablet Eliezer Lofts, FNP   fexofenadine [facility] ALLERGY) 180 MG tablet Take 1 tablet (180 mg total) by mouth daily for 15 days. 15 tablet Eliezer Lofts, FNP      PDMP not reviewed this encounter.   Eliezer Lofts, [place] 05/21/22 1627

## 2022-05-21 NOTE — Discharge Instructions (Addendum)
Instructed patient to take medication as directed with food to completion.  Advised patient to take Sterapred Unipak and Allegra with first dose of Doxycycline for the next 10 days.  Advised may discontinue Allegra after 5 days and use as needed for concurrent postnasal drip/drainage.  Encouraged patient to increase daily water intake to 64 ounces per day while taking these medications.  Advised if symptoms worsen and/or unresolved please follow-up with PCP or here for further evaluation.

## 2022-05-22 ENCOUNTER — Telehealth: Payer: Self-pay | Admitting: Emergency Medicine

## 2022-05-22 NOTE — Telephone Encounter (Signed)
Not any better today

## 2022-05-24 ENCOUNTER — Other Ambulatory Visit: Payer: Self-pay | Admitting: Family Medicine

## 2022-07-09 ENCOUNTER — Encounter: Payer: Self-pay | Admitting: Family Medicine

## 2022-07-09 ENCOUNTER — Ambulatory Visit (INDEPENDENT_AMBULATORY_CARE_PROVIDER_SITE_OTHER): Payer: Medicare HMO | Admitting: Family Medicine

## 2022-07-09 VITALS — BP 123/85 | HR 68 | Wt 174.0 lb

## 2022-07-09 DIAGNOSIS — H6122 Impacted cerumen, left ear: Secondary | ICD-10-CM | POA: Diagnosis not present

## 2022-07-09 DIAGNOSIS — M2550 Pain in unspecified joint: Secondary | ICD-10-CM

## 2022-07-09 DIAGNOSIS — M791 Myalgia, unspecified site: Secondary | ICD-10-CM

## 2022-07-09 DIAGNOSIS — H8113 Benign paroxysmal vertigo, bilateral: Secondary | ICD-10-CM | POA: Diagnosis not present

## 2022-07-09 NOTE — Progress Notes (Signed)
   Acute Office Visit  Subjective:     Patient ID: Rachel Carr, female    DOB: March 15, 1951, 71 y.o.   MRN: [id number]  Chief Complaint  Patient presents with   Follow-up         HPI Patient is a 71 year old female presenting to clinic to discuss dizziness and joint and muscle pain. These symptoms began around summer time and "felt like I had COVID or flu but didn't." The joint pain is daily however some days are worse than others. Pain is worse in morning and better as the day goes on. She has noticed some swelling in her hand joints. She has a heated mattress that helps with pain relief. Tylenol also helps. Sister has rheumatoid arthritis. She is unable to walk as much as she used to on the treadmill because of the pain. Denies any trauma or tick bites.   Dizziness started over the summer as well. Last episode was 2 days ago lasting a few seconds or minutes. It feels like she is drunk but doesn't drink alcohol. Worse when she bends over and stands back up or when she stands up too fast. She does check her blood pressures during these spells and they are usually around 120s. Denies any nausea or vomiting.   Review of Systems  Gastrointestinal:  Negative for nausea and vomiting.  Musculoskeletal:  Positive for joint pain and myalgias.  Neurological:  Positive for dizziness.  All other systems reviewed and are negative.        Objective:    BP 123/85   Pulse 68   Wt 78.9 kg   SpO2 99% Comment: 3L  BMI 32.88 kg/m    Physical Exam Constitutional:      Appearance: Normal appearance.  HENT:     Right Ear: Tympanic membrane normal.     Left Ear: There is impacted cerumen.  Cardiovascular:     Rate and Rhythm: Normal rate and regular rhythm.  Pulmonary:     Effort: Pulmonary effort is normal.     Breath sounds: Normal breath sounds.  Musculoskeletal:        General: Swelling present.     Comments: PIP swelling of third digit bilaterally  Neurological:     Mental  Status: She is alert.   Positive Oncologist & Plan:   Problem List Items Addressed This Visit       Nervous and Auditory   Left ear impacted cerumen   Benign paroxysmal positional vertigo, bilateral - Primary     Other   Arthralgia   Myalgia    Dizziness - Positive Dix Hallpike Maneuver indicating benign paroxysmal positional vertigo. Referring to PT vestibular rehab.  Joint and Bone Pain -  Likely rheumatoid arthritis given family history and characteristics of pain. Ordering rheumatoid factor. Continue symptom relief with tylenol and heat as needed.  Myalgia Ordering CK level. Continue symptom management with tylenol and heat as needed.  Left Cerumen Impaction - flushed and cleaned today in office.  Follow up or call office as needed for worsening or persistent symptoms.      Dorian Heckle, Student-PA

## 2022-07-09 NOTE — Progress Notes (Signed)
Medical screening examination/treatment was performed by qualified clinical staff member and as supervising physician I also interviewed and examined the patient. I have reviewed documentation and agree with assessment and plan.  Swelling of the 3rd PIPs on each hand with synovitis on the right hand.  Dix-Hallpike maneuver performed.  She did have some mild vertigo symptoms after sitting up after performing the maneuver to the right.  But she actually experienced symptoms to the left side during the maneuver but I was unable to appreciate any nystagmus.  Indication: Cerumen impaction of the ear(s) Medical necessity statement: On physical examination, cerumen impairs clinically significant portions of the external auditory canal, and tympanic membrane. Noted obstructive, copious cerumen that cannot be removed without magnification  Consent: Discussed benefits and risks of procedure and verbal consent obtained Procedure: Patient was prepped for the procedure. Utilized an otoscope to assess and take note of the ear canal, the tympanic membrane, and the presence, amount, and placement of the cerumen. Gentle water irrigation and soft plastic curette was utilized to remove cerumen.  Post procedure examination: shows cerumen was completely removed. Patient tolerated procedure well. The patient is made aware that they may experience temporary vertigo, temporary hearing loss, and temporary discomfort. If these symptom last for more than 24 hours to call the clinic or proceed to the ED.    Beatrice Lecher, MD

## 2022-07-09 NOTE — Progress Notes (Signed)
  Pt reports that since about the 2nd or 3rd week of June she has had episodes of dizziness that would last from seconds up to about 1 minute. Also during this time she began to get some aches in her bones like she had the flu. She said that this got better

## 2022-08-20 ENCOUNTER — Other Ambulatory Visit: Payer: Self-pay | Admitting: Family Medicine

## 2022-08-24 NOTE — Progress Notes (Signed)
HPI: FU CAD. Previously followed at [facility]. Cath 7/17 showed 99 RCA and no other obstructive CAD; had PCI of RCA with DES. Last echo 7/17 showed normal LV function.  ABIs March 2021 normal with abnormal toe brachial index.  Nuclear study 11/21 showed EF 78 and no ischemia or infarction. Since last seen, the patient has dyspnea with more extreme activities but not with routine activities. It is relieved with rest. It is not associated with chest pain. There is no orthopnea, PND or pedal edema. There is no syncope or palpitations. There is no exertional chest pain.   Current Outpatient Medications  Medication Sig Dispense Refill   albuterol (VENTOLIN HFA) 108 (90 Base) MCG/ACT inhaler INHALE 2 PUFFS INTO THE LUNGS EVERY 4 (FOUR) HOURS AS NEEDED FOR WHEEZING 18 g prn   atorvastatin (LIPITOR) 80 MG tablet Take 1 tablet (80 mg total) by mouth at bedtime. 100 tablet 3   carvedilol (COREG) 6.25 MG tablet TAKE 1 TABLET (6.25 MG TOTAL) BY MOUTH 2 (TWO) TIMES DAILY WITH A MEAL. NEEDS APPOINTMENT. 180 tablet 0   [facility] CH[facility] ASPIRIN 81 MG chewable tablet Chew 1 tablet by mouth.  11   pantoprazole (PROTONIX) 40 MG tablet TAKE 1 TABLET BY MOUTH EVERY DAY 90 tablet 3   No current facility-administered medications for this visit.     Past Medical History:  Diagnosis Date   Allergy    seasonal   Arthritis    Barrett's syndrome    CAD (coronary artery disease)    Emphysema of lung ([place])    Esophageal stricture    Esophageal ulcer 09/2010   gastritis Dr. Glennon Hamilton   Gastric polyps    colonoscopy 2012(due again for repeat 2017)   GERD (gastroesophageal reflux disease)    Shingles    on the right eye    Past Surgical History:  Procedure Laterality Date   CARPAL TUNNEL RELEASE     CHOLECYSTECTOMY     COMPLETE MASTECTOMY W/ SENTINEL NODE BIOPSY     JOINT REPLACEMENT     NISSEN FUNDOPLICATION  0488   RADIOFREQUENCY ABLATION     RT knee surgery     TOTAL ABDOMINAL HYSTERECTOMY W/ BILATERAL  SALPINGOOPHORECTOMY     endometriosis    Social History   Socioeconomic History   Marital status: Married    Spouse name: Percell Miller   Number of children: 1   Years of education: 12   Highest education level: High school graduate  Occupational History    Comment: Retired.  Tobacco Use   Smoking status: Former    Packs/day: 0.50    Years: 20.00    Total pack years: 10.00    Types: Cigarettes    Quit date: 08/11/2015    Years since quitting: 7.0   Smokeless tobacco: Never  Vaping Use   Vaping Use: Former  Substance and Sexual Activity   Alcohol use: Not Currently    Comment: occasionally   Drug use: No   Sexual activity: Yes    Birth control/protection: None  Other Topics Concern   Not on file  Social History Narrative   Lives with her husband. She has one child. She enjoys gardening and working outside.   Social Determinants of Health   Financial Resource Strain: Unknown (02/13/2022)   Overall Financial Resource Strain (CARDIA)    Difficulty of Paying Living Expenses: Patient refused  Food Insecurity: Unknown (02/13/2022)   Hunger Vital Sign    Worried About Charity fundraiser in  the Last Year: Patient refused    [place] in the Last Year: Patient refused  Transportation Needs: No Transportation Needs (02/13/2022)   PRAPARE - Hydrologist (Medical): No    Lack of Transportation (Non-Medical): No  Physical Activity: Insufficiently Active (02/13/2022)   Exercise Vital Sign    Days of Exercise per Week: 2 days    Minutes of Exercise per Session: 10 min  Stress: No Stress Concern Present (02/12/2021)   [place]    Feeling of Stress : Not at all  Social Connections: Unknown (02/13/2022)   Social Connection and Isolation Panel [NHANES]    Frequency of Communication with Friends and Family: More than three times a week    Frequency of Social Gatherings with Friends and Family: Once  a week    Attends Religious Services: Not on Advertising copywriter or Organizations: Patient refused    Attends Archivist Meetings: Not on file    Marital Status: Married  Intimate Partner Violence: Not At Risk (02/16/2022)   Humiliation, Afraid, Rape, and Kick questionnaire    Fear of Current or Ex-Partner: No    Emotionally Abused: No    Physically Abused: No    Sexually Abused: No    Family History  Problem Relation Age of Onset   Heart disease Mother    Diabetes Father    Cancer Father        prostrate   Cancer Sister        esophagus   Diabetes Sister    Cancer Brother        kidney   Obesity Neg Hx     ROS: no fevers or chills, productive cough, hemoptysis, dysphasia, odynophagia, melena, hematochezia, dysuria, hematuria, rash, seizure activity, orthopnea, PND, pedal edema, claudication. Remaining systems are negative.  Physical Exam: Well-developed well-nourished in no acute distress.  Skin is warm and dry.  HEENT is normal.  Neck is supple.  Chest is clear to auscultation with normal expansion.  Cardiovascular exam is regular rate and rhythm.  Abdominal exam nontender or distended. No masses palpated. Extremities show no edema. neuro grossly intact  ECG-normal sinus rhythm at a rate of 72, no ST changes.  Personally reviewed  A/P  1 coronary artery disease-patient doing well with no chest pain.  Continue medical therapy including aspirin and statin.  2 hypertension-blood pressure controlled.  Continue present medical regimen.  3 hyperlipidemia-continue statin.  Kirk Ruths, MD

## 2022-09-07 ENCOUNTER — Ambulatory Visit: Payer: Medicare HMO | Admitting: Cardiology

## 2022-09-07 ENCOUNTER — Encounter: Payer: Self-pay | Admitting: Cardiology

## 2022-09-07 VITALS — BP 128/76 | HR 72 | Ht 61.0 in | Wt 177.4 lb

## 2022-09-07 DIAGNOSIS — E7800 Pure hypercholesterolemia, unspecified: Secondary | ICD-10-CM

## 2022-09-07 DIAGNOSIS — I10 Essential (primary) hypertension: Secondary | ICD-10-CM

## 2022-09-07 DIAGNOSIS — I2510 Atherosclerotic heart disease of native coronary artery without angina pectoris: Secondary | ICD-10-CM

## 2022-09-07 NOTE — Patient Instructions (Signed)
  Lab Work:  Your physician recommends that you return for lab work FASTING  If you have labs (blood work) drawn today and your tests are completely normal, you will receive your results only by: [place] (if you have [place]) OR A paper copy in the mail If you have any lab test that is abnormal or we need to change your treatment, we will call you to review the results.   Follow-Up: At [facility], you and your health needs are our priority.  As part of our continuing mission to provide you with exceptional heart care, we have created designated Provider Care Teams.  These Care Teams include your primary Cardiologist (physician) and Advanced Practice Providers (APPs -  Physician Assistants and Nurse Practitioners) who all work together to provide you with the care you need, when you need it.  We recommend signing up for the patient portal called "MyChart".  Sign up information is provided on this After Visit Summary.  MyChart is used to connect with patients for Virtual Visits (Telemedicine).  Patients are able to view lab/test results, encounter notes, upcoming appointments, etc.  Non-urgent messages can be sent to your provider as well.   To learn more about what you can do with MyChart, go to [email or web address].    Your next appointment:   12 month(s)  Provider:   Kirk Ruths, MD

## 2022-09-11 DIAGNOSIS — E7800 Pure hypercholesterolemia, unspecified: Secondary | ICD-10-CM | POA: Diagnosis not present

## 2022-09-12 LAB — LIPID PANEL
Chol/HDL Ratio: 1.9 ratio (ref 0.0–4.4)
Cholesterol, Total: 128 mg/dL (ref 100–199)
HDL: 66 mg/dL (ref >39)
LDL Chol Calc (NIH): 45 mg/dL (ref 0–99)
Triglycerides: 87 mg/dL (ref 0–149)
VLDL Cholesterol Cal: 17 mg/dL (ref 5–40)

## 2022-09-12 LAB — HEPATIC FUNCTION PANEL
ALT: 11 IU/L (ref 0–32)
AST: 16 IU/L (ref 0–40)
Albumin: 4.3 g/dL (ref 3.8–4.8)
Alkaline Phosphatase: 111 IU/L (ref 44–121)
Bilirubin Total: 0.8 mg/dL (ref 0.0–1.2)
Bilirubin, Direct: 0.23 mg/dL (ref 0.00–0.40)
Total Protein: 6.4 g/dL (ref 6.0–8.5)

## 2022-10-31 ENCOUNTER — Other Ambulatory Visit: Payer: Self-pay

## 2022-10-31 ENCOUNTER — Ambulatory Visit
Admission: EM | Admit: 2022-10-31 | Discharge: 2022-10-31 | Disposition: A | Discharge status: Home / Self Care | Payer: Medicare HMO | Attending: Family Medicine | Admitting: Family Medicine

## 2022-10-31 DIAGNOSIS — M5432 Sciatica, left side: Secondary | ICD-10-CM

## 2022-10-31 DIAGNOSIS — S39012A Strain of muscle, fascia and tendon of lower back, initial encounter: Secondary | ICD-10-CM | POA: Diagnosis not present

## 2022-10-31 MED ORDER — METHYLPREDNISOLONE SODIUM SUCC 125 MG IJ SOLR
125.0000 mg | Freq: Once | INTRAMUSCULAR | Status: AC
  Administered 2022-10-31: 125 mg via INTRAMUSCULAR

## 2022-10-31 MED ORDER — METHOCARBAMOL 500 MG PO TABS: 500.0000 mg | ORAL_TABLET | Freq: Three times a day (TID) | ORAL | 0 refills | Status: DC | PRN

## 2022-10-31 NOTE — ED Provider Notes (Signed)
[facility]    CSN: [id number] Arrival date & time: 10/31/22  1212      History   Chief Complaint Chief Complaint  Patient presents with   Back Pain    LT side    HPI Rachel Carr is a 72 y.o. female.   HPI Very pleasant 72 year old female presents with left-sided lower back pain for 2 days.  Patient denies injury or insult to this affected area reports worsening over the past 24 hours and pain radiates from left lower back to left upper buttocks/left upper hip.  PMH significant for CAD s/p PTCA, obesity, pulmonary emphysema, tobacco use disorder, and aortic atherosclerosis.  Past Medical History:  Diagnosis Date   Allergy    seasonal   Arthritis    Barrett's syndrome    CAD (coronary artery disease)    Emphysema of lung ([place])    Esophageal stricture    Esophageal ulcer 09/2010   gastritis Dr. Glennon Hamilton   Gastric polyps    colonoscopy 2012(due again for repeat 2017)   GERD (gastroesophageal reflux disease)    Shingles    on the right eye    Patient Active Problem List   Diagnosis Date Noted   Left ear impacted cerumen 07/09/2022   Benign paroxysmal positional vertigo, bilateral 07/09/2022   Arthralgia 07/09/2022   Muscle pain 07/09/2022   Myalgia 07/09/2022   Aortic atherosclerosis ([place]) 05/01/2022   Constipation 07/01/2021   Pulmonary emphysema ([place]) 07/01/2021   Bilateral primary osteoarthritis of knee 11/26/2016   CAD S/P percutaneous coronary angioplasty 09/29/2016   Unstable angina ([place]) 02/27/2016   IFG (impaired fasting glucose) 11/20/2015   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 03/17/2012   Regurgitation 12/10/2011   Obese 10/14/2011   Barrett's esophagus 01/13/2011   Tobacco use disorder 01/02/2011   COLONIC POLYPS 10/10/2010   ULCER OF ESOPHAGUS WITHOUT BLEEDING 10/10/2010   GERD 08/15/2010   DIAPHRAGMAT HERN W/O MENTION OBSTRUCTION/GANGREN 08/12/2010   HERPES ZOSTER WITHOUT MENTION OF COMPLICATION  [id number]    Past Surgical History:  Procedure Laterality Date   CARPAL TUNNEL RELEASE     CHOLECYSTECTOMY     COMPLETE MASTECTOMY W/ SENTINEL NODE BIOPSY     JOINT REPLACEMENT     NISSEN FUNDOPLICATION  0000000   RADIOFREQUENCY ABLATION     RT knee surgery     TOTAL ABDOMINAL HYSTERECTOMY W/ BILATERAL SALPINGOOPHORECTOMY     endometriosis    OB History   No obstetric history on file.      Home Medications    Prior to Admission medications   Medication Sig Start Date End Date Taking? Authorizing Provider  methocarbamol (ROBAXIN) 500 MG tablet Take 1 tablet (500 mg total) by mouth 3 (three) times daily as needed. 10/31/22  Yes Eliezer Lofts, FNP  albuterol (VENTOLIN HFA) 108 (90 Base) MCG/ACT inhaler INHALE 2 PUFFS INTO THE LUNGS EVERY 4 (FOUR) HOURS AS NEEDED FOR WHEEZING 02/14/19   Hali Marry, MD  atorvastatin (LIPITOR) 80 MG tablet Take 1 tablet (80 mg total) by mouth at bedtime. 01/15/22   Hali Marry, MD  carvedilol (COREG) 6.25 MG tablet TAKE 1 TABLET (6.25 MG TOTAL) BY MOUTH 2 (TWO) TIMES DAILY WITH A MEAL. NEEDS APPOINTMENT. 08/20/22   Hali Marry, MD  [facility] ASPIRIN 81 MG chewable tablet Chew 1 tablet by mouth. 02/28/16   [provider]  pantoprazole (PROTONIX) 40 MG tablet TAKE 1 TABLET BY MOUTH EVERY DAY 12/12/21   Hali Marry,  MD    Family History Family History  Problem Relation Age of Onset   Heart disease Mother    Diabetes Father    Cancer Father        prostrate   Cancer Sister        esophagus   Diabetes Sister    Cancer Brother        kidney   Obesity Neg Hx     Social History Social History   Tobacco Use   Smoking status: Former    Packs/day: 0.50    Years: 20.00    Additional pack years: 0.00    Total pack years: 10.00    Types: Cigarettes    Quit date: 08/11/2015    Years since quitting: 7.2   Smokeless tobacco: Never  Vaping Use   Vaping Use: Former  Substance Use Topics   Alcohol use:  Not Currently    Comment: occasionally   Drug use: No     Allergies   Nsaids and Tramadol   Review of Systems Review of Systems  Musculoskeletal:  Positive for back pain.  All other systems reviewed and are negative.    Physical Exam Triage Vital Signs ED Triage Vitals [10/31/22 1222]  Enc Vitals Group     BP      Pulse      Resp      Temp      Temp src      SpO2      Weight      Height      Head Circumference      Peak Flow      Pain Score 8     Pain Loc      Pain Edu?      Excl. in [place]?    No data found.  Updated Vital Signs BP 135/84 (BP Location: Right Arm)   Pulse 69   Temp 97.6 F (36.4 C) (Oral)   Resp 17   SpO2 97%    Physical Exam Vitals and nursing note reviewed.  Constitutional:      Appearance: She is obese.  HENT:     Head: Normocephalic and atraumatic.     Right Ear: Tympanic membrane, ear canal and external ear normal.     Left Ear: Tympanic membrane, ear canal and external ear normal.     Mouth/Throat:     Mouth: Mucous membranes are moist.     Pharynx: Oropharynx is clear.  Eyes:     Extraocular Movements: Extraocular movements intact.     Conjunctiva/sclera: Conjunctivae normal.     Pupils: Pupils are equal, round, and reactive to light.  Cardiovascular:     Rate and Rhythm: Normal rate and regular rhythm.     Pulses: Normal pulses.     Heart sounds: Normal heart sounds.  Pulmonary:     Effort: Pulmonary effort is normal.     Breath sounds: Normal breath sounds. No wheezing, rhonchi or rales.  Musculoskeletal:     Cervical back: Normal range of motion and neck supple.     Comments: Left-sided lower back/left superior buttock/left superior hip: Severely TTP with palpable muscle adhesions noted, patient reports radicular/sciatic type pain radiating down left upper leg on exam  Skin:    General: Skin is warm and dry.  Neurological:     General: No focal deficit present.     Mental Status: She is alert and oriented to person,  place, and time. Mental status is at baseline.  UC Treatments / Results  Labs (all labs ordered are listed, but only abnormal results are displayed) Labs Reviewed - No data to display  EKG   Radiology No results found.  Procedures Procedures (including critical care time)  Medications Ordered in UC Medications  methylPREDNISolone sodium succinate (SOLU-MEDROL) 125 mg/2 mL injection 125 mg (125 mg Intramuscular Given 10/31/22 1244)    Initial Impression / Assessment and Plan / UC Course  I have reviewed the triage vital signs and the nursing notes.  Pertinent labs & imaging results that were available during my care of the patient were reviewed by me and considered in my medical decision making (see chart for details).     MDM: 1.  Sciatica of left side-IM Solu-Medrol 125 mg given once in clinic and prior to discharge today; 2.  Strain of lumbar region, initial encounter Rx'd Robaxin 500 mg 3 times daily, as needed. Instructed patient to take Robaxin daily or as needed for accompanying muscle strain muscle spasms of left lower back.  Encouraged patient to increase daily water intake to 64 ounces per day while taking this medication.  Advised if symptoms worsen and/or unresolved please follow-up with PCP or here for further evaluation.  Patient discharged home, hemodynamically stable. Final Clinical Impressions(s) / UC Diagnoses   Final diagnoses:  Strain of lumbar region, initial encounter  Sciatica of left side     Discharge Instructions      Instructed patient to take Robaxin daily or as needed for accompanying muscle strain muscle spasms of left lower back.  Encouraged patient to increase daily water intake to 64 ounces per day while taking this medication.  Advised if symptoms worsen and/or unresolved please follow-up with PCP or here for further evaluation.     ED Prescriptions     Medication Sig Dispense Auth. Provider   methocarbamol (ROBAXIN) 500 MG tablet  Take 1 tablet (500 mg total) by mouth 3 (three) times daily as needed. 30 tablet Eliezer Lofts, FNP      PDMP not reviewed this encounter.   Eliezer Lofts, [place] 10/31/22 1309

## 2022-10-31 NOTE — Discharge Instructions (Addendum)
Instructed patient to take Robaxin daily or as needed for accompanying muscle strain muscle spasms of left lower back.  Encouraged patient to increase daily water intake to 64 ounces per day while taking this medication.  Advised if symptoms worsen and/or unresolved please follow-up with PCP or here for further evaluation.

## 2022-10-31 NOTE — ED Triage Notes (Addendum)
Pt c/o LT sided lower back pain x 2 days. Worsening in last 24 hours. Pain radiates from upper buttocks around LT hip. Denies injury. Ibuprofen and heat prn. Last dose 2 hrs ago.

## 2022-11-12 ENCOUNTER — Other Ambulatory Visit: Payer: Self-pay | Admitting: Family Medicine

## 2022-11-18 ENCOUNTER — Ambulatory Visit (INDEPENDENT_AMBULATORY_CARE_PROVIDER_SITE_OTHER): Payer: Medicare HMO

## 2022-11-18 ENCOUNTER — Ambulatory Visit
Admission: EM | Admit: 2022-11-18 | Discharge: 2022-11-18 | Disposition: A | Discharge status: Home / Self Care | Payer: Medicare HMO | Attending: Family Medicine | Admitting: Family Medicine

## 2022-11-18 DIAGNOSIS — M5432 Sciatica, left side: Secondary | ICD-10-CM

## 2022-11-18 DIAGNOSIS — M545 Low back pain, unspecified: Secondary | ICD-10-CM | POA: Diagnosis not present

## 2022-11-18 DIAGNOSIS — M47816 Spondylosis without myelopathy or radiculopathy, lumbar region: Secondary | ICD-10-CM

## 2022-11-18 MED ORDER — CELECOXIB 100 MG PO CAPS: 100.0000 mg | ORAL_CAPSULE | Freq: Two times a day (BID) | ORAL | 0 refills | Status: AC

## 2022-11-18 MED ORDER — HYDROCODONE-ACETAMINOPHEN 5-325 MG PO TABS: 1.0000 | ORAL_TABLET | Freq: Three times a day (TID) | ORAL | 0 refills | Status: DC | PRN

## 2022-11-18 NOTE — Discharge Instructions (Addendum)
Advised of lumbar spine x-ray results with hardcopy and images provided to patient.  Advised patient to take medication as directed with food to completion.  Advised patient may take Norco for breakthrough low back pain daily, as needed/if needed.  Encouraged to increase daily water intake to 64 ounces per day while taking these medications.  Advised if symptoms worsen and/or unresolved please follow-up with [facility] orthopedic provider, PCP or here for further evaluation.

## 2022-11-18 NOTE — ED Provider Notes (Signed)
[facility]    CSN: [id number] Arrival date & time: 11/18/22  1038      History   Chief Complaint Chief Complaint  Patient presents with   Back Pain    Lower    HPI Rachel Carr is a 72 y.o. female.   HPI Very pleasant 72 year old female presents with lower back pain since midnight last night.  Reports pain radiates down the left buttocks.  Reports was evaluated here on 10/31/2022 and giving a steroid shot patient reports current pain is 9 of 10. PMH significant for CAD, emphysema of lung, and arthritis.  Past Medical History:  Diagnosis Date   Allergy    seasonal   Arthritis    Barrett's syndrome    CAD (coronary artery disease)    Emphysema of lung    Esophageal stricture    Esophageal ulcer 09/2010   gastritis Dr. Jason FilaBray   Gastric polyps    colonoscopy 2012(due again for repeat 2017)   GERD (gastroesophageal reflux disease)    Shingles    on the right eye    Patient Active Problem List   Diagnosis Date Noted   Left ear impacted cerumen 07/09/2022   Benign paroxysmal positional vertigo, bilateral 07/09/2022   Arthralgia 07/09/2022   Muscle pain 07/09/2022   Myalgia 07/09/2022   Aortic atherosclerosis 05/01/2022   Constipation 07/01/2021   Pulmonary emphysema 07/01/2021   Bilateral primary osteoarthritis of knee 11/26/2016   CAD S/P percutaneous coronary angioplasty 09/29/2016   Unstable angina 02/27/2016   IFG (impaired fasting glucose) 11/20/2015   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 03/17/2012   Regurgitation 12/10/2011   Obese 10/14/2011   Barrett's esophagus 01/13/2011   Tobacco use disorder 01/02/2011   COLONIC POLYPS 10/10/2010   ULCER OF ESOPHAGUS WITHOUT BLEEDING 10/10/2010   GERD 08/15/2010   DIAPHRAGMAT HERN W/O MENTION OBSTRUCTION/GANGREN 08/12/2010   HERPES ZOSTER WITHOUT MENTION OF COMPLICATION 04/03/2009    Past Surgical History:  Procedure Laterality Date   CARPAL TUNNEL RELEASE      CHOLECYSTECTOMY     COMPLETE MASTECTOMY W/ SENTINEL NODE BIOPSY     JOINT REPLACEMENT     NISSEN FUNDOPLICATION  2012   RADIOFREQUENCY ABLATION     RT knee surgery     TOTAL ABDOMINAL HYSTERECTOMY W/ BILATERAL SALPINGOOPHORECTOMY     endometriosis    OB History   No obstetric history on file.      Home Medications    Prior to Admission medications   Medication Sig Start Date End Date Taking? Authorizing Provider  celecoxib (CELEBREX) 100 MG capsule Take 1 capsule (100 mg total) by mouth 2 (two) times daily for 15 days. 11/18/22 12/03/22 Yes Trevor Ihaagan, Neysa Arts, FNP  HYDROcodone-acetaminophen (NORCO/VICODIN) 5-325 MG tablet Take 1 tablet by mouth every 8 (eight) hours as needed. 11/18/22  Yes Trevor Ihaagan, Safir Michalec, FNP  albuterol (VENTOLIN HFA) 108 (90 Base) MCG/ACT inhaler INHALE 2 PUFFS INTO THE LUNGS EVERY 4 (FOUR) HOURS AS NEEDED FOR WHEEZING 02/14/19   Agapito GamesMetheney, Catherine D, MD  atorvastatin (LIPITOR) 80 MG tablet Take 1 tablet (80 mg total) by mouth at bedtime. 01/15/22   Agapito GamesMetheney, Catherine D, MD  carvedilol (COREG) 6.25 MG tablet TAKE 1 TABLET (6.25 MG TOTAL) BY MOUTH 2 (TWO) TIMES DAILY WITH A MEAL. NO REFILLS. NEEDS AN APPT. 11/12/22   Agapito GamesMetheney, Catherine D, MD  [facility] ASPIRIN 81 MG chewable tablet Chew 1 tablet by mouth. 02/28/16   [provider]  methocarbamol (ROBAXIN) 500 MG tablet  Take 1 tablet (500 mg total) by mouth 3 (three) times daily as needed. 10/31/22   Trevor Iha, FNP  pantoprazole (PROTONIX) 40 MG tablet TAKE 1 TABLET BY MOUTH EVERY DAY 12/12/21   Agapito Games, MD    Family History Family History  Problem Relation Age of Onset   Heart disease Mother    Diabetes Father    Cancer Father        prostrate   Cancer Sister        esophagus   Diabetes Sister    Cancer Brother        kidney   Obesity Neg Hx     Social History Social History   Tobacco Use   Smoking status: Former    Packs/day: 0.50    Years: 20.00    Additional pack years:  0.00    Total pack years: 10.00    Types: Cigarettes    Quit date: 08/11/2015    Years since quitting: 7.2   Smokeless tobacco: Never  Vaping Use   Vaping Use: Former  Substance Use Topics   Alcohol use: Not Currently    Comment: occasionally   Drug use: No     Allergies   Nsaids and Tramadol   Review of Systems Review of Systems  Musculoskeletal:  Positive for back pain.     Physical Exam Triage Vital Signs ED Triage Vitals  Enc Vitals Group     BP 11/18/22 1131 (!) 178/108     Pulse Rate 11/18/22 1131 67     Resp 11/18/22 1131 17     Temp 11/18/22 1131 97.6 F (36.4 C)     Temp Source 11/18/22 1131 Oral     SpO2 11/18/22 1131 98 %     Weight --      Height --      Head Circumference --      Peak Flow --      Pain Score 11/18/22 1130 9     Pain Loc --      Pain Edu? --      Excl. in GC? --    No data found.  Updated Vital Signs BP (!) 178/108 (BP Location: Right Arm)   Pulse 67   Temp 97.6 F (36.4 C) (Oral)   Resp 17   SpO2 98%   Physical Exam Vitals and nursing note reviewed.  Constitutional:      Appearance: Normal appearance. She is normal weight.  HENT:     Head: Normocephalic and atraumatic.     Mouth/Throat:     Mouth: Mucous membranes are moist.     Pharynx: Oropharynx is clear.  Eyes:     Extraocular Movements: Extraocular movements intact.     Conjunctiva/sclera: Conjunctivae normal.     Pupils: Pupils are equal, round, and reactive to light.  Cardiovascular:     Rate and Rhythm: Normal rate and regular rhythm.     Pulses: Normal pulses.     Heart sounds: Normal heart sounds. No murmur heard.    Comments: Hypertensive Pulmonary:     Effort: Pulmonary effort is normal.     Breath sounds: Normal breath sounds. No wheezing or rhonchi.  Musculoskeletal:        General: Normal range of motion.     Cervical back: Normal range of motion and neck supple.     Comments: Lumbar sacral spine (left sided-inferior aspect): TTP over paraspinous  muscles, no deformity noted  Skin:    General: Skin is  warm and dry.  Neurological:     General: No focal deficit present.     Mental Status: She is alert and oriented to person, place, and time. Mental status is at baseline.      UC Treatments / Results  Labs (all labs ordered are listed, but only abnormal results are displayed) Labs Reviewed - No data to display  EKG   Radiology DG Lumbar Spine Complete  Result Date: 11/18/2022 CLINICAL DATA:  Left low back pain. EXAM: LUMBAR SPINE - COMPLETE 4+ VIEW COMPARISON:  No comparison studies available. FINDINGS: No evidence for an acute fracture. Marked loss of disc height with endplate degeneration noted L2-3. Trace anterolisthesis noted L4 on 5. Mild bilateral facet degeneration noted lower lumbar levels. IMPRESSION: Degenerative changes without acute bony abnormality. Electronically Signed   By: Kennith Center M.D.   On: 11/18/2022 12:12    Procedures Procedures (including critical care time)  Medications Ordered in UC Medications - No data to display  Initial Impression / Assessment and Plan / UC Course  I have reviewed the triage vital signs and the nursing notes.  Pertinent labs & imaging results that were available during my care of the patient were reviewed by me and considered in my medical decision making (see chart for details).     MDM: 1.  Sciatica of left side-Rx'd Celebrex 100 mg twice daily x 15 days; 2.  Lumbar spondylosis-x-ray of lumbar spine revealed above Rx'd Norco 5/325 mg every 8 hours, as needed for left-sided lower back pain. Advised of lumbar spine x-ray results with hardcopy and images provided to patient.  Advised patient to take medication as directed with food to completion.  Advised patient may take Norco for breakthrough low back pain daily, as needed/if needed.  Encouraged to increase daily water intake to 64 ounces per day while taking these medications.  Advised if symptoms worsen and/or unresolved  please follow-up with [facility] orthopedic provider, PCP or here for further evaluation.  Patient discharged home, hemodynamically stable. Final Clinical Impressions(s) / UC Diagnoses   Final diagnoses:  Sciatica of left side  Lumbar spondylosis     Discharge Instructions      Advised of lumbar spine x-ray results with hardcopy and images provided to patient.  Advised patient to take medication as directed with food to completion.  Advised patient may take Norco for breakthrough low back pain daily, as needed/if needed.  Encouraged to increase daily water intake to 64 ounces per day while taking these medications.  Advised if symptoms worsen and/or unresolved please follow-up with [facility] orthopedic provider, PCP or here for further evaluation.     ED Prescriptions     Medication Sig Dispense Auth. Provider   celecoxib (CELEBREX) 100 MG capsule Take 1 capsule (100 mg total) by mouth 2 (two) times daily for 15 days. 30 capsule Trevor Iha, FNP   HYDROcodone-acetaminophen (NORCO/VICODIN) 5-325 MG tablet Take 1 tablet by mouth every 8 (eight) hours as needed. 15 tablet Trevor Iha, FNP      I have reviewed the PDMP during this encounter.   Trevor Iha, FNP 11/18/22 1247

## 2022-11-18 NOTE — ED Triage Notes (Addendum)
Pt c/o LT lower back pain since midnight this morning. Pain radiates down left buttock. Was seen here 3/23 , given steroid shot. Also using muscle relaxer at night. Pain 9/10

## 2022-12-01 ENCOUNTER — Ambulatory Visit (INDEPENDENT_AMBULATORY_CARE_PROVIDER_SITE_OTHER): Payer: Medicare HMO | Admitting: Sports Medicine

## 2022-12-01 ENCOUNTER — Ambulatory Visit (INDEPENDENT_AMBULATORY_CARE_PROVIDER_SITE_OTHER): Payer: Medicare HMO

## 2022-12-01 DIAGNOSIS — M48061 Spinal stenosis, lumbar region without neurogenic claudication: Secondary | ICD-10-CM | POA: Diagnosis not present

## 2022-12-01 DIAGNOSIS — M5416 Radiculopathy, lumbar region: Secondary | ICD-10-CM

## 2022-12-01 MED ORDER — TRAMADOL HCL 50 MG PO TABS
50.0000 mg | ORAL_TABLET | Freq: Three times a day (TID) | ORAL | 0 refills | Status: DC | PRN
Start: 2022-12-01 — End: 2023-03-03

## 2022-12-01 NOTE — Addendum Note (Signed)
Addended by: Monica Becton on: 12/01/2022 05:11 PM   Modules accepted: Orders

## 2022-12-01 NOTE — Assessment & Plan Note (Addendum)
This is a very pleasant 72 year old female, she has had over 6 weeks of severe axial low back pain with radiation down the left leg, worse with sitting, flexion Valsalva, she has had a couple of emergency visits, she does have some hydrocodone, she got a Solu-Medrol shot. Unfortunate continues to have severe discomfort. She never took the hydrocodone as she was afraid of getting poked, I am happy to downgrade her to tramadol but she understands she does have the hydrocodone to use if insufficiently effective. Due to severity of pain we will proceed with MRI for epidural planning. Return to see me 6 weeks after epidural injection. Adding additional home physical therapy.  For insurance coverage purposes he has failed greater than 6 weeks of home physical therapy as well as medications. For insurance coverage purposes she has progressive weakness left lower extremity.  Update: I personally reviewed the MRI, multiple pathologic findings, dominant finding with severe facet arthritis and spinal stenosis at L4-L5, proceeding with left L4-L5 interlaminar epidural.

## 2022-12-01 NOTE — Progress Notes (Addendum)
    Procedures performed today:    None.  Independent interpretation of notes and tests performed by another provider:   Lumbar spine x-rays personally viewed, multilevel DDD  Brief History, Exam, Impression, and Recommendations:    Left lumbar radiculopathy This is a very pleasant 72 year old female, she has had over 6 weeks of severe axial low back pain with radiation down the left leg, worse with sitting, flexion Valsalva, she has had a couple of emergency visits, she does have some hydrocodone, she got a Solu-Medrol shot. Unfortunate continues to have severe discomfort. She never took the hydrocodone as she was afraid of getting poked, I am happy to downgrade her to tramadol but she understands she does have the hydrocodone to use if insufficiently effective. Due to severity of pain we will proceed with MRI for epidural planning. Return to see me 6 weeks after epidural injection. Adding additional home physical therapy.  For insurance coverage purposes he has failed greater than 6 weeks of home physical therapy as well as medications. For insurance coverage purposes she has progressive weakness left lower extremity.  Update: I personally reviewed the MRI, multiple pathologic findings, dominant finding with severe facet arthritis and spinal stenosis at L4-L5, proceeding with left L4-L5 interlaminar epidural.    ____________________________________________ Ihor Austin. Benjamin Stain, M.D., ABFM., CAQSM., AME. Primary Care and Sports Medicine [facility]  Adjunct Professor of Family Medicine  [employer]  Restaurant manager, fast food

## 2022-12-03 ENCOUNTER — Encounter: Payer: Self-pay | Admitting: Sports Medicine

## 2022-12-05 ENCOUNTER — Other Ambulatory Visit: Payer: Self-pay | Admitting: Family Medicine

## 2022-12-14 ENCOUNTER — Ambulatory Visit
Admission: RE | Admit: 2022-12-14 | Discharge: 2022-12-14 | Disposition: A | Discharge status: Home / Self Care | Payer: Medicare HMO | Source: Ambulatory Visit | Attending: Sports Medicine | Admitting: Sports Medicine

## 2022-12-14 DIAGNOSIS — M5416 Radiculopathy, lumbar region: Secondary | ICD-10-CM | POA: Diagnosis not present

## 2022-12-14 DIAGNOSIS — M47817 Spondylosis without myelopathy or radiculopathy, lumbosacral region: Secondary | ICD-10-CM | POA: Diagnosis not present

## 2022-12-14 MED ORDER — IOPAMIDOL (ISOVUE-M 200) INJECTION 41%
1.0000 mL | Freq: Once | INTRAMUSCULAR | Status: AC
  Administered 2022-12-14: 1 mL via EPIDURAL

## 2022-12-14 MED ORDER — METHYLPREDNISOLONE ACETATE 40 MG/ML INJ SUSP (RADIOLOG
80.0000 mg | Freq: Once | INTRAMUSCULAR | Status: AC
  Administered 2022-12-14: 80 mg via EPIDURAL

## 2022-12-14 NOTE — Discharge Instructions (Signed)
Post Procedure Spinal Discharge Instruction Sheet  You may resume a regular diet and any medications that you routinely take (including pain medications).  No driving day of procedure.  Light activity throughout the rest of the day.  Do not do any strenuous work, exercise, bending or lifting.  The day following the procedure, you can resume normal physical activity but you should refrain from exercising or physical therapy for at least three days thereafter.   Common Side Effects:  Headaches- take your usual medications as directed by your physician.  Increase your fluid intake.  Caffeinated beverages may be helpful.  Lie flat in bed until your headache resolves.  Restlessness or inability to sleep- you may have trouble sleeping for the next few days.  Ask your referring physician if you need any medication for sleep.  Facial flushing or redness- should subside within a few days.  Increased pain- a temporary increase in pain a day or two following your procedure is not unusual.  Take your pain medication as prescribed by your referring physician.  Leg cramps  Please contact our office at [phone] for the following symptoms: Fever greater than 100 degrees. Headaches unresolved with medication after 2-3 days. Increased swelling, pain, or redness at injection site.  You may restart your Aspirin today.

## 2022-12-16 ENCOUNTER — Other Ambulatory Visit: Payer: Self-pay | Admitting: Family Medicine

## 2022-12-16 MED ORDER — ALBUTEROL SULFATE HFA 108 (90 BASE) MCG/ACT IN AERS: INHALATION_SPRAY | RESPIRATORY_TRACT | 99 refills | Status: DC

## 2023-01-16 IMAGING — DX DG HAND 2V*R*
2 series · 2 of 2 positions shown · non-contrast
Comparison: None Available.

CLINICAL DATA: Second and third DIP pain and swelling.

EXAM:
RIGHT HAND - 2 VIEW

[hand pa]
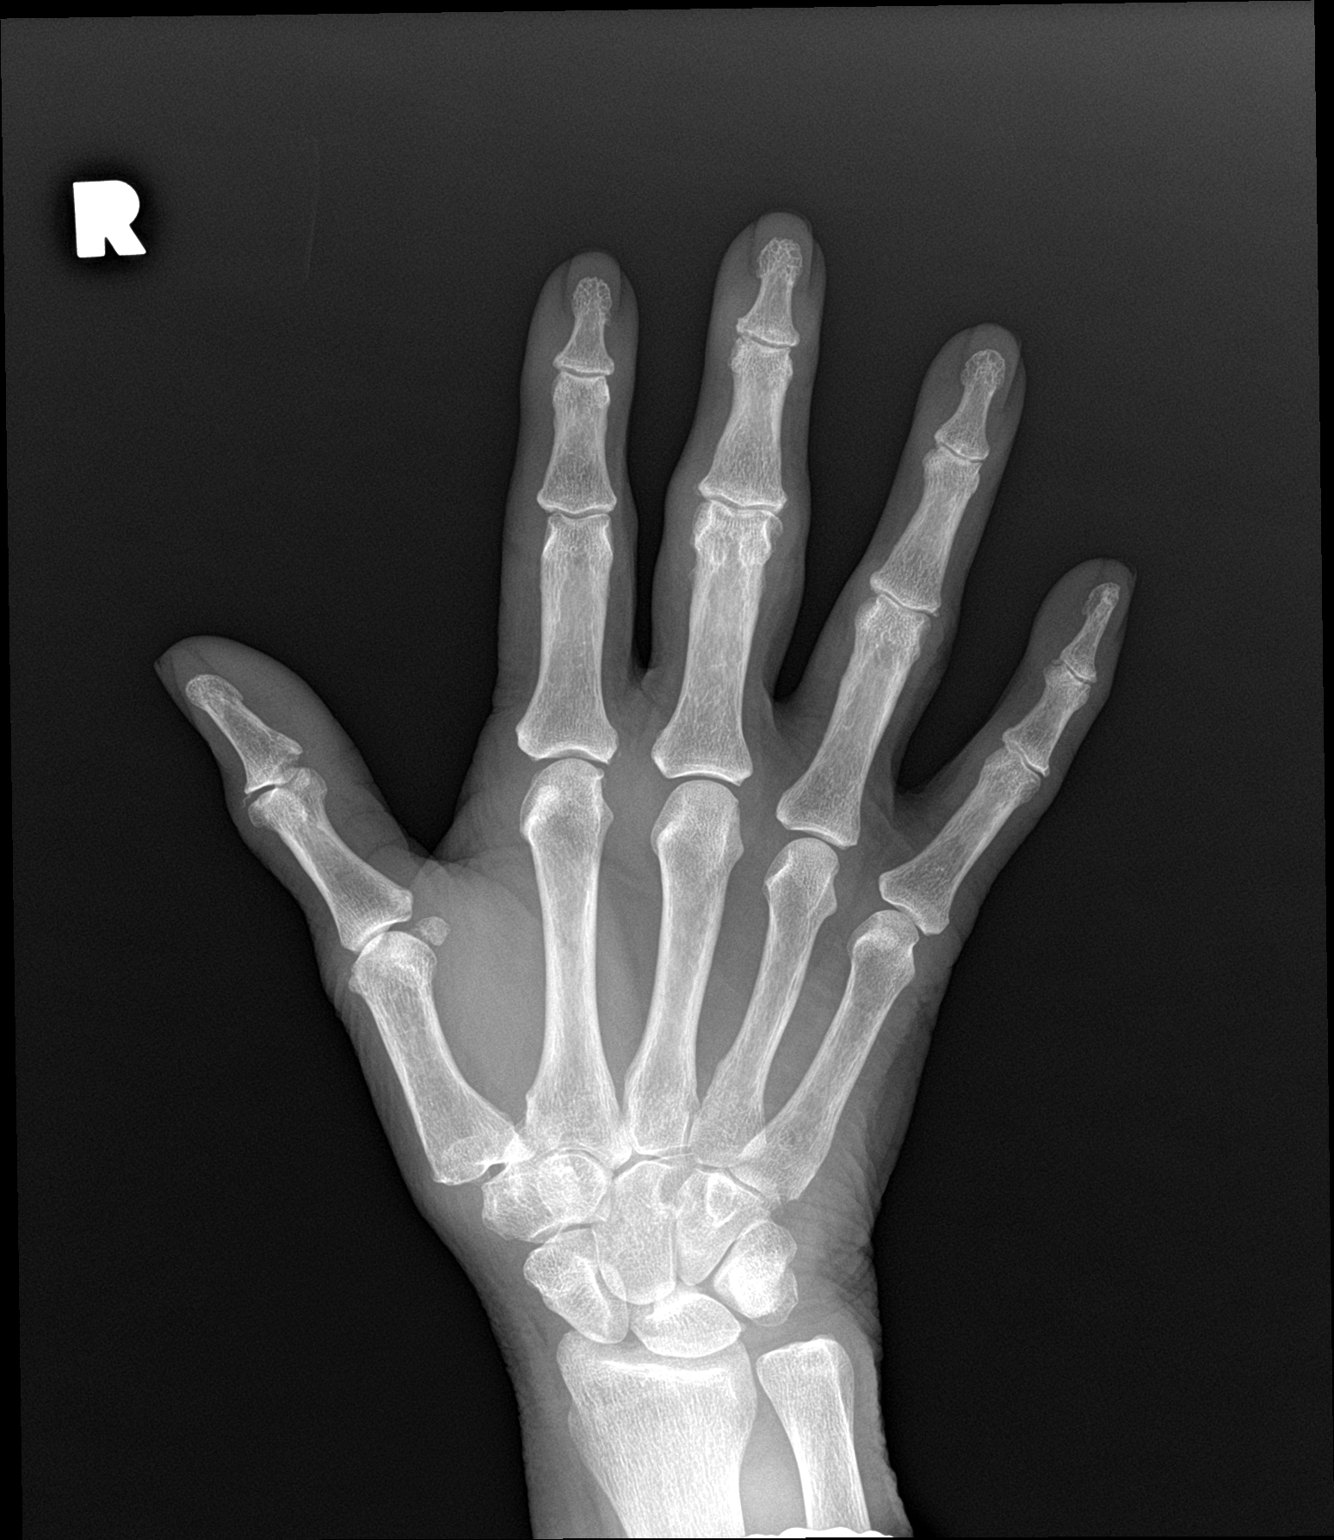

[hand lat]
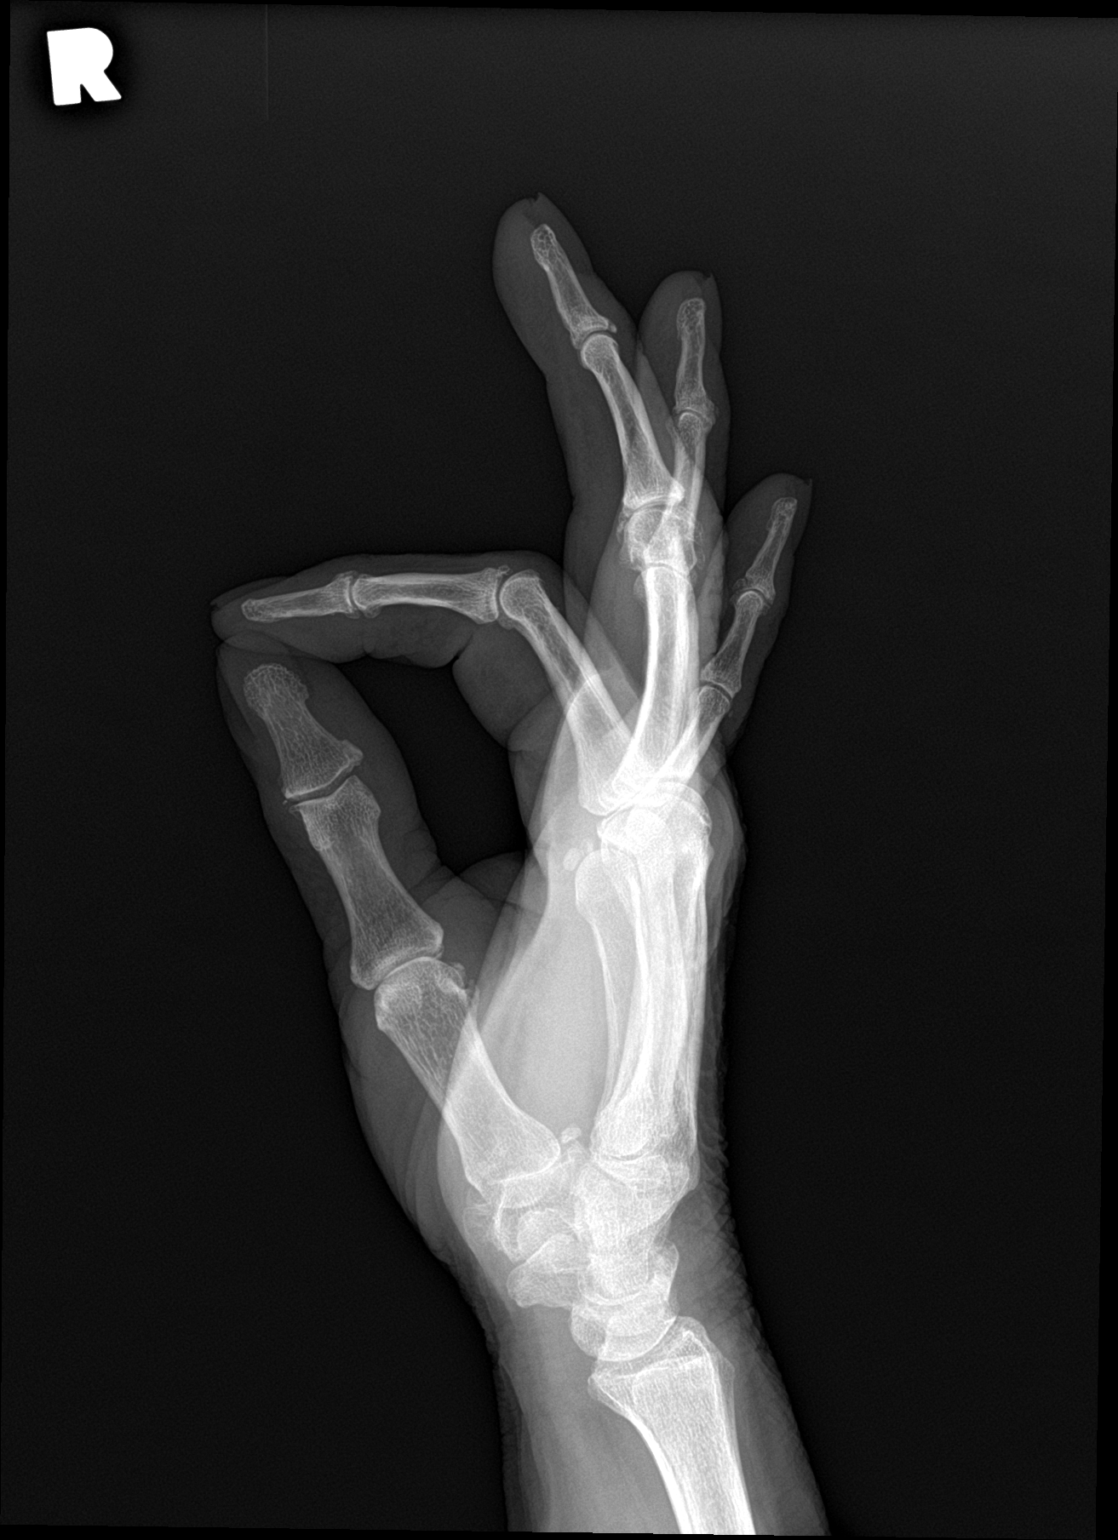

[2 of 2 positions shown; findings below may reference images not displayed]

FINDINGS: Mildly decreased bone mineralization. Mild joint space narrowing of
the second through fifth DIP joints. Mild peripheral degenerative
osteophytosis of the thumb interphalangeal joint and the second
through fifth DIP joints. Additional similar mild osteoarthritis of
the third greater than second, fourth, and fifth finger PIP joints.

Mild thumb carpometacarpal joint space narrowing and peripheral
osteophytosis with small approximate 3 mm chronic degenerative
ossicle at the medial aspect of the thumb carpometacarpal joint seen
on lateral view.

No acute fracture or dislocation. No cortical erosion or
periostitis.
IMPRESSION: Mild-to-moderate osteoarthritis of the interphalangeal joints
diffusely, greatest within the DIP joints and the third finger PIP
joint.

## 2023-01-16 IMAGING — DX DG HAND 2V*L*
2 series · 2 of 2 positions shown · non-contrast
Comparison: None Available.

CLINICAL DATA: Pain and swelling of the second and third DIP
joints.

EXAM:
LEFT HAND - 2 VIEW

[hand pa]
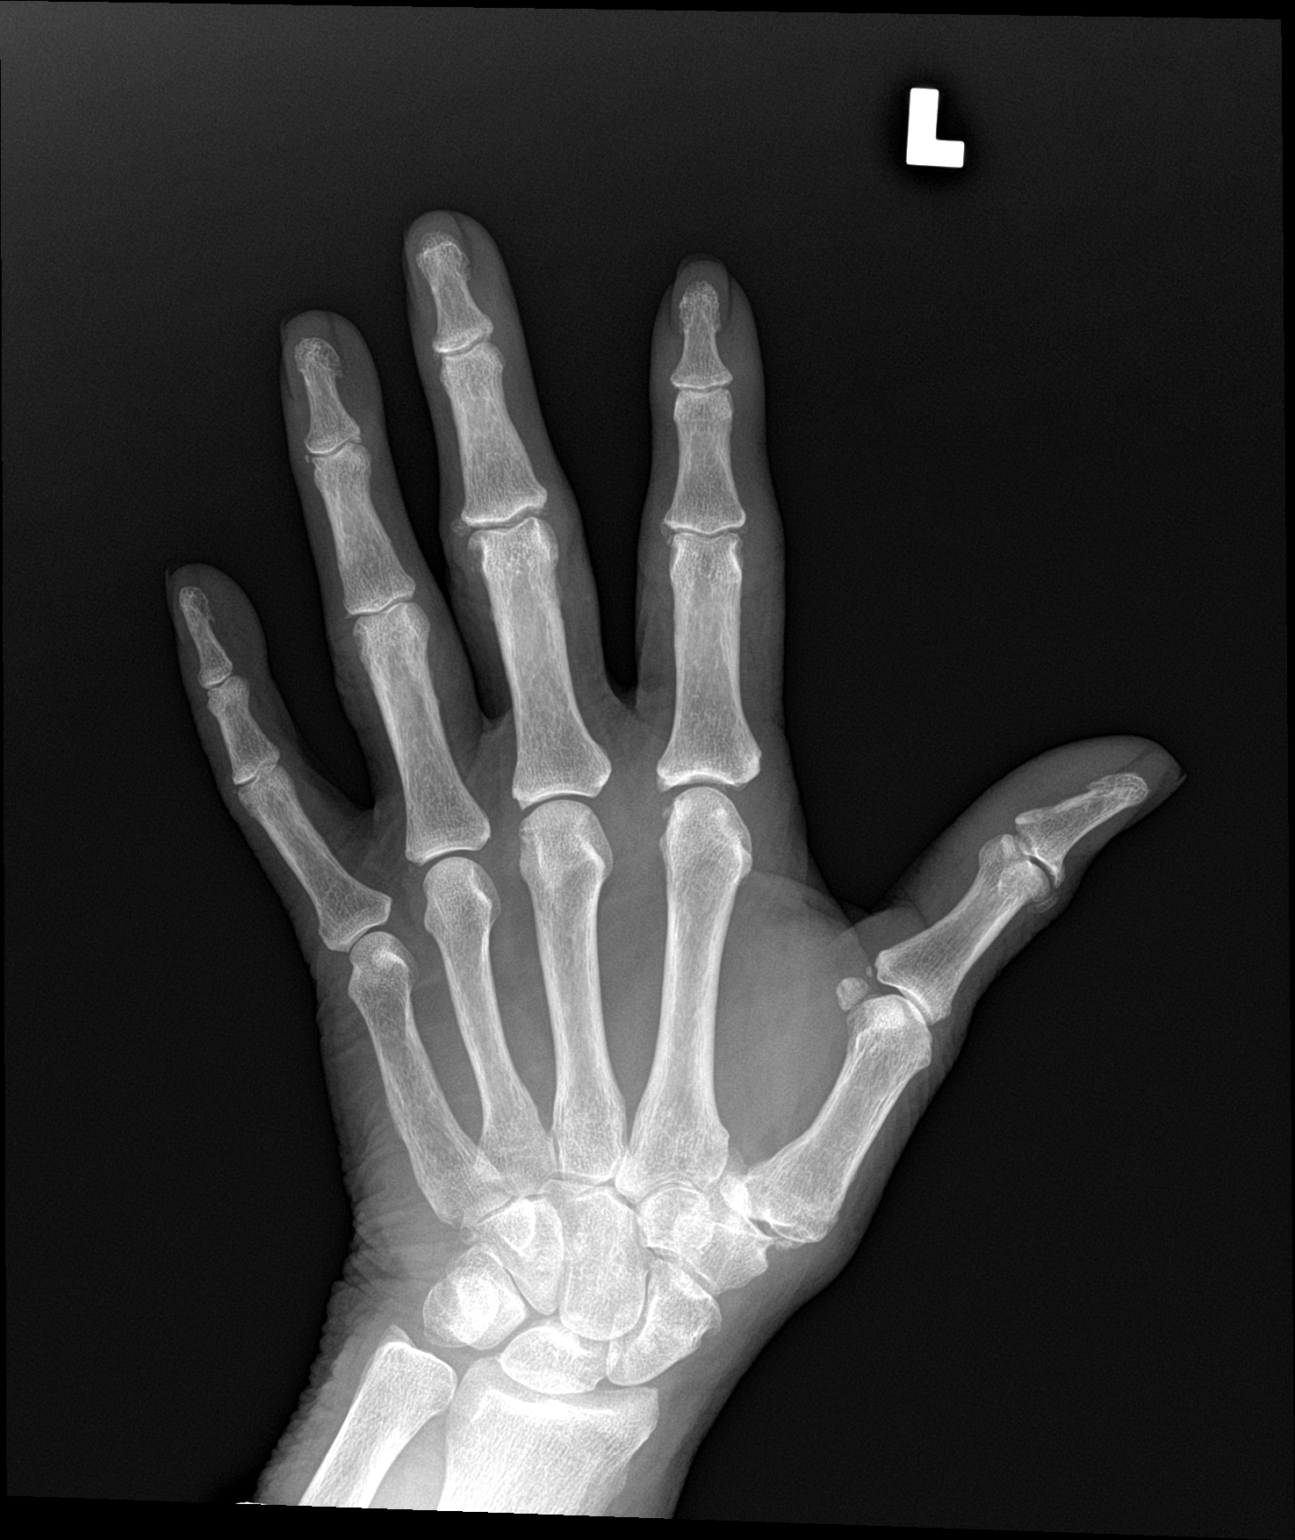

[hand lat]
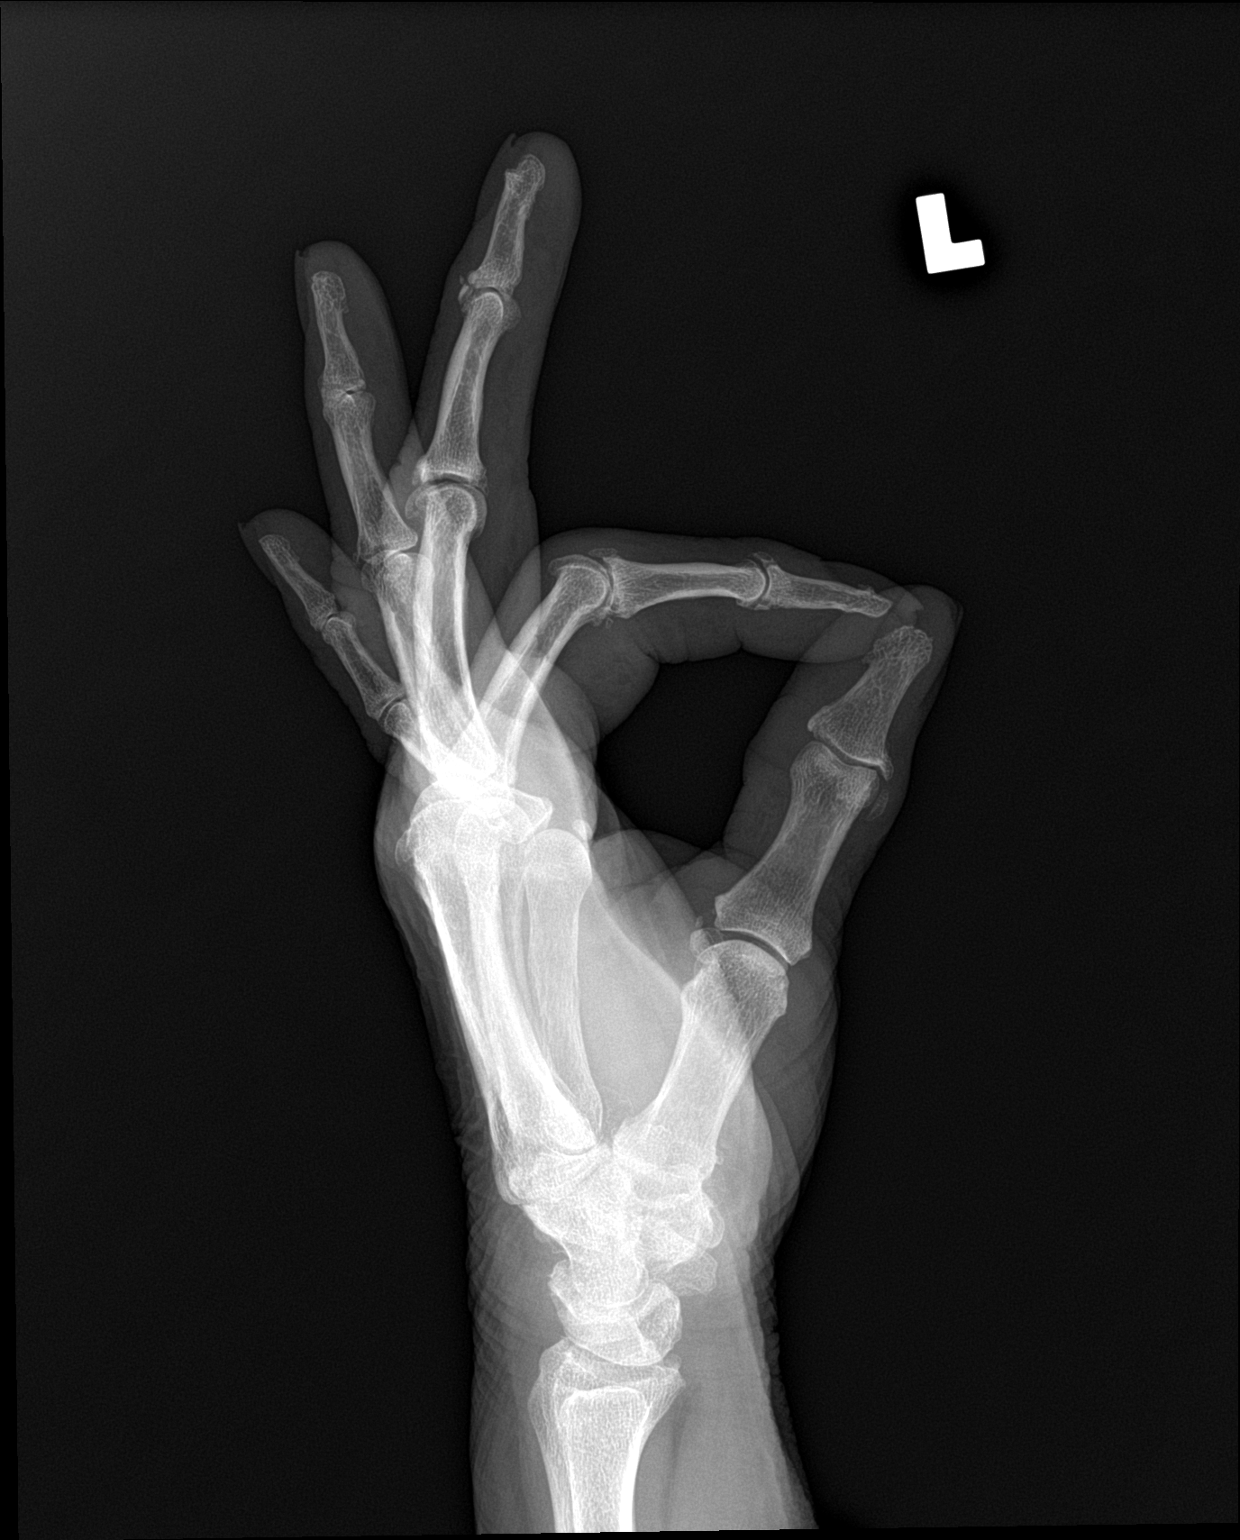

[2 of 2 positions shown; findings below may reference images not displayed]

FINDINGS: Mildly decreased bone mineralization. Mild joint space narrowing of
the thumb interphalangeal joint and the second through fifth finger
interphalangeal joints diffusely. Mild to moderate peripheral
degenerative osteophytes are greatest at the thumb interphalangeal
joint and second and third PIP and DIP joints. Tiny 3 mm chronic
ossicle at the medial aspect of third finger PIP joint. Mild soft
tissue swelling at the second and third PIP joints.

Moderate thumb carpometacarpal joint space narrowing with mild
peripheral degenerative osteophytes. Mild triscaphe joint space
narrowing and peripheral osteophytosis.

No cortical erosion or periostitis. No acute fracture or
dislocation.
IMPRESSION: Mild-to-moderate osteoarthritis, greatest within the interphalangeal
joints, thumb carpometacarpal joint, and triscaphe joint.

## 2023-02-22 ENCOUNTER — Other Ambulatory Visit: Payer: Self-pay | Admitting: Family Medicine

## 2023-02-22 DIAGNOSIS — I2510 Atherosclerotic heart disease of native coronary artery without angina pectoris: Secondary | ICD-10-CM

## 2023-02-24 ENCOUNTER — Encounter (INDEPENDENT_AMBULATORY_CARE_PROVIDER_SITE_OTHER): Payer: Medicare HMO | Admitting: Sports Medicine

## 2023-02-24 DIAGNOSIS — M5416 Radiculopathy, lumbar region: Secondary | ICD-10-CM | POA: Diagnosis not present

## 2023-02-24 NOTE — Telephone Encounter (Signed)
I spent 5 total minutes of online digital evaluation and management services in this patient-initiated request for online care. 

## 2023-03-03 ENCOUNTER — Ambulatory Visit (INDEPENDENT_AMBULATORY_CARE_PROVIDER_SITE_OTHER): Payer: Medicare HMO | Admitting: Family Medicine

## 2023-03-03 DIAGNOSIS — Z780 Asymptomatic menopausal state: Secondary | ICD-10-CM

## 2023-03-03 DIAGNOSIS — Z0000 Encounter for general adult medical examination without abnormal findings: Secondary | ICD-10-CM

## 2023-03-03 DIAGNOSIS — Z1231 Encounter for screening mammogram for malignant neoplasm of breast: Secondary | ICD-10-CM

## 2023-03-03 NOTE — Patient Instructions (Signed)
MEDICARE ANNUAL WELLNESS VISIT Health Maintenance Summary and Written Plan of Care  Rachel Carr ,  Thank you for allowing me to perform your Medicare Annual Wellness Visit and for your ongoing commitment to your health.   Health Maintenance & Immunization History Health Maintenance  Topic Date Due   COVID-19 Vaccine (1 - 2023-24 season) 07/26/2023 (Originally 04/10/2022)   Zoster Vaccines- Shingrix (1 of 2) 08/08/2023 (Originally 11/21/2000)   DEXA SCAN  03/02/2024 (Originally 11/04/2022)   INFLUENZA VACCINE  03/11/2023   Medicare Annual Wellness (AWV)  03/02/2024   MAMMOGRAM  05/13/2024   Colonoscopy  06/07/2025   DTaP/Tdap/Td (2 - Td or Tdap) 11/01/2029   Pneumonia Vaccine 13+ Years old  Completed   Hepatitis C Screening  Completed   HPV VACCINES  Aged Out   Immunization History  Administered Date(s) Administered   Fluad Quad(high Dose 65+) 06/12/2020, 07/01/2021   Influenza, High Dose Seasonal PF 05/03/2019   PNEUMOCOCCAL CONJUGATE-20 12/12/2020   Tdap 11/02/2019    These are the patient goals that we discussed:  Goals Addressed               This Visit's Progress     Patient Stated (pt-stated)        Patient stated that she would like to see if her pain can improve.          This is a list of Health Maintenance Items that are overdue or due now: Screening mammography Bone densitometry screening Lung cancer screening    Orders/Referrals Placed Today: Orders Placed This Encounter  Procedures   DEXAScan    Standing Status:   Future    Standing Expiration Date:   03/02/2024    Scheduling Instructions:     Please call patient to schedule. She would like to mammogram, bone density and lung cancer screening the same day.    Order Specific Question:   Reason for exam:    Answer:   post menopausal    Order Specific Question:   Preferred imaging location?    [facility]   Mammogram 3D SCREEN BREAST BILATERAL    Standing Status:   Future     Standing Expiration Date:   03/02/2024    Scheduling Instructions:     Please call patient to schedule.    Order Specific Question:   Reason for Exam (SYMPTOM  OR DIAGNOSIS REQUIRED)    Answer:   breast cancer screening    Order Specific Question:   Preferred imaging location?    Answer:   [facility]    (Contact our referral department at [phone] if you have not spoken with someone about your referral appointment within the next 5 days)    Follow-up Plan Follow-up with Agapito Games, MD as planned Medicare wellness visit in one year.  AVS printed and mailed to the patient.       Health Maintenance, Female Adopting a healthy lifestyle and getting preventive care are important in promoting health and wellness. Ask your health care provider about: The right schedule for you to have regular tests and exams. Things you can do on your own to prevent diseases and keep yourself healthy. What should I know about diet, weight, and exercise? Eat a healthy diet  Eat a diet that includes plenty of vegetables, fruits, low-fat dairy products, and lean protein. Do not eat a lot of foods that are high in solid fats, added sugars, or sodium. Maintain a healthy weight Body mass index (BMI)  is used to identify weight problems. It estimates body fat based on height and weight. Your health care provider can help determine your BMI and help you achieve or maintain a healthy weight. Get regular exercise Get regular exercise. This is one of the most important things you can do for your health. Most adults should: Exercise for at least 150 minutes each week. The exercise should increase your heart rate and make you sweat (moderate-intensity exercise). Do strengthening exercises at least twice a week. This is in addition to the moderate-intensity exercise. Spend less time sitting. Even light physical activity can be beneficial. Watch cholesterol and blood lipids Have your blood  tested for lipids and cholesterol at 71 years of age, then have this test every 5 years. Have your cholesterol levels checked more often if: Your lipid or cholesterol levels are high. You are older than 73 years of age. You are at high risk for heart disease. What should I know about cancer screening? Depending on your health history and family history, you may need to have cancer screening at various ages. This may include screening for: Breast cancer. Cervical cancer. Colorectal cancer. Skin cancer. Lung cancer. What should I know about heart disease, diabetes, and high blood pressure? Blood pressure and heart disease High blood pressure causes heart disease and increases the risk of stroke. This is more likely to develop in people who have high blood pressure readings or are overweight. Have your blood pressure checked: Every 3-5 years if you are 27-52 years of age. Every year if you are 76 years old or older. Diabetes Have regular diabetes screenings. This checks your fasting blood sugar level. Have the screening done: Once every three years after age 49 if you are at a normal weight and have a low risk for diabetes. More often and at a younger age if you are overweight or have a high risk for diabetes. What should I know about preventing infection? Hepatitis B If you have a higher risk for hepatitis B, you should be screened for this virus. Talk with your health care provider to find out if you are at risk for hepatitis B infection. Hepatitis C Testing is recommended for: Everyone born from [birth year] through 1965. Anyone with known risk factors for hepatitis C. Sexually transmitted infections (STIs) Get screened for STIs, including gonorrhea and chlamydia, if: You are sexually active and are younger than 72 years of age. You are older than 72 years of age and your health care provider tells you that you are at risk for this type of infection. Your sexual activity has changed since  you were last screened, and you are at increased risk for chlamydia or gonorrhea. Ask your health care provider if you are at risk. Ask your health care provider about whether you are at high risk for HIV. Your health care provider may recommend a prescription medicine to help prevent HIV infection. If you choose to take medicine to prevent HIV, you should first get tested for HIV. You should then be tested every 3 months for as long as you are taking the medicine. Pregnancy If you are about to stop having your period (premenopausal) and you may become pregnant, seek counseling before you get pregnant. Take 400 to 800 micrograms (mcg) of folic acid every day if you become pregnant. Ask for birth control (contraception) if you want to prevent pregnancy. Osteoporosis and menopause Osteoporosis is a disease in which the bones lose minerals and strength with aging. This can result  in bone fractures. If you are 63 years old or older, or if you are at risk for osteoporosis and fractures, ask your health care provider if you should: Be screened for bone loss. Take a calcium or vitamin D supplement to lower your risk of fractures. Be given hormone replacement therapy (HRT) to treat symptoms of menopause. Follow these instructions at home: Alcohol use Do not drink alcohol if: Your health care provider tells you not to drink. You are pregnant, may be pregnant, or are planning to become pregnant. If you drink alcohol: Limit how much you have to: 0-1 drink a day. Know how much alcohol is in your drink. In the [place] one drink equals one 12 oz bottle of beer (355 mL), one 5 oz glass of wine (148 mL), or one 1 oz glass of hard liquor (44 mL). Lifestyle Do not use any products that contain nicotine or tobacco. These products include cigarettes, chewing tobacco, and vaping devices, such as e-cigarettes. If you need help quitting, ask your health care provider. Do not use street drugs. Do not share  needles. Ask your health care provider for help if you need support or information about quitting drugs. General instructions Schedule regular health, dental, and eye exams. Stay current with your vaccines. Tell your health care provider if: You often feel depressed. You have ever been abused or do not feel safe at home. Summary Adopting a healthy lifestyle and getting preventive care are important in promoting health and wellness. Follow your health care provider's instructions about healthy diet, exercising, and getting tested or screened for diseases. Follow your health care provider's instructions on monitoring your cholesterol and blood pressure. This information is not intended to replace advice given to you by your health care provider. Make sure you discuss any questions you have with your health care provider. Document Revised: 12/16/2020 Document Reviewed: 12/16/2020 Elsevier Patient Education  2024 [employer].

## 2023-03-03 NOTE — Progress Notes (Signed)
MEDICARE ANNUAL WELLNESS VISIT  03/03/2023  Telephone Visit Disclaimer This Medicare AWV was conducted by telephone due to national recommendations for restrictions regarding the COVID-19 Pandemic (e.g. social distancing).  I verified, using two identifiers, that I am speaking with Rachel Carr or their authorized healthcare agent. I discussed the limitations, risks, security, and privacy concerns of performing an evaluation and management service by telephone and the potential availability of an in-person appointment in the future. The patient expressed understanding and agreed to proceed.  Location of Patient: Home Location of Provider (nurse):  In the office.  Subjective:    Rachel Carr is a 72 y.o. female patient of Metheney, Barbarann Ehlers, MD who had a Medicare Annual Wellness Visit today via telephone. Rachel Carr is Retired and lives with their spouse. she has 1 child. she reports that she is socially active and does interact with friends/family regularly. she is moderately physically active and enjoys gardening.  Patient Care Team: Agapito Games, MD as PCP - General (Family Medicine) Jens Som Madolyn Frieze, MD as PCP - Cardiology (Cardiology) Konrad Penta, MD as Referring Physician (Gastroenterology)     03/03/2023    1:47 PM 02/16/2022    2:29 PM 02/12/2021    9:38 AM 12/02/2016    2:14 PM 08/30/2014    1:47 PM  Advanced Directives  Does Patient Have a Medical Advance Directive? No No No No No  Would patient like information on creating a medical advance directive? No - Patient declined No - Patient declined No - Patient declined No - Patient declined Yes - Educational materials given    Hospital Utilization Over the Past 12 Months: # of hospitalizations or ER visits: 0 # of surgeries: 0  Review of Systems    Patient reports that her overall health is unchanged compared to last year.  History obtained from chart review and the patient  Patient Reported  Readings (BP, Pulse, CBG, Weight, etc) Per patient no change in vitals since last visit, unable to obtain new vitals due to telehealth visit and lack of equipment.   Pain Assessment Pain : 0-10 Pain Score: 7  Pain Type: Chronic pain Pain Location: Other (Comment) (left sciatica pain) Pain Orientation: Left Pain Descriptors / Indicators: Aching, Constant Pain Onset: More than a month ago Pain Frequency: Constant Pain Relieving Factors: waiting to get epidural; rest gives some relief  Pain Relieving Factors: waiting to get epidural; rest gives some relief  Current Medications & Allergies (verified) Allergies as of 03/03/2023       Reactions   Nsaids Other (See Comments)   History of Niesen fundoplication.   Tramadol Nausea Only        Medication List        Accurate as of March 03, 2023  2:00 PM. If you have any questions, ask your nurse or doctor.          STOP taking these medications    HYDROcodone-acetaminophen 5-325 MG tablet Commonly known as: NORCO/VICODIN   methocarbamol 500 MG tablet Commonly known as: ROBAXIN   traMADol 50 MG tablet Commonly known as: ULTRAM       TAKE these medications    albuterol 108 (90 Base) MCG/ACT inhaler Commonly known as: VENTOLIN HFA INHALE 2 PUFFS INTO THE LUNGS EVERY 4 (FOUR) HOURS AS NEEDED FOR WHEEZING   atorvastatin 80 MG tablet Commonly known as: LIPITOR TAKE 1 TABLET BY MOUTH EVERYDAY AT BEDTIME   carvedilol 6.25 MG tablet Commonly known as: COREG Take  1 tablet (6.25 mg total) by mouth 2 (two) times daily with a meal. NO REFILLS. NEEDS AN APPT.   [facility] Children[facility] Aspirin 81 MG chewable tablet Generic drug: aspirin Chew 1 tablet by mouth.   pantoprazole 40 MG tablet Commonly known as: PROTONIX TAKE 1 TABLET BY MOUTH EVERY DAY        History (reviewed): Past Medical History:  Diagnosis Date   Allergy    seasonal   Arthritis    Barrett's syndrome    CAD (coronary artery disease)    Emphysema of lung  (HCC)    Esophageal stricture    Esophageal ulcer 09/2010   gastritis Dr. Jason Fila   Gastric polyps    colonoscopy 2012(due again for repeat 2017)   GERD (gastroesophageal reflux disease)    Shingles    on the right eye   Past Surgical History:  Procedure Laterality Date   CARPAL TUNNEL RELEASE     CHOLECYSTECTOMY     COMPLETE MASTECTOMY W/ SENTINEL NODE BIOPSY     JOINT REPLACEMENT     NISSEN FUNDOPLICATION  2012   RADIOFREQUENCY ABLATION     RT knee surgery     TOTAL ABDOMINAL HYSTERECTOMY W/ BILATERAL SALPINGOOPHORECTOMY     endometriosis   Family History  Problem Relation Age of Onset   Heart disease Mother    Diabetes Father    Cancer Father        prostrate   Cancer Sister        esophagus   Diabetes Sister    Cancer Brother        kidney   Obesity Neg Hx    Social History   Socioeconomic History   Marital status: Married    Spouse name: Ramon Dredge   Number of children: 1   Years of education: 12   Highest education level: 11th grade  Occupational History    Comment: Retired.  Tobacco Use   Smoking status: Former    Current packs/day: 0.00    Average packs/day: 0.5 packs/day for 20.0 years (10.0 ttl pk-yrs)    Types: Cigarettes    Start date: 08/11/1995    Quit date: 08/11/2015    Years since quitting: 7.5   Smokeless tobacco: Never  Vaping Use   Vaping status: Former  Substance and Sexual Activity   Alcohol use: Not Currently    Comment: occasionally   Drug use: No   Sexual activity: Yes    Birth control/protection: None  Other Topics Concern   Not on file  Social History Narrative   Lives with her husband. She has one child. She enjoys gardening and working outside.   Social Determinants of Health   Financial Resource Strain: Low Risk  (03/01/2023)   Overall Financial Resource Strain (CARDIA)    Difficulty of Paying Living Expenses: Not very hard  Food Insecurity: Food Insecurity Present (03/01/2023)   Hunger Vital Sign    Worried About Running Out  of Food in the Last Year: Sometimes true    Ran Out of Food in the Last Year: Never true  Transportation Needs: No Transportation Needs (03/01/2023)   PRAPARE - Administrator, Civil Service (Medical): No    Lack of Transportation (Non-Medical): No  Physical Activity: Patient Declined (03/01/2023)   Exercise Vital Sign    Days of Exercise per Week: Patient declined    Minutes of Exercise per Session: Patient declined  Stress: No Stress Concern Present (03/01/2023)   [employer] of Occupational Health -  Occupational Stress Questionnaire    Feeling of Stress : Not at all  Social Connections: Unknown (03/03/2023)   Social Connection and Isolation Panel [NHANES]    Frequency of Communication with Friends and Family: More than three times a week    Frequency of Social Gatherings with Friends and Family: Once a week    Attends Religious Services: Patient declined    Database administrator or Organizations: No    Attends Banker Meetings: Never    Marital Status: Married    Activities of Daily Living    03/01/2023   11:43 AM  In your present state of health, do you have any difficulty performing the following activities:  Hearing? 0  Vision? 0  Difficulty concentrating or making decisions? 0  Walking or climbing stairs? 1  Dressing or bathing? 0  Doing errands, shopping? 0  Preparing Food and eating ? N  Using the Toilet? N  In the past six months, have you accidently leaked urine? N  Do you have problems with loss of bowel control? N  Managing your Medications? N  Managing your Finances? N    Patient Education/ Literacy How often do you need to have someone help you when you read instructions, pamphlets, or other written materials from your doctor or pharmacy?: 1 - Never What is the last grade level you completed in school?: 12th grade  Exercise    Diet Patient reports consuming 2 meals a day and 2 snack(s) a day Patient reports that her primary  diet is: Regular Patient reports that she does have regular access to food.   Depression Screen    03/03/2023    1:51 PM 07/09/2022    3:06 PM 07/09/2022    3:05 PM 02/16/2022    2:33 PM 12/29/2021    8:23 AM 02/12/2021    9:38 AM 06/12/2020    9:30 AM  PHQ 2/9 Scores  PHQ - 2 Score 0 1 1 0 2 0 0  PHQ- 9 Score  2  1 7        Fall Risk    03/03/2023    1:50 PM 03/01/2023   11:43 AM 07/09/2022    3:05 PM 02/13/2022    5:07 AM 12/29/2021    8:23 AM  Fall Risk   Falls in the past year? 1 1 1  0 0  Number falls in past yr: 0 0 0  0  Injury with Fall? 1 1 0  0  Risk for fall due to : History of fall(s)  No Fall Risks  No Fall Risks  Follow up Falls evaluation completed;Education provided;Falls prevention discussed  Falls evaluation completed  Falls prevention discussed     Objective:  Rachel Carr seemed alert and oriented and she participated appropriately during our telephone visit.  Blood Pressure Weight BMI  BP Readings from Last 3 Encounters:  12/14/22 (!) 153/72  11/18/22 (!) 178/108  10/31/22 135/84   Wt Readings from Last 3 Encounters:  09/07/22 177 lb 6.4 oz (80.5 kg)  07/09/22 174 lb (78.9 kg)  12/29/21 171 lb (77.6 kg)   BMI Readings from Last 1 Encounters:  09/07/22 33.52 kg/m    *Unable to obtain current vital signs, weight, and BMI due to telephone visit type  Hearing/Vision  Jalissa did not seem to have difficulty with hearing/understanding during the telephone conversation Reports that she has had a formal eye exam by an eye care professional within the past year Reports that she has not  had a formal hearing evaluation within the past year *Unable to fully assess hearing and vision during telephone visit type  Cognitive Function:    03/03/2023    1:53 PM 02/16/2022    2:34 PM 02/12/2021    9:48 AM  6CIT Screen  What Year? 0 points 0 points 0 points  What month? 0 points 0 points 0 points  What time? 0 points 0 points 0 points  Count back from 20 0  points 0 points 0 points  Months in reverse 0 points 0 points 0 points  Repeat phrase 2 points 2 points 0 points  Total Score 2 points 2 points 0 points   (Normal:0-7, Significant for Dysfunction: >8)  Normal Cognitive Function Screening: Yes   Immunization & Health Maintenance Record Immunization History  Administered Date(s) Administered   Fluad Quad(high Dose 65+) 06/12/2020, 07/01/2021   Influenza, High Dose Seasonal PF 05/03/2019   PNEUMOCOCCAL CONJUGATE-20 12/12/2020   Tdap 11/02/2019    Health Maintenance  Topic Date Due   COVID-19 Vaccine (1 - 2023-24 season) 07/26/2023 (Originally 04/10/2022)   Zoster Vaccines- Shingrix (1 of 2) 08/08/2023 (Originally 11/21/2000)   DEXA SCAN  03/02/2024 (Originally 11/04/2022)   INFLUENZA VACCINE  03/11/2023   Medicare Annual Wellness (AWV)  03/02/2024   MAMMOGRAM  05/13/2024   Colonoscopy  06/07/2025   DTaP/Tdap/Td (2 - Td or Tdap) 11/01/2029   Pneumonia Vaccine 62+ Years old  Completed   Hepatitis C Screening  Completed   HPV VACCINES  Aged Out       Assessment  This is a routine wellness examination for Rachel Carr.  Health Maintenance: Due or Overdue There are no preventive care reminders to display for this patient.   Rachel Carr does not need a referral for Community Assistance: Care Management:   no Social Work:    no Prescription Assistance:  no Nutrition/Diabetes Education:  no   Plan:  Personalized Goals  Goals Addressed               This Visit's Progress     Patient Stated (pt-stated)        Patient stated that she would like to see if her pain can improve.        Personalized Health Maintenance & Screening Recommendations  Screening mammography Bone densitometry screening   Lung Cancer Screening Recommended: yes (Low Dose CT Chest recommended if Age 38-80 years, 20 pack-year currently smoking OR have quit w/in past 15 years) Hepatitis C Screening recommended: no HIV Screening  recommended: no  Advanced Directives: Written information was not prepared per patient's request.  Referrals & Orders Orders Placed This Encounter  Procedures   DEXAScan   Mammogram 3D SCREEN BREAST BILATERAL    Follow-up Plan Follow-up with Agapito Games, MD as planned Medicare wellness visit in one year.  AVS printed and mailed to the patient.    I have personally reviewed and noted the following in the patient's chart:   Medical and social history Use of alcohol, tobacco or illicit drugs  Current medications and supplements Functional ability and status Nutritional status Physical activity Advanced directives List of other physicians Hospitalizations, surgeries, and ER visits in previous 12 months Vitals Screenings to include cognitive, depression, and falls Referrals and appointments  In addition, I have reviewed and discussed with Rachel Carr certain preventive protocols, quality metrics, and best practice recommendations. A written personalized care plan for preventive services as well as general preventive health recommendations is available and  can be mailed to the patient at her request.      Modesto Charon, RN BSN  03/03/2023

## 2023-03-10 ENCOUNTER — Ambulatory Visit
Admission: RE | Admit: 2023-03-10 | Discharge: 2023-03-10 | Disposition: A | Discharge status: Home / Self Care | Payer: Medicare HMO | Source: Ambulatory Visit | Attending: Sports Medicine | Admitting: Sports Medicine

## 2023-03-10 DIAGNOSIS — M4727 Other spondylosis with radiculopathy, lumbosacral region: Secondary | ICD-10-CM | POA: Diagnosis not present

## 2023-03-10 DIAGNOSIS — M5416 Radiculopathy, lumbar region: Secondary | ICD-10-CM

## 2023-03-10 MED ORDER — METHYLPREDNISOLONE ACETATE 40 MG/ML INJ SUSP (RADIOLOG
80.0000 mg | Freq: Once | INTRAMUSCULAR | Status: AC
  Administered 2023-03-10: 80 mg via EPIDURAL

## 2023-03-10 MED ORDER — IOPAMIDOL (ISOVUE-M 200) INJECTION 41%
1.0000 mL | Freq: Once | INTRAMUSCULAR | Status: AC
  Administered 2023-03-10: 1 mL via EPIDURAL

## 2023-03-10 NOTE — Discharge Instructions (Signed)

## 2023-03-11 ENCOUNTER — Other Ambulatory Visit: Payer: Self-pay | Admitting: Family Medicine

## 2023-03-12 ENCOUNTER — Telehealth: Payer: Self-pay | Admitting: Acute Care

## 2023-03-12 DIAGNOSIS — Z122 Encounter for screening for malignant neoplasm of respiratory organs: Secondary | ICD-10-CM

## 2023-03-12 DIAGNOSIS — Z87891 Personal history of nicotine dependence: Secondary | ICD-10-CM

## 2023-03-12 NOTE — Telephone Encounter (Signed)
I dont show that this has a precert yet the order does run out in sept is it ok to push out the ct and if so need a new order

## 2023-03-15 ENCOUNTER — Other Ambulatory Visit: Payer: Self-pay | Admitting: Radiology

## 2023-03-16 NOTE — Telephone Encounter (Signed)
Lmam for patient letting her know I have the appt moved for her

## 2023-03-16 NOTE — Telephone Encounter (Signed)
New order placed for patient to have CT in October. Previous order will still need to be cancelled. I could not cancel current LDCT order as it was assigned to a date.

## 2023-03-25 NOTE — Telephone Encounter (Signed)
Patient left Vm that she has approval letter from [facility] for LDCT.  Will need to schedule late Sept or Oct per 2023 annual CT.  Attempt to call back to schedule patient but went to VM. Left her a message that she will have to be scheduled at [facility].

## 2023-04-26 ENCOUNTER — Ambulatory Visit: Payer: Medicare HMO

## 2023-05-19 ENCOUNTER — Ambulatory Visit: Payer: Medicare HMO

## 2023-05-19 ENCOUNTER — Encounter: Payer: Self-pay | Admitting: Family Medicine

## 2023-05-19 DIAGNOSIS — Z1231 Encounter for screening mammogram for malignant neoplasm of breast: Secondary | ICD-10-CM | POA: Diagnosis not present

## 2023-05-19 DIAGNOSIS — Z780 Asymptomatic menopausal state: Secondary | ICD-10-CM | POA: Diagnosis not present

## 2023-05-19 DIAGNOSIS — M8580 Other specified disorders of bone density and structure, unspecified site: Secondary | ICD-10-CM | POA: Insufficient documentation

## 2023-05-19 DIAGNOSIS — Z0000 Encounter for general adult medical examination without abnormal findings: Secondary | ICD-10-CM

## 2023-05-19 DIAGNOSIS — M85852 Other specified disorders of bone density and structure, left thigh: Secondary | ICD-10-CM | POA: Diagnosis not present

## 2023-05-19 NOTE — Progress Notes (Signed)
Hi Rachel Carr, your bone density test shows a T-score of -1.9 which is consistent with mildly thin bones called osteopenia.   The current recommendation for osteopenia (mildly thin bones) treatment includes:   #1 calcium-total of 1200 mg of calcium daily.  If you eat a very calcium rich diet you may be able to obtain that without a supplement.  If not, then I recommend calcium 500 mg twice a day.  There are several products over-the-counter such as Caltrate D and Viactiv chews which are great options that contain calcium and vitamin D. #2 vitamin D-recommend 800 international units daily. #3 exercise-recommend 30 minutes of weightbearing exercise 3 days a week.  Resistance training ,such as doing bands and light weights, can be particularly helpful.

## 2023-05-22 NOTE — Progress Notes (Signed)
Please call patient. Normal mammogram.  Repeat in 1 year.

## 2023-05-27 DIAGNOSIS — Z008 Encounter for other general examination: Secondary | ICD-10-CM | POA: Diagnosis not present

## 2023-06-14 ENCOUNTER — Encounter: Payer: Self-pay | Admitting: Family Medicine

## 2023-06-14 DIAGNOSIS — R6889 Other general symptoms and signs: Secondary | ICD-10-CM

## 2023-06-14 NOTE — Telephone Encounter (Signed)
Will order ABIs for full evaluation since screening was abnormal.

## 2023-06-23 ENCOUNTER — Ambulatory Visit (HOSPITAL_COMMUNITY)
Admission: RE | Admit: 2023-06-23 | Discharge: 2023-06-23 | Disposition: A | Discharge status: Home / Self Care | Payer: Medicare HMO | Source: Ambulatory Visit | Attending: Family Medicine | Admitting: Family Medicine

## 2023-06-23 ENCOUNTER — Encounter: Payer: Self-pay | Admitting: Family Medicine

## 2023-06-23 DIAGNOSIS — R6889 Other general symptoms and signs: Secondary | ICD-10-CM | POA: Diagnosis not present

## 2023-06-23 LAB — VAS US ABI WITH/WO TBI
Left ABI: 1.18
Right ABI: 1.09

## 2023-06-23 NOTE — Progress Notes (Signed)
Hi Rachel Carr, great news blood flow to the feet and toes look great.  Breathing checked out great.  So no concerns at least at this point in time.  Also if you could let us know if you have gotten the most recent flu and COVID vaccines would like to get your chart updated.

## 2023-06-29 ENCOUNTER — Other Ambulatory Visit: Payer: Self-pay | Admitting: Family Medicine

## 2023-07-12 ENCOUNTER — Ambulatory Visit
Admission: EM | Admit: 2023-07-12 | Discharge: 2023-07-12 | Disposition: A | Discharge status: Home / Self Care | Payer: Medicare HMO | Attending: Family Medicine | Admitting: Family Medicine

## 2023-07-12 DIAGNOSIS — R519 Headache, unspecified: Secondary | ICD-10-CM

## 2023-07-12 DIAGNOSIS — R0981 Nasal congestion: Secondary | ICD-10-CM

## 2023-07-12 MED ORDER — AMOXICILLIN 875 MG PO TABS: 875.0000 mg | ORAL_TABLET | Freq: Two times a day (BID) | ORAL | 0 refills | Status: DC

## 2023-07-12 MED ORDER — PREDNISONE 20 MG PO TABS: 40.0000 mg | ORAL_TABLET | Freq: Every day | ORAL | 0 refills | Status: DC

## 2023-07-12 NOTE — Discharge Instructions (Signed)
May take over-the-counter cough or cold medicines Take prednisone daily as directed Tylenol for headache pain or Excedrin Fill amoxicillin and take antibiotic if you fail to improve in 7 to 10 days, or if symptoms worsen

## 2023-07-12 NOTE — ED Triage Notes (Addendum)
Pt c/o nasal congestion and HA since last night. Ibuprofen prn. Last dose 9am. Denies fever. No known covid or flu exposure. Hx of sinus infections.

## 2023-07-12 NOTE — ED Provider Notes (Signed)
[facility]    CSN: [id number] Arrival date & time: 07/12/23  1155      History   Chief Complaint Chief Complaint  Patient presents with   Nasal Congestion   Headache    HPI Rachel Carr is a 72 y.o. female.   HPI  Rachel Carr states she has been feeling well for a few days.  Since yesterday has had sinus pressure and pain and woke up this morning with a "terrific headache" states she was around a lot of grandchildren at Thanksgiving.  She states she gets sinus infections every year.  Has nasal congestion, sinus congestion, headache, and some clear sinus drainage.  No cough.  No body aches fever or chills.  No suspicion of COVID or flu (declines testing) Past Medical History:  Diagnosis Date   Allergy    seasonal   Arthritis    Barrett's syndrome    CAD (coronary artery disease)    Emphysema of lung (HCC)    Esophageal stricture    Esophageal ulcer 09/2010   gastritis Dr. Jason Fila   Gastric polyps    colonoscopy 2012(due again for repeat 2017)   GERD (gastroesophageal reflux disease)    Shingles    on the right eye    Patient Active Problem List   Diagnosis Date Noted   Osteopenia 05/19/2023   Left lumbar radiculopathy 12/01/2022   Left ear impacted cerumen 07/09/2022   Benign paroxysmal positional vertigo, bilateral 07/09/2022   Arthralgia 07/09/2022   Muscle pain 07/09/2022   Myalgia 07/09/2022   Aortic atherosclerosis (HCC) 05/01/2022   Constipation 07/01/2021   Pulmonary emphysema (HCC) 07/01/2021   Bilateral primary osteoarthritis of knee 11/26/2016   CAD S/P percutaneous coronary angioplasty 09/29/2016   Unstable angina (HCC) 02/27/2016   IFG (impaired fasting glucose) 11/20/2015   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 03/17/2012   Regurgitation 12/10/2011   Obese 10/14/2011   Barrett's esophagus 01/13/2011   Tobacco use disorder 01/02/2011   COLONIC POLYPS 10/10/2010   ULCER OF ESOPHAGUS WITHOUT BLEEDING  10/10/2010   GERD 08/15/2010   Diaphragmatic hernia 08/12/2010   Herpes zoster 04/03/2009    Past Surgical History:  Procedure Laterality Date   CARPAL TUNNEL RELEASE     CHOLECYSTECTOMY     COMPLETE MASTECTOMY W/ SENTINEL NODE BIOPSY     JOINT REPLACEMENT     NISSEN FUNDOPLICATION  2012   RADIOFREQUENCY ABLATION     RT knee surgery     TOTAL ABDOMINAL HYSTERECTOMY W/ BILATERAL SALPINGOOPHORECTOMY     endometriosis    OB History   No obstetric history on file.      Home Medications    Prior to Admission medications   Medication Sig Start Date End Date Taking? Authorizing Provider  amoxicillin (AMOXIL) 875 MG tablet Take 1 tablet (875 mg total) by mouth 2 (two) times daily. 07/12/23  Yes Eustace Moore, MD  predniSONE (DELTASONE) 20 MG tablet Take 2 tablets (40 mg total) by mouth daily with breakfast. 07/12/23  Yes Eustace Moore, MD  albuterol (VENTOLIN HFA) 108 (90 Base) MCG/ACT inhaler INHALE 2 PUFFS INTO THE LUNGS EVERY 4 (FOUR) HOURS AS NEEDED FOR WHEEZING 12/16/22   Agapito Games, MD  atorvastatin (LIPITOR) 80 MG tablet TAKE 1 TABLET BY MOUTH EVERYDAY AT BEDTIME 02/22/23   Agapito Games, MD  carvedilol (COREG) 6.25 MG tablet TAKE 1 TABLET (6.25 MG TOTAL) BY MOUTH 2 (TWO) TIMES DAILY WITH A MEAL. NO REFILLS. NEEDS AN APPT.  07/05/23   Agapito Games, MD  [facility] ASPIRIN 81 MG chewable tablet Chew 1 tablet by mouth. 02/28/16   [provider]  pantoprazole (PROTONIX) 40 MG tablet TAKE 1 TABLET BY MOUTH EVERY DAY 12/07/22   Agapito Games, MD    Family History Family History  Problem Relation Age of Onset   Heart disease Mother    Diabetes Father    Cancer Father        prostrate   Cancer Sister        esophagus   Diabetes Sister    Cancer Brother        kidney   Obesity Neg Hx     Social History Social History   Tobacco Use   Smoking status: Former    Current packs/day: 0.00    Average packs/day: 0.5 packs/day  for 20.0 years (10.0 ttl pk-yrs)    Types: Cigarettes    Start date: 08/11/1995    Quit date: 08/11/2015    Years since quitting: 7.9   Smokeless tobacco: Never  Vaping Use   Vaping status: Former  Substance Use Topics   Alcohol use: Not Currently    Comment: occasionally   Drug use: No     Allergies   Nsaids and Tramadol   Review of Systems Review of Systems See HPI  Physical Exam Triage Vital Signs ED Triage Vitals  Encounter Vitals Group     BP 07/12/23 1200 (!) 144/87     Systolic BP Percentile --      Diastolic BP Percentile --      Pulse Rate 07/12/23 1200 83     Resp 07/12/23 1200 17     Temp 07/12/23 1200 97.7 F (36.5 C)     Temp Source 07/12/23 1200 Oral     SpO2 07/12/23 1200 97 %     Weight --      Height --      Head Circumference --      Peak Flow --      Pain Score 07/12/23 1203 10     Pain Loc --      Pain Education --      Exclude from Growth Chart --    No data found.  Updated Vital Signs BP (!) 144/87 (BP Location: Right Arm)   Pulse 83   Temp 97.7 F (36.5 C) (Oral)   Resp 17   SpO2 97%       Physical Exam Constitutional:      General: She is not in acute distress.    Appearance: She is well-developed and normal weight. She is ill-appearing.  HENT:     Head: Normocephalic and atraumatic.     Right Ear: Tympanic membrane and ear canal normal.     Left Ear: Tympanic membrane and ear canal normal.     Nose: Congestion and rhinorrhea present.     Mouth/Throat:     Mouth: Mucous membranes are moist.     Pharynx: Posterior oropharyngeal erythema present.  Eyes:     Conjunctiva/sclera: Conjunctivae normal.     Pupils: Pupils are equal, round, and reactive to light.  Cardiovascular:     Rate and Rhythm: Normal rate and regular rhythm.     Heart sounds: Normal heart sounds.  Pulmonary:     Effort: Pulmonary effort is normal. No respiratory distress.     Breath sounds: Normal breath sounds.  Abdominal:     General: There is no  distension.  Palpations: Abdomen is soft.  Musculoskeletal:        General: Normal range of motion.     Cervical back: Normal range of motion.  Lymphadenopathy:     Cervical: No cervical adenopathy.  Skin:    General: Skin is warm and dry.  Neurological:     Mental Status: She is alert.      UC Treatments / Results  Labs (all labs ordered are listed, but only abnormal results are displayed) Labs Reviewed - No data to display  EKG   Radiology No results found.  Procedures Procedures (including critical care time)  Medications Ordered in UC Medications - No data to display  Initial Impression / Assessment and Plan / UC Course  I have reviewed the triage vital signs and the nursing notes.  Pertinent labs & imaging results that were available during my care of the patient were reviewed by me and considered in my medical decision making (see chart for details).     Explained to the patient this is likely a viral illness.  Symptomatic care is appropriate.  Because of her history of sinus infection she requests an antibiotic.  I did give her a written prescription with instructions not to take unless she fails to improve for a week Final Clinical Impressions(s) / UC Diagnoses   Final diagnoses:  Sinus congestion  Bad headache     Discharge Instructions      May take over-the-counter cough or cold medicines Take prednisone daily as directed Tylenol for headache pain or Excedrin Fill amoxicillin and take antibiotic if you fail to improve in 7 to 10 days, or if symptoms worsen   ED Prescriptions     Medication Sig Dispense Auth. Provider   predniSONE (DELTASONE) 20 MG tablet Take 2 tablets (40 mg total) by mouth daily with breakfast. 10 tablet Eustace Moore, MD   amoxicillin (AMOXIL) 875 MG tablet Take 1 tablet (875 mg total) by mouth 2 (two) times daily. 14 tablet Eustace Moore, MD      PDMP not reviewed this encounter.   Eustace Moore,  MD 07/12/23 1420

## 2023-09-02 ENCOUNTER — Ambulatory Visit: Payer: Medicare HMO

## 2023-09-03 ENCOUNTER — Ambulatory Visit (INDEPENDENT_AMBULATORY_CARE_PROVIDER_SITE_OTHER): Payer: Medicare HMO | Admitting: Family Medicine

## 2023-09-03 ENCOUNTER — Ambulatory Visit (HOSPITAL_COMMUNITY)
Admission: RE | Admit: 2023-09-03 | Discharge: 2023-09-03 | Disposition: A | Discharge status: Home / Self Care | Payer: Medicare HMO | Source: Ambulatory Visit | Attending: Acute Care | Admitting: Acute Care

## 2023-09-03 VITALS — Temp 97.5°F

## 2023-09-03 DIAGNOSIS — Z87891 Personal history of nicotine dependence: Secondary | ICD-10-CM | POA: Insufficient documentation

## 2023-09-03 DIAGNOSIS — Z122 Encounter for screening for malignant neoplasm of respiratory organs: Secondary | ICD-10-CM | POA: Diagnosis not present

## 2023-09-03 DIAGNOSIS — Z23 Encounter for immunization: Secondary | ICD-10-CM | POA: Diagnosis not present

## 2023-09-03 NOTE — Progress Notes (Signed)
Pt here for flu shot. Afebrile,no recent illness. Vaccination given, pt tolerated well.

## 2023-09-03 NOTE — Addendum Note (Signed)
Addended by: Deno Etienne on: 09/03/2023 09:02 AM   Modules accepted: Orders, Level of Service

## 2023-09-11 ENCOUNTER — Other Ambulatory Visit: Payer: Self-pay | Admitting: Family Medicine

## 2023-09-11 DIAGNOSIS — I2510 Atherosclerotic heart disease of native coronary artery without angina pectoris: Secondary | ICD-10-CM

## 2023-09-13 ENCOUNTER — Other Ambulatory Visit: Payer: Self-pay

## 2023-09-13 DIAGNOSIS — Z122 Encounter for screening for malignant neoplasm of respiratory organs: Secondary | ICD-10-CM

## 2023-09-13 DIAGNOSIS — Z87891 Personal history of nicotine dependence: Secondary | ICD-10-CM

## 2023-09-24 DIAGNOSIS — M5416 Radiculopathy, lumbar region: Secondary | ICD-10-CM | POA: Diagnosis not present

## 2023-10-20 DIAGNOSIS — M5416 Radiculopathy, lumbar region: Secondary | ICD-10-CM | POA: Diagnosis not present

## 2023-11-15 DIAGNOSIS — M545 Low back pain, unspecified: Secondary | ICD-10-CM | POA: Diagnosis not present

## 2023-11-29 NOTE — Progress Notes (Signed)
 HPI: FU CAD. Previously followed at [facility]. Cath 7/17 showed 99 RCA and no other obstructive CAD; had PCI of RCA with DES. Last echo 7/17 showed normal LV function.  Nuclear study 11/21 showed EF 78 and no ischemia or infarction.  ABIs November 2024 normal.  Since last seen, she denies dyspnea, chest pain, palpitations or syncope.  Current Outpatient Medications  Medication Sig Dispense Refill   albuterol  (VENTOLIN  HFA) 108 (90 Base) MCG/ACT inhaler INHALE 2 PUFFS INTO THE LUNGS EVERY 4 (FOUR) HOURS AS NEEDED FOR WHEEZING 18 g prn   atorvastatin  (LIPITOR) 80 MG tablet TAKE 1 TABLET BY MOUTH EVERYDAY AT BEDTIME 100 tablet 1   carvedilol  (COREG ) 6.25 MG tablet TAKE 1 TABLET (6.25 MG TOTAL) BY MOUTH 2 (TWO) TIMES DAILY WITH A MEAL. NO REFILLS. NEEDS AN APPT. 180 tablet 1   [facility] ASPIRIN 81 MG chewable tablet Chew 1 tablet by mouth.  11   pantoprazole  (PROTONIX ) 40 MG tablet TAKE 1 TABLET BY MOUTH EVERY DAY 90 tablet 3   No current facility-administered medications for this visit.     Past Medical History:  Diagnosis Date   Allergy    seasonal   Arthritis    Barrett's syndrome    CAD (coronary artery disease)    Emphysema of lung (HCC)    Esophageal stricture    Esophageal ulcer 09/2010   gastritis Dr. Algie Antis   Gastric polyps    colonoscopy 2012(due again for repeat 2017)   GERD (gastroesophageal reflux disease)    Shingles    on the right eye    Past Surgical History:  Procedure Laterality Date   CARPAL TUNNEL RELEASE     CHOLECYSTECTOMY     COMPLETE MASTECTOMY W/ SENTINEL NODE BIOPSY     JOINT REPLACEMENT     NISSEN FUNDOPLICATION  2012   RADIOFREQUENCY ABLATION     RT knee surgery     TOTAL ABDOMINAL HYSTERECTOMY W/ BILATERAL SALPINGOOPHORECTOMY     endometriosis    Social History   Socioeconomic History   Marital status: Married    Spouse name: Gaylin Ke   Number of children: 1   Years of education: 12   Highest education level: 11th grade  Occupational  History    Comment: Retired.  Tobacco Use   Smoking status: Former    Current packs/day: 0.00    Average packs/day: 0.5 packs/day for 20.0 years (10.0 ttl pk-yrs)    Types: Cigarettes    Start date: 08/11/1995    Quit date: 08/11/2015    Years since quitting: 8.3   Smokeless tobacco: Never  Vaping Use   Vaping status: Former  Substance and Sexual Activity   Alcohol use: Not Currently    Comment: occasionally   Drug use: No   Sexual activity: Yes    Birth control/protection: None  Other Topics Concern   Not on file  Social History Narrative   Lives with her husband. She has one child. She enjoys gardening and working outside.   Social Drivers of Corporate investment banker Strain: Low Risk  (03/01/2023)   Overall Financial Resource Strain (CARDIA)    Difficulty of Paying Living Expenses: Not very hard  Food Insecurity: Food Insecurity Present (03/01/2023)   Hunger Vital Sign    Worried About Running Out of Food in the Last Year: Sometimes true    Ran Out of Food in the Last Year: Never true  Transportation Needs: No Transportation Needs (03/01/2023)   PRAPARE -  Administrator, Civil Service (Medical): No    Lack of Transportation (Non-Medical): No  Physical Activity: Patient Declined (03/01/2023)   Exercise Vital Sign    Days of Exercise per Week: Patient declined    Minutes of Exercise per Session: Patient declined  Stress: No Stress Concern Present (03/01/2023)   [employer] of Occupational Health - Occupational Stress Questionnaire    Feeling of Stress : Not at all  Social Connections: Unknown (03/03/2023)   Social Connection and Isolation Panel [NHANES]    Frequency of Communication with Friends and Family: More than three times a week    Frequency of Social Gatherings with Friends and Family: Once a week    Attends Religious Services: Patient declined    Database administrator or Organizations: No    Attends Banker Meetings: Never    Marital  Status: Married  Catering manager Violence: Not At Risk (03/03/2023)   Humiliation, Afraid, Rape, and Kick questionnaire    Fear of Current or Ex-Partner: No    Emotionally Abused: No    Physically Abused: No    Sexually Abused: No    Family History  Problem Relation Age of Onset   Heart disease Mother    Diabetes Father    Cancer Father        prostrate   Cancer Sister        esophagus   Diabetes Sister    Cancer Brother        kidney   Obesity Neg Hx     ROS: Low back pain but no fevers or chills, productive cough, hemoptysis, dysphasia, odynophagia, melena, hematochezia, dysuria, hematuria, rash, seizure activity, orthopnea, PND, pedal edema, claudication. Remaining systems are negative.  Physical Exam: Well-developed well-nourished in no acute distress.  Skin is warm and dry.  HEENT is normal.  Neck is supple.  Chest is clear to auscultation with normal expansion.  Cardiovascular exam is regular rate and rhythm.  Abdominal exam nontender or distended. No masses palpated. Extremities show no edema. neuro grossly intact  EKG Interpretation Date/Time:  Monday Dec 13 2023 10:00:55 EDT Ventricular Rate:  68 PR Interval:  152 QRS Duration:  80 QT Interval:  390 QTC Calculation: 414 R Axis:   -18  Text Interpretation: Normal sinus rhythm Low voltage QRS Non-specific ST-t changes Confirmed by Alexandria Angel ([id number]) on 12/13/2023 10:06:40 AM    A/P  1 coronary artery disease-patient denies chest pain.  Continue aspirin and statin.  2 hyperlipidemia-continue statin.  Check lipids and liver.  3 hypertension-patient's blood pressure is controlled.  Continue present medications.   Alexandria Angel, MD

## 2023-12-03 ENCOUNTER — Other Ambulatory Visit: Payer: Self-pay | Admitting: Family Medicine

## 2023-12-13 ENCOUNTER — Encounter: Payer: Self-pay | Admitting: Cardiology

## 2023-12-13 ENCOUNTER — Ambulatory Visit: Payer: Medicare HMO | Admitting: Cardiology

## 2023-12-13 VITALS — BP 130/80 | HR 68 | Ht 61.0 in | Wt 177.0 lb

## 2023-12-13 DIAGNOSIS — E7800 Pure hypercholesterolemia, unspecified: Secondary | ICD-10-CM

## 2023-12-13 DIAGNOSIS — I10 Essential (primary) hypertension: Secondary | ICD-10-CM | POA: Diagnosis not present

## 2023-12-13 DIAGNOSIS — I2510 Atherosclerotic heart disease of native coronary artery without angina pectoris: Secondary | ICD-10-CM | POA: Diagnosis not present

## 2023-12-13 NOTE — Patient Instructions (Signed)

## 2023-12-14 ENCOUNTER — Encounter: Payer: Self-pay | Admitting: *Deleted

## 2023-12-14 LAB — HEPATIC FUNCTION PANEL
ALT: 12 IU/L (ref 0–32)
AST: 17 IU/L (ref 0–40)
Albumin: 4.2 g/dL (ref 3.8–4.8)
Alkaline Phosphatase: 111 IU/L (ref 44–121)
Bilirubin Total: 0.7 mg/dL (ref 0.0–1.2)
Bilirubin, Direct: 0.28 mg/dL (ref 0.00–0.40)
Total Protein: 6.4 g/dL (ref 6.0–8.5)

## 2023-12-14 LAB — LIPID PANEL
Chol/HDL Ratio: 2 ratio (ref 0.0–4.4)
Cholesterol, Total: 131 mg/dL (ref 100–199)
HDL: 64 mg/dL (ref >39)
LDL Chol Calc (NIH): 53 mg/dL (ref 0–99)
Triglycerides: 71 mg/dL (ref 0–149)
VLDL Cholesterol Cal: 14 mg/dL (ref 5–40)

## 2023-12-27 DIAGNOSIS — M5416 Radiculopathy, lumbar region: Secondary | ICD-10-CM | POA: Diagnosis not present

## 2023-12-28 DIAGNOSIS — Z809 Family history of malignant neoplasm, unspecified: Secondary | ICD-10-CM | POA: Diagnosis not present

## 2023-12-28 DIAGNOSIS — E785 Hyperlipidemia, unspecified: Secondary | ICD-10-CM | POA: Diagnosis not present

## 2023-12-28 DIAGNOSIS — Z791 Long term (current) use of non-steroidal anti-inflammatories (NSAID): Secondary | ICD-10-CM | POA: Diagnosis not present

## 2023-12-28 DIAGNOSIS — I2510 Atherosclerotic heart disease of native coronary artery without angina pectoris: Secondary | ICD-10-CM | POA: Diagnosis not present

## 2023-12-28 DIAGNOSIS — Z833 Family history of diabetes mellitus: Secondary | ICD-10-CM | POA: Diagnosis not present

## 2023-12-28 DIAGNOSIS — Z7982 Long term (current) use of aspirin: Secondary | ICD-10-CM | POA: Diagnosis not present

## 2023-12-28 DIAGNOSIS — E669 Obesity, unspecified: Secondary | ICD-10-CM | POA: Diagnosis not present

## 2023-12-28 DIAGNOSIS — K219 Gastro-esophageal reflux disease without esophagitis: Secondary | ICD-10-CM | POA: Diagnosis not present

## 2023-12-28 DIAGNOSIS — M1990 Unspecified osteoarthritis, unspecified site: Secondary | ICD-10-CM | POA: Diagnosis not present

## 2023-12-28 DIAGNOSIS — I10 Essential (primary) hypertension: Secondary | ICD-10-CM | POA: Diagnosis not present

## 2023-12-28 DIAGNOSIS — Z87891 Personal history of nicotine dependence: Secondary | ICD-10-CM | POA: Diagnosis not present

## 2023-12-28 DIAGNOSIS — J309 Allergic rhinitis, unspecified: Secondary | ICD-10-CM | POA: Diagnosis not present

## 2024-02-08 ENCOUNTER — Ambulatory Visit (INDEPENDENT_AMBULATORY_CARE_PROVIDER_SITE_OTHER): Admitting: Family Medicine

## 2024-02-08 VITALS — BP 136/71 | HR 73 | Ht 61.0 in | Wt 178.1 lb

## 2024-02-08 DIAGNOSIS — M5416 Radiculopathy, lumbar region: Secondary | ICD-10-CM | POA: Diagnosis not present

## 2024-02-08 DIAGNOSIS — R7301 Impaired fasting glucose: Secondary | ICD-10-CM

## 2024-02-08 DIAGNOSIS — Z7689 Persons encountering health services in other specified circumstances: Secondary | ICD-10-CM | POA: Diagnosis not present

## 2024-02-08 MED ORDER — GABAPENTIN 600 MG PO TABS: 600.0000 mg | ORAL_TABLET | Freq: Three times a day (TID) | ORAL | 1 refills | Status: DC

## 2024-02-08 MED ORDER — TOPIRAMATE 25 MG PO TABS
ORAL_TABLET | ORAL | 1 refills | Status: DC
Start: 2024-02-08 — End: 2024-04-05

## 2024-02-08 NOTE — Progress Notes (Signed)
 Established Patient Office Visit  Subjective  Patient ID: Rachel Carr, female    DOB: February 12, 1951  Age: 73 y.o. MRN: [id number]  Chief Complaint  Patient presents with   Sciatica    HPI  Has been seeing Dr. Cesario Mosses [facility] for her back with radiculopathy and left sided sciatica.  He has had 2 epidurals.  She is also on gabapentin for pain.  She feels really frustrated because the feels like she is not getting better and the communication hasn't been good.   She had MRI performed December 01, 2022.  She was noted to have mild multifactorial spinal stenosis at L4-5 with mild narrowing of the lateral recesses and left foramen.  Mild to moderate right foraminal narrowing at L2-3 secondary to asymmetric disc bulging and endplate osteophytes.  Everything looked great from L5-S1.  But her pain is in distribution of her sciatic nerve.  She is just getting to the point where it is actually difficult to walk.  She also wants to discuss her weight she has lost 16 pounds at 1 point before her back pain became more persistent and it has just caused her to slowly put weight back on.  She says no matter what she tries to do she just cannot seem to lose the weight.  But she is also very limited with her physical activity currently.    ROS    Objective:     BP 136/71   Pulse 73   Ht 5' 1 (1.549 m)   Wt 178 lb 1.9 oz (80.8 kg)   SpO2 98%   BMI 33.66 kg/m    Physical Exam Vitals and nursing note reviewed.  Constitutional:      Appearance: Normal appearance.  HENT:     Head: Normocephalic and atraumatic.   Eyes:     Conjunctiva/sclera: Conjunctivae normal.    Cardiovascular:     Rate and Rhythm: Normal rate and regular rhythm.  Pulmonary:     Effort: Pulmonary effort is normal.     Breath sounds: Normal breath sounds.   Skin:    General: Skin is warm and dry.   Neurological:     Mental Status: She is alert.   Psychiatric:        Mood and Affect: Mood normal.       No results found for any visits on 02/08/24.    The 10-year ASCVD risk score (Arnett DK, et al., 2019) is: 26%    Assessment & Plan:   Problem List Items Addressed This Visit       Endocrine   IFG (impaired fasting glucose)   Due for repeat A1c.  It has been in the sixes before her last 1 was 5.4 but almost a year ago.  Lab Results  Component Value Date   HGBA1C 5.4 12/29/2021         Relevant Orders   Basic Metabolic Panel (BMET)   Hemoglobin A1c     Nervous and Auditory   Left lumbar radiculopathy - Primary   MRI was a little over a year ago in April 2024.  It did not show any nerve compression specifically on the sciatic nerve.  She has pain that radiates down into her left leg.  She has had epidural injections without significant improvement or relief.  The current orthopedist that she has she just feels like she is not communicating well with does not feel like her questions are being answered.  We discussed referral to a new  orthopedist or neurosurgeon for further workup since her pain has been very persistent.  She is getting some relief with gabapentin so I will take over that prescription for her.  New prescription sent to pharmacy.      Relevant Medications   gabapentin (NEURONTIN) 600 MG tablet   topiramate (TOPAMAX) 25 MG tablet   Other Relevant Orders   Basic Metabolic Panel (BMET)   Hemoglobin A1c   Ambulatory referral to Orthopedic Surgery     Other   Encounter for weight management   Visit #: 1 Starting Weight:178 lbs   Current weight: 178 lbs  Previous weight: 177 klb  Change in weight: up 1 lb  Goal weight: Dietary goals:Increase protein and decrease carbs, use a smart phone app to track calories  Exercise goals: none right now.  Medication: consider trial of low dose topamax.  Follow-up and referrals: 1 month       Relevant Medications   topiramate (TOPAMAX) 25 MG tablet    Return in about 1 month (around 03/10/2024).     Dorothyann Byars, MD

## 2024-02-08 NOTE — Assessment & Plan Note (Signed)
 MRI was a little over a year ago in April 2024.  It did not show any nerve compression specifically on the sciatic nerve.  She has pain that radiates down into her left leg.  She has had epidural injections without significant improvement or relief.  The current orthopedist that she has she just feels like she is not communicating well with does not feel like her questions are being answered.  We discussed referral to a new orthopedist or neurosurgeon for further workup since her pain has been very persistent.  She is getting some relief with gabapentin so I will take over that prescription for her.  New prescription sent to pharmacy.

## 2024-02-08 NOTE — Assessment & Plan Note (Signed)
 Visit #: 1 Starting Weight:178 lbs   Current weight: 178 lbs  Previous weight: 177 klb  Change in weight: up 1 lb  Goal weight: Dietary goals:Increase protein and decrease carbs, use a smart phone app to track calories  Exercise goals: none right now.  Medication: consider trial of low dose topamax.  Follow-up and referrals: 1 month

## 2024-02-08 NOTE — Patient Instructions (Signed)
 Encourage you to work on increasing your protein intake and really cutting back on carbohydrate intake on a daily basis.  I do also recommend a smart phone app such as my fitness pal or lose it to help you set goals for calorie counting and track those goals.  You put in your current height weight and your weight loss goals and it we will set the calories for you.

## 2024-02-08 NOTE — Assessment & Plan Note (Signed)
 Due for repeat A1c.  It has been in the sixes before her last 1 was 5.4 but almost a year ago.  Lab Results  Component Value Date   HGBA1C 5.4 12/29/2021

## 2024-02-09 ENCOUNTER — Ambulatory Visit: Payer: Self-pay | Admitting: Family Medicine

## 2024-02-09 LAB — BASIC METABOLIC PANEL WITH GFR
BUN/Creatinine Ratio: 15 (ref 12–28)
BUN: 13 mg/dL (ref 8–27)
CO2: 22 mmol/L (ref 20–29)
Calcium: 9.4 mg/dL (ref 8.7–10.3)
Chloride: 104 mmol/L (ref 96–106)
Creatinine, Ser: 0.87 mg/dL (ref 0.57–1.00)
Glucose: 102 mg/dL — ABNORMAL HIGH (ref 70–99)
Potassium: 4.5 mmol/L (ref 3.5–5.2)
Sodium: 143 mmol/L (ref 134–144)
eGFR: 70 mL/min/{1.73_m2} (ref >59)

## 2024-02-09 LAB — HEMOGLOBIN A1C
Est. average glucose Bld gHb Est-mCnc: 120 mg/dL
Hgb A1c MFr Bld: 5.8 % — ABNORMAL HIGH (ref 4.8–5.6)

## 2024-02-09 NOTE — Progress Notes (Signed)
 Hi Becky, metabolic panel overall looks good.  A1c is 5.8 into the prediabetes range.  Just make sure not getting into too many sweets or sugary beverages.

## 2024-02-25 ENCOUNTER — Other Ambulatory Visit: Payer: Self-pay | Admitting: Family Medicine

## 2024-03-07 ENCOUNTER — Ambulatory Visit: Payer: Medicare HMO

## 2024-03-07 VITALS — Ht 61.0 in | Wt 176.0 lb

## 2024-03-07 DIAGNOSIS — Z0000 Encounter for general adult medical examination without abnormal findings: Secondary | ICD-10-CM | POA: Diagnosis not present

## 2024-03-07 NOTE — Progress Notes (Signed)
 Subjective:   Rachel Carr is a 73 y.o. female who presents for Medicare Annual (Subsequent) preventive examination.  Visit Complete: Virtual I connected with  Rachel Carr on 03/07/24 by a audio enabled telemedicine application and verified that I am speaking with the correct person using two identifiers.  Patient Location: Home  Provider Location: Office/Clinic  I discussed the limitations of evaluation and management by telemedicine. The patient expressed understanding and agreed to proceed.  Vital Signs: Because this visit was a virtual/telehealth visit, some criteria may be missing or patient reported. Any vitals not documented were not able to be obtained and vitals that have been documented are patient reported.  Patient Medicare AWV questionnaire was completed by the patient on 03/06/2024; I have confirmed that all information answered by patient is correct and no changes since this date.  Cardiac Risk Factors include: advanced age (>37men, >28 women);obesity (BMI >30kg/m2);dyslipidemia;family history of premature cardiovascular disease     Objective:    Today's Vitals   03/07/24 1400 03/07/24 1401  Weight: 176 lb (79.8 kg)   Height: 5' 1 (1.549 m)   PainSc:  4    Body mass index is 33.25 kg/m.     03/07/2024    2:11 PM 03/03/2023    1:47 PM 02/16/2022    2:29 PM 02/12/2021    9:38 AM 12/02/2016    2:14 PM 08/30/2014    1:47 PM  Advanced Directives  Does Patient Have a Medical Advance Directive? No No No No No  No   Would patient like information on creating a medical advance directive? No - Patient declined No - Patient declined No - Patient declined No - Patient declined No - Patient declined  Yes - Educational materials given      Data saved with a previous flowsheet row definition    Current Medications (verified) Outpatient Encounter Medications as of 03/07/2024  Medication Sig   atorvastatin  (LIPITOR) 80 MG tablet TAKE 1 TABLET BY MOUTH EVERYDAY  AT BEDTIME   carvedilol  (COREG ) 6.25 MG tablet Take 1 tablet (6.25 mg total) by mouth 2 (two) times daily with a meal. NO REFILLS. NEEDS AN APPT.   [facility] ASPIRIN 81 MG chewable tablet Chew 1 tablet by mouth.   gabapentin  (NEURONTIN ) 600 MG tablet Take 1 tablet (600 mg total) by mouth 3 (three) times daily.   pantoprazole  (PROTONIX ) 40 MG tablet TAKE 1 TABLET BY MOUTH EVERY DAY   topiramate  (TOPAMAX ) 25 MG tablet Take 1 tablet (25 mg total) by mouth daily for 3 days, THEN 1 tablet (25 mg total) 2 (two) times daily for 27 days.   albuterol  (VENTOLIN  HFA) 108 (90 Base) MCG/ACT inhaler INHALE 2 PUFFS INTO THE LUNGS EVERY 4 (FOUR) HOURS AS NEEDED FOR WHEEZING (Patient not taking: Reported on 03/07/2024)   Gabapentin  10 % CREA  (Patient not taking: Reported on 03/07/2024)   No facility-administered encounter medications on file as of 03/07/2024.    Allergies (verified) Nsaids and Tramadol    History: Past Medical History:  Diagnosis Date   Allergy    seasonal   Arthritis    Barrett's syndrome    CAD (coronary artery disease)    Emphysema of lung (HCC)    Encounter for weight management 02/08/2024   Esophageal stricture    Esophageal ulcer 09/2010   gastritis Dr. Lucio   Gastric polyps    colonoscopy 2012(due again for repeat 2017)   GERD (gastroesophageal reflux disease)    Shingles  on the right eye   Past Surgical History:  Procedure Laterality Date   CARPAL TUNNEL RELEASE     CHOLECYSTECTOMY     COMPLETE MASTECTOMY W/ SENTINEL NODE BIOPSY     JOINT REPLACEMENT     NISSEN FUNDOPLICATION  2012   RADIOFREQUENCY ABLATION     RT knee surgery     TOTAL ABDOMINAL HYSTERECTOMY W/ BILATERAL SALPINGOOPHORECTOMY     endometriosis   Family History  Problem Relation Age of Onset   Heart disease Mother    Diabetes Father    Cancer Father        prostrate   Cancer Sister        esophagus   Diabetes Sister    Cancer Brother        kidney   Obesity Neg Hx    Social History    Socioeconomic History   Marital status: Married    Spouse name: [place]   Number of children: 1   Years of education: 12   Highest education level: 12th grade  Occupational History    Comment: Retired.  Tobacco Use   Smoking status: Former    Current packs/day: 0.00    Average packs/day: 0.5 packs/day for 20.0 years (10.0 ttl pk-yrs)    Types: Cigarettes    Start date: 08/11/1995    Quit date: 08/11/2015    Years since quitting: 8.5   Smokeless tobacco: Never  Vaping Use   Vaping status: Former  Substance and Sexual Activity   Alcohol use: Not Currently    Comment: occasionally   Drug use: No   Sexual activity: Yes    Birth control/protection: None  Other Topics Concern   Not on file  Social History Narrative   Lives with her husband. She has one child. She enjoys gardening and working outside.   Social Drivers of Corporate investment banker Strain: Low Risk  (03/07/2024)   Overall Financial Resource Strain (CARDIA)    Difficulty of Paying Living Expenses: Not hard at all  Recent Concern: Financial Resource Strain - Medium Risk (02/08/2024)   Overall Financial Resource Strain (CARDIA)    Difficulty of Paying Living Expenses: Somewhat hard  Food Insecurity: No Food Insecurity (03/07/2024)   Hunger Vital Sign    Worried About Running Out of Food in the Last Year: Never true    Ran Out of Food in the Last Year: Never true  Transportation Needs: No Transportation Needs (03/07/2024)   PRAPARE - Administrator, Civil Service (Medical): No    Lack of Transportation (Non-Medical): No  Physical Activity: Inactive (03/07/2024)   Exercise Vital Sign    Days of Exercise per Week: 0 days    Minutes of Exercise per Session: 0 min  Stress: No Stress Concern Present (03/07/2024)   [employer] of Occupational Health - Occupational Stress Questionnaire    Feeling of Stress: Not at all  Social Connections: Moderately Integrated (03/07/2024)   Social Connection and Isolation  Panel    Frequency of Communication with Friends and Family: More than three times a week    Frequency of Social Gatherings with Friends and Family: More than three times a week    Attends Religious Services: Never    Database administrator or Organizations: Yes    Attends Banker Meetings: Never    Marital Status: Married  Recent Concern: Social Connections - Moderately Isolated (02/08/2024)   Social Connection and Isolation Panel    Frequency of Communication  with Friends and Family: More than three times a week    Frequency of Social Gatherings with Friends and Family: Once a week    Attends Religious Services: Patient declined    Database administrator or Organizations: No    Attends Engineer, structural: Not on file    Marital Status: Married    Tobacco Counseling Counseling given: Not Answered   Clinical Intake:  Pre-visit preparation completed: Yes  Pain : 0-10 Pain Score: 4  Pain Type: Chronic pain Pain Location: Leg Pain Orientation: Left Pain Descriptors / Indicators: Aching, Burning, Shooting, Sharp Pain Onset: More than a month ago Pain Frequency: Intermittent     BMI - recorded: 33.25 Nutritional Status: BMI > 30  Obese Nutritional Risks: None Diabetes: No  How often do you need to have someone help you when you read instructions, pamphlets, or other written materials from your doctor or pharmacy?: 1 - Never What is the last grade level you completed in school?: 11  Interpreter Needed?: No      Activities of Daily Living    03/07/2024    2:02 PM 03/06/2024   10:55 AM  In your present state of health, do you have any difficulty performing the following activities:  Hearing? 0 0  Vision? 0 0  Difficulty concentrating or making decisions? 0 0  Walking or climbing stairs? 0 0  Dressing or bathing? 0 0  Doing errands, shopping? 0 0  Preparing Food and eating ? N N  Using the Toilet? N N  In the past six months, have you  accidently leaked urine? N N  Do you have problems with loss of bowel control? N N  Managing your Medications? N N  Managing your Finances? N N  Housekeeping or managing your Housekeeping? N N    Patient Care Team: Alvan Dorothyann BIRCH, MD as PCP - General (Family Medicine) Pietro Redell RAMAN, MD as PCP - Cardiology (Cardiology) Lucio Elsie BROCKS, MD as Referring Physician (Gastroenterology)  Indicate any recent Medical Services you may have received from other than [facility] providers in the past year (date may be approximate).     Assessment:   This is a routine wellness examination for Rachel Carr.  Hearing/Vision screen No results found.   Goals Addressed             This Visit's Progress    Patient Stated       Patient states she would like to find a resolution of her leg pain.        Depression Screen    03/07/2024    2:09 PM 02/08/2024    9:43 AM 03/03/2023    1:51 PM 07/09/2022    3:06 PM 07/09/2022    3:05 PM 02/16/2022    2:33 PM 12/29/2021    8:23 AM  PHQ 2/9 Scores  PHQ - 2 Score 0 0 0 1 1 0 2  PHQ- 9 Score    2  1 7     Fall Risk    03/07/2024    2:11 PM 03/06/2024   10:55 AM 02/08/2024    9:43 AM 03/03/2023    1:50 PM 03/01/2023   11:43 AM  Fall Risk   Falls in the past year? 0 0 0 1 1  Number falls in past yr: 0  0 0 0  Injury with Fall? 0  0 1 1  Risk for fall due to : No Fall Risks  No Fall Risks History of fall(s)  Follow up Falls evaluation completed  Falls evaluation completed Falls evaluation completed;Education provided;Falls prevention discussed     MEDICARE RISK AT HOME: Medicare Risk at Home Any stairs in or around the home?: Yes If so, are there any without handrails?: No Home free of loose throw rugs in walkways, pet beds, electrical cords, etc?: Yes Adequate lighting in your home to reduce risk of falls?: Yes Life alert?: No Use of a cane, walker or w/c?: Yes Grab bars in the bathroom?: Yes Shower chair or bench in shower?: No Elevated  toilet seat or a handicapped toilet?: Yes  TIMED UP AND GO:  Was the test performed?  No    Cognitive Function:        03/07/2024    2:12 PM 03/03/2023    1:53 PM 02/16/2022    2:34 PM 02/12/2021    9:48 AM  6CIT Screen  What Year? 0 points 0 points 0 points 0 points  What month? 0 points 0 points 0 points 0 points  What time? 0 points 0 points 0 points 0 points  Count back from 20 0 points 0 points 0 points 0 points  Months in reverse 0 points 0 points 0 points 0 points  Repeat phrase 2 points 2 points 2 points 0 points  Total Score 2 points 2 points 2 points 0 points    Immunizations Immunization History  Administered Date(s) Administered   Fluad Quad(high Dose 65+) 06/12/2020, 07/01/2021   Fluad Trivalent(High Dose 65+) 09/03/2023   Influenza, High Dose Seasonal PF 05/03/2019   PNEUMOCOCCAL CONJUGATE-20 12/12/2020   Respiratory Syncytial Virus Vaccine,Recomb Aduvanted(Arexvy) 08/06/2023   Tdap 11/02/2019    TDAP status: Up to date  Flu Vaccine status: Up to date  Pneumococcal vaccine status: Up to date  Covid-19 vaccine status: Declined, Education has been provided regarding the importance of this vaccine but patient still declined. Advised may receive this vaccine at local pharmacy or Health Dept.or vaccine clinic. Aware to provide a copy of the vaccination record if obtained from local pharmacy or Health Dept. Verbalized acceptance and understanding.  Qualifies for Shingles Vaccine? Yes   Zostavax completed No   Shingrix Completed?: No.    Education has been provided regarding the importance of this vaccine. Patient has been advised to call insurance company to determine out of pocket expense if they have not yet received this vaccine. Advised may also receive vaccine at local pharmacy or Health Dept. Verbalized acceptance and understanding.  Screening Tests Health Maintenance  Topic Date Due   Zoster Vaccines- Shingrix (1 of 2) Never done   COVID-19 Vaccine (1 -  2024-25 season) Never done   INFLUENZA VACCINE  03/10/2024   Medicare Annual Wellness (AWV)  03/07/2025   MAMMOGRAM  05/18/2025   Colonoscopy  06/07/2025   DEXA SCAN  05/18/2028   DTaP/Tdap/Td (2 - Td or Tdap) 11/01/2029   Pneumococcal Vaccine: 50+ Years  Completed   Hepatitis C Screening  Completed   Hepatitis B Vaccines  Aged Out   HPV VACCINES  Aged Out   Meningococcal B Vaccine  Aged Out    Health Maintenance  Health Maintenance Due  Topic Date Due   Zoster Vaccines- Shingrix (1 of 2) Never done   COVID-19 Vaccine (1 - 2024-25 season) Never done    Colorectal cancer screening: Type of screening: Colonoscopy. Completed 06/07/2020. Repeat every 5 years  Mammogram status: Completed 05/19/2023. Repeat every year  Bone Density status: Completed 05/19/2023. Results reflect: Bone density results: OSTEOPENIA. Repeat  every 2 years.  Lung Cancer Screening: (Low Dose CT Chest recommended if Age 19-80 years, 20 pack-year currently smoking OR have quit w/in 15years.) does qualify.   Lung Cancer Screening Referral: patient already in program  Additional Screening:  Hepatitis C Screening: does qualify; Completed 11/19/2015  Vision Screening: Recommended annual ophthalmology exams for early detection of glaucoma and other disorders of the eye. Is the patient up to date with their annual eye exam?  Yes  Who is the provider or what is the name of the office in which the patient attends annual eye exams? [facility] If pt is not established with a provider, would they like to be referred to a provider to establish care? N/a.   Dental Screening: Recommended annual dental exams for proper oral hygiene   Community Resource Referral / Chronic Care Management: CRR required this visit?  No   CCM required this visit?  No     Plan:     I have personally reviewed and noted the following in the patient's chart:   Medical and social history Use of alcohol, tobacco or illicit drugs   Current medications and supplements including opioid prescriptions. Patient is not currently taking opioid prescriptions. Functional ability and status Nutritional status Physical activity Advanced directives List of other physicians Hospitalizations, surgeries, and ER visits in previous 12 months. None Vitals Screenings to include cognitive, depression, and falls Referrals and appointments  In addition, I have reviewed and discussed with patient certain preventive protocols, quality metrics, and best practice recommendations. A written personalized care plan for preventive services as well as general preventive health recommendations were provided to patient.     Bonny Jon Mayor, CMA   03/07/2024   After Visit Summary: (MyChart) Due to this being a telephonic visit, the after visit summary with patients personalized plan was offered to patient via MyChart   Nurse Notes:    Rachel Carr is a 73 y.o. female patient of Metheney, Dorothyann BIRCH, MD who had a Medicare Annual Wellness Visit today via telephone. Rachel Carr is Retired and lives with their spouse. she has 1 child. She reports that she is socially active and does interact with friends/family regularly. She is moderately physically active and enjoys gardening.

## 2024-03-07 NOTE — Patient Instructions (Signed)
  Rachel Carr , Thank you for taking time to come for your Medicare Wellness Visit. I appreciate your ongoing commitment to your health goals. Please review the following plan we discussed and let me know if I can assist you in the future.   These are the goals we discussed:  Goals       Patient Stated (pt-stated)      02/12/2021 AWV Goal: Exercise for General Health  Patient will verbalize understanding of the benefits of increased physical activity: Exercising regularly is important. It will improve your overall fitness, flexibility, and endurance. Regular exercise also will improve your overall health. It can help you control your weight, reduce stress, and improve your bone density. Over the next year, patient will increase physical activity as tolerated with a goal of at least 150 minutes of moderate physical activity per week.  You can tell that you are exercising at a moderate intensity if your heart starts beating faster and you start breathing faster but can still hold a conversation. Moderate-intensity exercise ideas include: Walking 1 mile (1.6 km) in about 15 minutes Biking Hiking Golfing Dancing Water aerobics Patient will verbalize understanding of everyday activities that increase physical activity by providing examples like the following: Yard work, such as: Insurance underwriter Gardening Washing windows or floors Patient will be able to explain general safety guidelines for exercising:  Before you start a new exercise program, talk with your health care provider. Do not exercise so much that you hurt yourself, feel dizzy, or get very short of breath. Wear comfortable clothes and wear shoes with good support. Drink plenty of water while you exercise to prevent dehydration or heat stroke. Work out until your breathing and your heartbeat get faster.       Patient Stated (pt-stated)       Would like to loose 20 lbs.      Patient Stated (pt-stated)      Patient stated that she would like to see if her pain can improve.       Patient Stated      Patient states she would like to find a resolution of her leg pain.         This is a list of the screening recommended for you and due dates:  Health Maintenance  Topic Date Due   Zoster (Shingles) Vaccine (1 of 2) Never done   COVID-19 Vaccine (1 - 2024-25 season) Never done   Flu Shot  03/10/2024   Medicare Annual Wellness Visit  03/07/2025   Mammogram  05/18/2025   Colon Cancer Screening  06/07/2025   DEXA scan (bone density measurement)  05/18/2028   DTaP/Tdap/Td vaccine (2 - Td or Tdap) 11/01/2029   Pneumococcal Vaccine for age over 77  Completed   Hepatitis C Screening  Completed   Hepatitis B Vaccine  Aged Out   HPV Vaccine  Aged Out   Meningitis B Vaccine  Aged Out

## 2024-03-09 DIAGNOSIS — M5416 Radiculopathy, lumbar region: Secondary | ICD-10-CM | POA: Diagnosis not present

## 2024-03-16 DIAGNOSIS — M5416 Radiculopathy, lumbar region: Secondary | ICD-10-CM | POA: Diagnosis not present

## 2024-03-24 ENCOUNTER — Other Ambulatory Visit: Payer: Self-pay | Admitting: Family Medicine

## 2024-03-24 DIAGNOSIS — I2510 Atherosclerotic heart disease of native coronary artery without angina pectoris: Secondary | ICD-10-CM

## 2024-03-31 DIAGNOSIS — H524 Presbyopia: Secondary | ICD-10-CM | POA: Diagnosis not present

## 2024-03-31 DIAGNOSIS — H52209 Unspecified astigmatism, unspecified eye: Secondary | ICD-10-CM | POA: Diagnosis not present

## 2024-03-31 DIAGNOSIS — H5213 Myopia, bilateral: Secondary | ICD-10-CM | POA: Diagnosis not present

## 2024-04-02 ENCOUNTER — Other Ambulatory Visit: Payer: Self-pay | Admitting: Family Medicine

## 2024-04-02 DIAGNOSIS — Z7689 Persons encountering health services in other specified circumstances: Secondary | ICD-10-CM

## 2024-04-06 ENCOUNTER — Ambulatory Visit
Admission: EM | Admit: 2024-04-06 | Discharge: 2024-04-06 | Disposition: A | Discharge status: Home / Self Care | Attending: Family Medicine | Admitting: Family Medicine

## 2024-04-06 ENCOUNTER — Telehealth: Payer: Self-pay

## 2024-04-06 ENCOUNTER — Ambulatory Visit

## 2024-04-06 DIAGNOSIS — M79671 Pain in right foot: Secondary | ICD-10-CM

## 2024-04-06 DIAGNOSIS — M10071 Idiopathic gout, right ankle and foot: Secondary | ICD-10-CM

## 2024-04-06 DIAGNOSIS — R600 Localized edema: Secondary | ICD-10-CM | POA: Diagnosis not present

## 2024-04-06 MED ORDER — COLCHICINE 0.6 MG PO TABS: ORAL_TABLET | ORAL | 0 refills | Status: DC

## 2024-04-06 NOTE — Discharge Instructions (Addendum)
 Advised patient take medication as directed with food.  Advised patient may take 2 tablets now and may repeat 1 tablet in 1 to 2 hours if pain still persist and right foot.  Advised patient do not exceed 3 tablets in 24 hours.  Advised if symptoms worsen and/or unresolved please follow-up with your PCP, [facility], [facility], or here for further evaluation.

## 2024-04-06 NOTE — Telephone Encounter (Signed)
 Call made to patient about xray results. Per Ozell Major FNP, pt should f/u with podiatry this week or next for further eval. Pt verbalized understanding.

## 2024-04-06 NOTE — ED Provider Notes (Signed)
 [facility]    CSN: [id number] Arrival date & time: 04/06/24  1115      History   Chief Complaint Chief Complaint  Patient presents with   Foot Pain    RT    HPI Rachel Carr is a 73 y.o. female.   HPI Very pleasant 73 year old female presents with right foot pain x 3 days.  Patient reports redness swelling and warmth to hot to touch.  PMH significant for CAD, emphysema, and Barrett syndrome.  Past Medical History:  Diagnosis Date   Allergy    seasonal   Arthritis    Barrett's syndrome    CAD (coronary artery disease)    Emphysema of lung (HCC)    Encounter for weight management 02/08/2024   Esophageal stricture    Esophageal ulcer 09/2010   gastritis Dr. Lucio   Gastric polyps    colonoscopy 2012(due again for repeat 2017)   GERD (gastroesophageal reflux disease)    Shingles    on the right eye    Patient Active Problem List   Diagnosis Date Noted   Encounter for weight management 02/08/2024   Osteopenia 05/19/2023   Left lumbar radiculopathy 12/01/2022   Benign paroxysmal positional vertigo, bilateral 07/09/2022   Arthralgia 07/09/2022   Muscle pain 07/09/2022   Myalgia 07/09/2022   Aortic atherosclerosis (HCC) 05/01/2022   Constipation 07/01/2021   Pulmonary emphysema (HCC) 07/01/2021   Bilateral primary osteoarthritis of knee 11/26/2016   CAD S/P percutaneous coronary angioplasty 09/29/2016   Unstable angina (HCC) 02/27/2016   IFG (impaired fasting glucose) 11/20/2015   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 03/17/2012   Regurgitation 12/10/2011   Obese 10/14/2011   Barrett's esophagus 01/13/2011   Tobacco use disorder 01/02/2011   COLONIC POLYPS 10/10/2010   ULCER OF ESOPHAGUS WITHOUT BLEEDING 10/10/2010   GERD 08/15/2010   Diaphragmatic hernia 08/12/2010   Herpes zoster 04/03/2009    Past Surgical History:  Procedure Laterality Date   CARPAL TUNNEL RELEASE     CHOLECYSTECTOMY     COMPLETE  MASTECTOMY W/ SENTINEL NODE BIOPSY     JOINT REPLACEMENT     NISSEN FUNDOPLICATION  2012   RADIOFREQUENCY ABLATION     RT knee surgery     TOTAL ABDOMINAL HYSTERECTOMY W/ BILATERAL SALPINGOOPHORECTOMY     endometriosis    OB History   No obstetric history on file.      Home Medications    Prior to Admission medications   Medication Sig Start Date End Date Taking? Authorizing Provider  colchicine  0.6 MG tablet Take 2 tabs p.o. now, may repeat 1 tab in 1 to 2 hours if pain is still present.  Do not exceed more than 3 tablets in 24 hours. 04/06/24  Yes Teddy Sharper, FNP  albuterol  (VENTOLIN  HFA) 108 (90 Base) MCG/ACT inhaler INHALE 2 PUFFS INTO THE LUNGS EVERY 4 (FOUR) HOURS AS NEEDED FOR WHEEZING Patient not taking: Reported on 03/07/2024 12/16/22   Alvan Dorothyann BIRCH, MD  atorvastatin  (LIPITOR) 80 MG tablet TAKE 1 TABLET BY MOUTH EVERYDAY AT BEDTIME 03/24/24   Alvan Dorothyann BIRCH, MD  carvedilol  (COREG ) 6.25 MG tablet Take 1 tablet (6.25 mg total) by mouth 2 (two) times daily with a meal. NO REFILLS. NEEDS AN APPT. 02/25/24   Alvan Dorothyann BIRCH, MD  [facility] ASPIRIN 81 MG chewable tablet Chew 1 tablet by mouth. 02/28/16   [provider]  gabapentin  (NEURONTIN ) 600 MG tablet Take 1 tablet (600 mg total) by mouth 3 (  three) times daily. 02/08/24   Alvan Dorothyann BIRCH, MD  Gabapentin  10 % CREA  11/19/23   [provider]  pantoprazole  (PROTONIX ) 40 MG tablet TAKE 1 TABLET BY MOUTH EVERY DAY 12/03/23   Alvan Dorothyann BIRCH, MD  topiramate  (TOPAMAX ) 25 MG tablet Take 1 tablet (25 mg total) by mouth 2 (two) times daily. 04/05/24   Alvan Dorothyann BIRCH, MD    Family History Family History  Problem Relation Age of Onset   Heart disease Mother    Diabetes Father    Cancer Father        prostrate   Cancer Sister        esophagus   Diabetes Sister    Cancer Brother        kidney   Obesity Neg Hx     Social History Social History   Tobacco Use   Smoking  status: Former    Current packs/day: 0.00    Average packs/day: 0.5 packs/day for 20.0 years (10.0 ttl pk-yrs)    Types: Cigarettes    Start date: 08/11/1995    Quit date: 08/11/2015    Years since quitting: 8.6   Smokeless tobacco: Never  Vaping Use   Vaping status: Former  Substance Use Topics   Alcohol use: Not Currently    Comment: occasionally   Drug use: No     Allergies   Nsaids and Tramadol    Review of Systems Review of Systems  Musculoskeletal:        Right foot pain x 3 days, patient reports tingling sensation hot warm red to touch     Physical Exam Triage Vital Signs ED Triage Vitals [04/06/24 1123]  Encounter Vitals Group     BP      Girls Systolic BP Percentile      Girls Diastolic BP Percentile      Boys Systolic BP Percentile      Boys Diastolic BP Percentile      Pulse      Resp      Temp      Temp src      SpO2      Weight      Height      Head Circumference      Peak Flow      Pain Score 10     Pain Loc      Pain Education      Exclude from Growth Chart    No data found.  Updated Vital Signs BP (!) 167/97 (BP Location: Right Arm)   Pulse 70   Temp 98.1 F (36.7 C) (Oral)   Resp 17   SpO2 99%    Physical Exam Vitals and nursing note reviewed.  Constitutional:      General: She is not in acute distress.    Appearance: Normal appearance. She is normal weight. She is not ill-appearing.  HENT:     Head: Normocephalic and atraumatic.     Mouth/Throat:     Mouth: Mucous membranes are moist.     Pharynx: Oropharynx is clear.  Eyes:     Extraocular Movements: Extraocular movements intact.     Pupils: Pupils are equal, round, and reactive to light.  Cardiovascular:     Rate and Rhythm: Normal rate and regular rhythm.     Pulses: Normal pulses.     Heart sounds: Normal heart sounds.  Pulmonary:     Effort: Pulmonary effort is normal.     Breath sounds: Normal breath sounds.  No wheezing, rhonchi or rales.  Musculoskeletal:         General: Normal range of motion.  Skin:    General: Skin is warm and dry.     Comments: Right foot dorsum): Erythematous moderate soft tissue swelling noted, warm hot to touch, PT & DP +1 bilaterally-please see images below  Neurological:     General: No focal deficit present.     Mental Status: She is alert and oriented to person, place, and time. Mental status is at baseline.  Psychiatric:        Mood and Affect: Mood normal.        Behavior: Behavior normal.        Thought Content: Thought content normal.         UC Treatments / Results  Labs (all labs ordered are listed, but only abnormal results are displayed) Labs Reviewed - No data to display  EKG   Radiology DG Foot Complete Right Result Date: 04/06/2024 CLINICAL DATA:  Right foot pain for 3 days EXAM: RIGHT FOOT COMPLETE - 3+ VIEW COMPARISON:  None Available. FINDINGS: Bifid medial first digit sesamoid. Spurring of the first metatarsal head at the articulation with the sesamoids. No fracture or malalignment. Small type 1 accessory navicular, about 4 mm proximal to the main navicular, correlate with any tenderness in this vicinity along the distal tibialis posterior tendon. Suspected subcutaneous edema in the distal calf. Mild dorsal subcutaneous edema along the forefoot. IMPRESSION: 1. Small type 1 accessory navicular, about 4 mm proximal to the main navicular, correlate with any tenderness in this vicinity along the distal tibialis posterior tendon. 2. Spurring of the first metatarsal head at the articulation with the sesamoids. 3. Suspected subcutaneous edema in the distal calf and dorsal forefoot. Electronically Signed   By: Ryan Salvage M.D.   On: 04/06/2024 12:38    Procedures Procedures (including critical care time)  Medications Ordered in UC Medications - No data to display  Initial Impression / Assessment and Plan / UC Course  I have reviewed the triage vital signs and the nursing notes.  Pertinent labs  & imaging results that were available during my care of the patient were reviewed by me and considered in my medical decision making (see chart for details).     MDM: 1.  Right foot pain-right foot x-ray results revealed above; 2.  Acute idiopathic gout of right-Rx'd colchicine  0.6 mg tablet: Take 2 tabs p.o. now may repeat 1 tab in 1 to 2 hours if pain still persist do not exceed more than 3 tablets in 24 hours. Advised patient take medication as directed with food.  Advised patient may take 2 tablets now and may repeat 1 tablet in 1 to 2 hours if pain still persist and right foot.  Advised patient do not exceed 3 tablets in 24 hours.  Advised if symptoms worsen and/or unresolved please follow-up with your PCP, [facility], [facility], or here for further evaluation.  Patient discharged home, hemodynamically stable Final Clinical Impressions(s) / UC Diagnoses   Final diagnoses:  Foot pain, right  Acute idiopathic gout of right foot     Discharge Instructions      Advised patient take medication as directed with food.  Advised patient may take 2 tablets now and may repeat 1 tablet in 1 to 2 hours if pain still persist and right foot.  Advised patient do not exceed 3 tablets in 24 hours.  Advised if symptoms worsen and/or unresolved  please follow-up with your PCP, [facility], [facility], or here for further evaluation.     ED Prescriptions     Medication Sig Dispense Auth. Provider   colchicine  0.6 MG tablet Take 2 tabs p.o. now, may repeat 1 tab in 1 to 2 hours if pain is still present.  Do not exceed more than 3 tablets in 24 hours. 30 tablet Bertine Schlottman, FNP      PDMP not reviewed this encounter.   Teddy Sharper, FNP 04/06/24 1358

## 2024-04-06 NOTE — ED Triage Notes (Signed)
 Pt c/o RT foot pain x 3 days. Started off as a tingling sensation and now swollen, red and hot to touch. Warm soaks and tylenol  prn with no relief.

## 2024-04-07 ENCOUNTER — Ambulatory Visit (HOSPITAL_COMMUNITY): Payer: Self-pay

## 2024-04-07 ENCOUNTER — Telehealth: Payer: Self-pay

## 2024-04-07 NOTE — Telephone Encounter (Signed)
Called to check on patient. Left voicemail.

## 2024-04-10 NOTE — Telephone Encounter (Signed)
 Called to check on patient. Pt reports only having to take 3 doses of medication for symptoms to greatly improve. Informed patient that if she needs to take any more to hold her atorvastatin  per Reagan NP and follow up. Pt restates understanding.

## 2024-04-11 ENCOUNTER — Encounter: Payer: Self-pay | Admitting: Sports Medicine

## 2024-05-31 ENCOUNTER — Other Ambulatory Visit: Payer: Self-pay | Admitting: Family Medicine

## 2024-05-31 DIAGNOSIS — Z7689 Persons encountering health services in other specified circumstances: Secondary | ICD-10-CM

## 2024-06-22 NOTE — Progress Notes (Signed)
 Rachel Carr                                          MRN: [id number]   06/22/2024   The [facility] Quality Team Specialist reviewed this patient medical record for the purposes of chart review for care gap closure. The following were reviewed: chart review for care gap closure-controlling blood pressure.    [facility] Quality Team

## 2024-06-28 ENCOUNTER — Other Ambulatory Visit: Payer: Self-pay | Admitting: Family Medicine

## 2024-06-28 DIAGNOSIS — Z1231 Encounter for screening mammogram for malignant neoplasm of breast: Secondary | ICD-10-CM

## 2024-07-13 ENCOUNTER — Ambulatory Visit

## 2024-07-13 DIAGNOSIS — Z1231 Encounter for screening mammogram for malignant neoplasm of breast: Secondary | ICD-10-CM

## 2024-07-21 ENCOUNTER — Ambulatory Visit: Payer: Self-pay | Admitting: Family Medicine

## 2024-07-21 NOTE — Progress Notes (Signed)
 Please call patient. Normal mammogram.  Repeat in 1 year.

## 2024-07-29 ENCOUNTER — Other Ambulatory Visit: Payer: Self-pay | Admitting: Family Medicine

## 2024-07-29 DIAGNOSIS — Z7689 Persons encountering health services in other specified circumstances: Secondary | ICD-10-CM

## 2024-08-24 ENCOUNTER — Other Ambulatory Visit: Payer: Self-pay | Admitting: Family Medicine

## 2024-08-24 DIAGNOSIS — M5416 Radiculopathy, lumbar region: Secondary | ICD-10-CM

## 2024-08-25 NOTE — Telephone Encounter (Signed)
 Please call pt and have her schedule a f/u for bp/ifg for medication refills. She  is overdue. Thanks.

## 2024-08-28 NOTE — Progress Notes (Signed)
 Rachel Carr                                          MRN: [id number]   08/28/2024   The [facility] Quality Team Specialist reviewed this patient medical record for the purposes of chart review for care gap closure. The following were reviewed: chart review for care gap closure-controlling blood pressure.    [facility] Quality Team

## 2024-08-29 ENCOUNTER — Ambulatory Visit: Admitting: Family Medicine

## 2024-08-29 ENCOUNTER — Encounter: Payer: Self-pay | Admitting: Family Medicine

## 2024-08-29 ENCOUNTER — Other Ambulatory Visit: Payer: Self-pay | Admitting: Family Medicine

## 2024-08-29 VITALS — BP 132/82 | HR 66 | Ht 61.0 in | Wt 183.0 lb

## 2024-08-29 DIAGNOSIS — K2270 Barrett's esophagus without dysplasia: Secondary | ICD-10-CM | POA: Diagnosis not present

## 2024-08-29 DIAGNOSIS — M5416 Radiculopathy, lumbar region: Secondary | ICD-10-CM

## 2024-08-29 DIAGNOSIS — Z23 Encounter for immunization: Secondary | ICD-10-CM

## 2024-08-29 DIAGNOSIS — Z7689 Persons encountering health services in other specified circumstances: Secondary | ICD-10-CM | POA: Diagnosis not present

## 2024-08-29 DIAGNOSIS — R7301 Impaired fasting glucose: Secondary | ICD-10-CM

## 2024-08-29 DIAGNOSIS — J439 Emphysema, unspecified: Secondary | ICD-10-CM

## 2024-08-29 LAB — POCT GLYCOSYLATED HEMOGLOBIN (HGB A1C): Hemoglobin A1C: 5.8 % — AB (ref 4.0–5.6)

## 2024-08-29 MED ORDER — GABAPENTIN 100 MG PO TABS: ORAL_TABLET | ORAL | 0 refills | Status: AC

## 2024-08-29 MED ORDER — ALBUTEROL SULFATE HFA 108 (90 BASE) MCG/ACT IN AERS: INHALATION_SPRAY | RESPIRATORY_TRACT | 99 refills | Status: AC

## 2024-08-29 NOTE — Assessment & Plan Note (Signed)
" °  Barrett's esophagus Managed with pantoprazole . - Continue pantoprazole . "

## 2024-08-29 NOTE — Assessment & Plan Note (Signed)
" °  Impaired fasting glucose (pre-diabetes) A1c at 5.8%, indicating well-managed pre-diabetes. - Continue monitoring A1c twice a year. "

## 2024-08-29 NOTE — Progress Notes (Signed)
 "  Established Patient Office Visit  Patient ID: Rachel Carr, female    DOB: [birth year]  Age: 74 y.o. MRN: [id number] PCP: Alvan Rachel BIRCH, MD  Chief Complaint  Patient presents with   IFG   Hypertension    Subjective:     HPI  Discussed the use of AI scribe software for clinical note transcription with the patient, who gave verbal consent to proceed.  History of Present Illness Rachel Carr is a 74 year old female who presents with worsening sciatica pain and ineffective medication management.  Left-sided sciatica - Worsening pain localized to the left side - Significant impact on ability to engage in physical activities, including walking and gardening - Gabapentin  600 mg, initially prescribed by orthopedist, no longer provides effective pain relief  Medication management and adverse effects - Gabapentin  may be contributing to weight gain - Topamax  taken for appetite suppression, current dose 25 mg, considering increase to 50 mg due to lack of efficacy - Unable to afford GLP-1 medications for weight loss due to high cost  Gastrointestinal history - Barrett's esophagus, managed with pantoprazole   Metabolic status - A1c 5.8, consistent with prediabetes, stable since summer - Diet low in processed foods, primarily vegetables and limited meat - Uses olive oil for cooking, minimal butter intake, prefers homemade jar butter  Respiratory status - Has not used inhaler in years, questions potency - No recent respiratory infections or wheezing  History of herpes zoster - Previous shingles outbreak several years ago, located under the eyes     ROS    Objective:     BP 132/82   Pulse 66   Ht 5' 1 (1.549 m)   Wt 183 lb (83 kg)   SpO2 98%   BMI 34.58 kg/m    Physical Exam Vitals and nursing note reviewed.  Constitutional:      Appearance: Normal appearance.  HENT:     Head: Normocephalic and atraumatic.  Eyes:      Conjunctiva/sclera: Conjunctivae normal.  Cardiovascular:     Rate and Rhythm: Normal rate and regular rhythm.  Pulmonary:     Effort: Pulmonary effort is normal.     Breath sounds: Normal breath sounds.  Skin:    General: Skin is warm and dry.  Neurological:     Mental Status: She is alert.  Psychiatric:        Mood and Affect: Mood normal.      Results for orders placed or performed in visit on 08/29/24  POCT HgB A1C  Result Value Ref Range   Hemoglobin A1C 5.8 (A) 4.0 - 5.6 %   HbA1c POC (<> result, manual entry)     HbA1c, POC (prediabetic range)     HbA1c, POC (controlled diabetic range)        The 10-year ASCVD risk score (Arnett DK, et al., 2019) is: 16.8%    Assessment & Plan:   Problem List Items Addressed This Visit       Respiratory   Pulmonary emphysema (HCC)   She says she really has not had to use her albuterol  in quite some time she thinks the albuterol  she has at home is expired.  She would like to keep 1 on file so we will send 1 to the pharmacy for them to hold on file      Relevant Medications   albuterol  (VENTOLIN  HFA) 108 (90 Base) MCG/ACT inhaler     Digestive   Barrett's esophagus (Chronic)    Barrett's  esophagus Managed with pantoprazole . - Continue pantoprazole .      Relevant Orders   CMP14+EGFR     Endocrine   IFG (impaired fasting glucose) - Primary    Impaired fasting glucose (pre-diabetes) A1c at 5.8%, indicating well-managed pre-diabetes. - Continue monitoring A1c twice a year.      Relevant Orders   POCT HgB A1C (Completed)   CMP14+EGFR     Nervous and Auditory   Left lumbar radiculopathy   Relevant Medications   gabapentin  (NEURONTIN ) 100 MG tablet     Other   Encounter for weight management   Other Visit Diagnoses       Encounter for immunization       Relevant Orders   Flu vaccine HIGH DOSE PF(Fluzone Trivalent) (Completed)       Assessment and Plan Assessment & Plan Left lumbar radiculopathy  (sciatica) Chronic condition with worsening symptoms. Gabapentin  ineffective and may cause weight gain. MRI and orthopedist follow-up scheduled. Discussed potential surgical intervention, preferring to avoid fusion surgery. - Initiated gabapentin  tapering. - Continue with MRI and orthopedist follow-up. - Encouraged low inflammatory diet. - Discussed surgical options with orthopedist.      Return in about 6 months (around 02/26/2025).    Rachel Byars, MD [facility]   "

## 2024-08-29 NOTE — Assessment & Plan Note (Signed)
 She says she really has not had to use her albuterol  in quite some time she thinks the albuterol  she has at home is expired.  She would like to keep 1 on file so we will send 1 to the pharmacy for them to hold on file

## 2024-08-29 NOTE — Telephone Encounter (Signed)
 Per [facility] - the gabapentin  rx requires a prior authorization. Thanks in advance.

## 2024-08-30 ENCOUNTER — Other Ambulatory Visit (HOSPITAL_COMMUNITY): Payer: Self-pay

## 2024-08-30 ENCOUNTER — Ambulatory Visit: Payer: Self-pay | Admitting: Family Medicine

## 2024-08-30 ENCOUNTER — Telehealth: Payer: Self-pay

## 2024-08-30 LAB — CMP14+EGFR
ALT: 11 IU/L (ref 0–32)
AST: 17 IU/L (ref 0–40)
Albumin: 3.9 g/dL (ref 3.8–4.8)
Alkaline Phosphatase: 116 IU/L (ref 49–135)
BUN/Creatinine Ratio: 13 (ref 12–28)
BUN: 10 mg/dL (ref 8–27)
Bilirubin Total: 0.6 mg/dL (ref 0.0–1.2)
CO2: 23 mmol/L (ref 20–29)
Calcium: 8.8 mg/dL (ref 8.7–10.3)
Chloride: 102 mmol/L (ref 96–106)
Creatinine, Ser: 0.77 mg/dL (ref 0.57–1.00)
Globulin, Total: 2.1 g/dL (ref 1.5–4.5)
Glucose: 81 mg/dL (ref 70–99)
Potassium: 4.6 mmol/L (ref 3.5–5.2)
Sodium: 139 mmol/L (ref 134–144)
Total Protein: 6 g/dL (ref 6.0–8.5)
eGFR: 81 mL/min/1.73

## 2024-08-30 NOTE — Telephone Encounter (Signed)
 Please review the prior authorization notice regarding the gabapentin  rx. PA authorization will not be approved. Requesting a new rx to be sent to the pharmacy. Thanks in advance.

## 2024-08-30 NOTE — Telephone Encounter (Signed)
 Patient's plan will not cover tablets, as they are non formulary/plan benefit exclusion. Capsules are covered on plan, however the plan caps at a daily limit of 6 capsules. Patient will likely need to pay cash/use good RX for this 1 time fill as PA authorization will not be approved. Capsules are significantly cheaper than tablets. If appropriate, patient can get 235 capsules of Gabapentin  100 mg [facility] for around $30.

## 2024-08-30 NOTE — Progress Notes (Signed)
 Your lab work is within acceptable range and there are no concerning findings.   ?

## 2024-09-01 ENCOUNTER — Other Ambulatory Visit: Payer: Self-pay | Admitting: Family Medicine

## 2024-09-01 DIAGNOSIS — M5416 Radiculopathy, lumbar region: Secondary | ICD-10-CM

## 2024-09-01 NOTE — Telephone Encounter (Signed)
 Please notify pt of info below regarding GoodRx

## 2024-09-01 NOTE — Telephone Encounter (Signed)
 Patient advised

## 2024-09-04 ENCOUNTER — Ambulatory Visit

## 2025-02-26 ENCOUNTER — Ambulatory Visit: Admitting: Family Medicine

## 2025-03-08 ENCOUNTER — Ambulatory Visit
